# Patient Record
Sex: Female | Born: 1950 | Race: White | Hispanic: No | Smoking: Former smoker
Health system: Southern US, Community
[De-identification: ages and names within clinical notes are randomized; demographics above are authoritative.]

## PROBLEM LIST (undated history)

## (undated) DIAGNOSIS — G709 Myoneural disorder, unspecified: Secondary | ICD-10-CM

## (undated) DIAGNOSIS — I10 Essential (primary) hypertension: Secondary | ICD-10-CM

## (undated) DIAGNOSIS — C20 Malignant neoplasm of rectum: Secondary | ICD-10-CM

## (undated) DIAGNOSIS — E119 Type 2 diabetes mellitus without complications: Secondary | ICD-10-CM

## (undated) DIAGNOSIS — J449 Chronic obstructive pulmonary disease, unspecified: Secondary | ICD-10-CM

## (undated) HISTORY — DX: Malignant neoplasm of rectum: C20

## (undated) HISTORY — DX: Type 2 diabetes mellitus without complications: E11.9

---

## 2005-08-09 DIAGNOSIS — C801 Malignant (primary) neoplasm, unspecified: Secondary | ICD-10-CM

## 2005-08-09 HISTORY — DX: Malignant (primary) neoplasm, unspecified: C80.1

## 2017-08-09 HISTORY — PX: TUMOR REMOVAL: SHX12

## 2018-08-09 DIAGNOSIS — I219 Acute myocardial infarction, unspecified: Secondary | ICD-10-CM

## 2018-08-09 HISTORY — DX: Acute myocardial infarction, unspecified: I21.9

## 2020-07-23 LAB — LIPID PANEL
Cholesterol: 192 (ref 0–200)
HDL: 35 (ref 35–70)
Triglycerides: 526 — AB (ref 40–160)

## 2020-07-23 LAB — BASIC METABOLIC PANEL WITH GFR
BUN: 12 (ref 4–21)
Creatinine: 0.7 (ref 0.5–1.1)

## 2020-07-23 LAB — TSH: TSH: 0.68 (ref 0.41–5.90)

## 2020-07-23 LAB — MICROALBUMIN, URINE: Microalb, Ur: 8.3

## 2020-07-23 LAB — COMPREHENSIVE METABOLIC PANEL WITH GFR: Calcium: 10 (ref 8.7–10.7)

## 2020-07-23 LAB — HEMOGLOBIN A1C: Hemoglobin A1C: 8.2

## 2020-11-26 ENCOUNTER — Ambulatory Visit (INDEPENDENT_AMBULATORY_CARE_PROVIDER_SITE_OTHER): Payer: Medicare Other | Admitting: "Endocrinology

## 2020-11-26 ENCOUNTER — Encounter: Payer: Self-pay | Admitting: "Endocrinology

## 2020-11-26 ENCOUNTER — Other Ambulatory Visit: Payer: Self-pay

## 2020-11-26 VITALS — BP 110/78 | HR 80 | Ht 60.0 in | Wt 153.4 lb

## 2020-11-26 DIAGNOSIS — E1165 Type 2 diabetes mellitus with hyperglycemia: Secondary | ICD-10-CM

## 2020-11-26 DIAGNOSIS — E782 Mixed hyperlipidemia: Secondary | ICD-10-CM

## 2020-11-26 DIAGNOSIS — I10 Essential (primary) hypertension: Secondary | ICD-10-CM

## 2020-11-26 LAB — POCT GLYCOSYLATED HEMOGLOBIN (HGB A1C): HbA1c, POC (controlled diabetic range): 12.1 % — AB (ref 0.0–7.0)

## 2020-11-26 MED ORDER — GNP TRUE METRIX GLUCOSE STRIPS VI STRP: ORAL_STRIP | 2 refills | Status: DC

## 2020-11-26 NOTE — Patient Instructions (Signed)

## 2020-11-26 NOTE — Progress Notes (Signed)
Endocrinology Consult Note       11/26/2020, 4:53 PM   Subjective:    Patient ID: Kimberly Graves, female    DOB: [birth year].  Kimberly Graves is being seen in consultation for management of currently uncontrolled symptomatic diabetes requested by  Coolidge Breeze, FNP.   Past Medical History:  Diagnosis Date  . Diabetes mellitus, type II ([place])   . Rectal cancer [facility])     History reviewed. No pertinent surgical history.  Social History   Socioeconomic History  . Marital status: Married    Spouse name: Not on file  . Number of children: Not on file  . Years of education: Not on file  . Highest education level: Not on file  Occupational History  . Not on file  Tobacco Use  . Smoking status: Never Smoker  . Smokeless tobacco: Not on file  Vaping Use  . Vaping Use: Never used  Substance and Sexual Activity  . Alcohol use: Not Currently  . Drug use: Never  . Sexual activity: Not on file  Other Topics Concern  . Not on file  Social History Narrative  . Not on file   Social Determinants of Health   Financial Resource Strain: Not on file  Food Insecurity: Not on file  Transportation Needs: Not on file  Physical Activity: Not on file  Stress: Not on file  Social Connections: Not on file    Family History  Problem Relation Age of Onset  . Hypertension Mother   . Cancer Mother   . Hypertension Father     Outpatient Encounter Medications as of 11/26/2020  Medication Sig  . glucose blood (GNP TRUE METRIX GLUCOSE STRIPS) test strip Test 4 times a day  . acetaminophen (TYLENOL) 500 MG tablet Take 2 tablets by mouth 2 (two) times daily.  Marland Kitchen atorvastatin (LIPITOR) 80 MG tablet Take 1 tablet by mouth daily.  . busPIRone (BUSPAR) 10 MG tablet Take 10 mg by mouth 2 (two) times daily.  . carvedilol (COREG) 3.125 MG tablet Take 3.125 mg by mouth 2 (two) times daily.  . citalopram (CELEXA) 20 MG  tablet Take 20 mg by mouth daily.  . fenofibrate (TRICOR) 145 MG tablet Take 2 tablets by mouth daily.  Marland Kitchen gabapentin (NEURONTIN) 600 MG tablet Take 1 tablet by mouth 2 (two) times daily.  Marland Kitchen lisinopril (ZESTRIL) 5 MG tablet Take 5 mg by mouth daily.  . metFORMIN (GLUCOPHAGE-XR) 500 MG 24 hr tablet Take 500 mg by mouth daily.  . niacin 500 MG tablet Take 1 mg by mouth daily.  Marland Kitchen omeprazole (PRILOSEC) 20 MG capsule Take 1 capsule by mouth daily.  . pantoprazole (PROTONIX) 40 MG tablet Take by mouth 2 (two) times daily.  . TRULICITY 1.5 TH/4.3OO SOPN Inject 0.5 mLs into the skin every 7 (seven) days.   No facility-administered encounter medications on file as of 11/26/2020.    ALLERGIES: Allergies  Allergen Reactions  . Codeine   . Hydrocodone   . Lorcet [Hydrocodone-Acetaminophen]   . Percocet [Oxycodone-Acetaminophen]     VACCINATION STATUS:  There is no immunization history on file for this patient.  Diabetes  She presents for her initial diabetic visit. She has type 2 diabetes mellitus. Onset time: She was diagnosed at approximate age of 4 years. Her disease course has been worsening. There are no hypoglycemic associated symptoms. Associated symptoms include polydipsia and polyuria. There are no hypoglycemic complications. Symptoms are worsening. There are no diabetic complications. Risk factors for coronary artery disease include diabetes mellitus, dyslipidemia, sedentary lifestyle, post-menopausal and tobacco exposure. Current diabetic treatments: She is currently on metformin 500 mg p.o. twice daily, recently added Trulicity at 1.5 mg weekly. Her weight is fluctuating minimally. She is following a generally unhealthy diet. When asked about meal planning, she reported none. She has had a previous visit with a dietitian. She rarely participates in exercise. Her home blood glucose trend is increasing steadily. (Her point-of-care A1c is 12.1% consistent with uncontrolled chronic  hyperglycemia.  After initiation of Trulicity however, she is so improved glycemic profile.  Picture of her recent log on her phone shows fasting blood glucose ranging between 110-137.) An ACE inhibitor/angiotensin II receptor blocker is not being taken. She does not see a podiatrist.Eye exam is not current.  Hyperlipidemia This is a chronic problem. Exacerbating diseases include diabetes. Current antihyperlipidemic treatment includes fibric acid derivatives and statins. Risk factors for coronary artery disease include diabetes mellitus, dyslipidemia, hypertension, post-menopausal and a sedentary lifestyle.  Hypertension This is a chronic problem. The current episode started more than 1 year ago. Risk factors for coronary artery disease include diabetes mellitus and dyslipidemia. Past treatments include beta blockers.     Review of Systems  Endocrine: Positive for polydipsia and polyuria.    Objective:    Vitals with BMI 11/26/2020  Height 5\' 0"   Weight 153 lbs 6 oz  BMI 35.36  Systolic 144  Diastolic 78  Pulse 80    BP 110/78   Pulse 80   Ht 5' (1.524 m)   Wt 153 lb 6.4 oz (69.6 kg)   BMI 29.96 kg/m   Wt Readings from Last 3 Encounters:  11/26/20 153 lb 6.4 oz (69.6 kg)     Physical Exam    CMP ( most recent) CMP     Component Value Date/Time   BUN 12 07/23/2020 0000   CREATININE 0.7 07/23/2020 0000   CALCIUM 10.0 07/23/2020 0000     Diabetic Labs (most recent): Lab Results  Component Value Date   HGBA1C 12.1 (A) 11/26/2020   HGBA1C 8.2 07/23/2020    Lipid Panel     Component Value Date/Time   CHOL 192 07/23/2020 0000   TRIG 526 (A) 07/23/2020 0000   HDL 35 07/23/2020 0000      Assessment & Plan:   1. Type 2 diabetes mellitus with hyperglycemia, unspecified whether long term insulin use (HCC) - Kimberly Graves has currently uncontrolled symptomatic type 2 DM since  70 years of age,  with most recent A1c of 12.1 %. Recent labs reviewed. - I had a long  discussion with her about the progressive nature of diabetes and the pathology behind its complications. -She does not report gross complications from her diabetes, however she remains at a high risk for more acute and chronic complications which include CAD, CVA, CKD, retinopathy, and neuropathy. These are all discussed in detail with her.  - I have counseled her on diet  and weight management  by adopting a carbohydrate restricted/protein rich diet. Patient is encouraged to switch to  unprocessed or minimally processed     complex starch and increased protein intake (animal or  plant source), fruits, and vegetables. -  she is advised to stick to a routine mealtimes to eat 3 meals  a day and avoid unnecessary snacks ( to snack only to correct hypoglycemia).   - she acknowledges that there is a room for improvement in her food and drink choices. - Suggestion is made for her to avoid simple carbohydrates  from her diet including Cakes, Sweet Desserts, Ice Cream, Soda (diet and regular), Sweet Tea, Candies, Chips, Cookies, Store Bought Juices, Alcohol in Excess of  1-2 drinks a day, Artificial Sweeteners,  Coffee Creamer, and "Sugar-free" Products. This will help patient to have more stable blood glucose profile and potentially avoid unintended weight gain.  - she will be scheduled with Jearld Fenton, RDN, CDE for diabetes education.  - I have approached her with the following individualized plan to manage  her diabetes and patient agrees:   - she is supposed to initiate  strict monitoring of glucose 4 times a day-before meals and at bedtime. -Should be considered for at least basal insulin if she returns with significantly above target glycemic profile. - she is encouraged to call clinic for blood glucose levels less than 70 or above 300 mg /dl. -She seems to be benefiting from recent addition of Trulicity.  She is advised to continue Trulicity 1.5 mg subcutaneously weekly. -I discussed and increased  her metformin to 500 mg p.o. twice daily. -If she returns with only mild hyperglycemia, she will be considered for addition of low-dose glipizide in lieu of insulin. - Specific targets for  A1c;  LDL, HDL,  and Triglycerides were discussed with the patient.  2) Blood Pressure /Hypertension:  her blood pressure is  controlled to target.   she is advised to continue her current medications including carvedilol 3.25 mg p.o. twice daily . 3) Lipids/Hyperlipidemia:   Review of her recent lipid panel showed un controlled hypertriglyceridemia at 526.  She is currently on fenofibrate 145 mg p.o. daily, as well as atorvastatin 80 mg p.o. nightly.  She is advised to continue on both.   4)  Weight/Diet:  Body mass index is 29.96 kg/m.  -      she is  a candidate for some weight loss, recently experienced unintended weight loss.  I discussed with her the fact that loss of 5 - 10% of her  current body weight will have the most impact on her diabetes management.  Exercise, and detailed carbohydrates information provided  -  detailed on discharge instructions.  5) Chronic Care/Health Maintenance:  -she  is on  Statin medications and  is encouraged to initiate and continue to follow up with Ophthalmology, Dentist,  Podiatrist at least yearly or according to recommendations, and advised to   stay away from smoking. I have recommended yearly flu vaccine and pneumonia vaccine at least every 5 years; moderate intensity exercise for up to 150 minutes weekly; and  sleep for at least 7 hours a day.  - she is  advised to maintain close follow up with Coolidge Breeze, FNP for primary care needs, as well as her other providers for optimal and coordinated care.   I spent 65 minutes in the care of the patient today including review of labs from [place], Lipids, Thyroid Function, Hematology (current and previous including abstractions from other facilities); face-to-face time discussing  her blood glucose readings/logs, discussing  hypoglycemia and hyperglycemia episodes and symptoms, medications doses, her options of short and long term treatment based on the latest standards of  care / guidelines;  discussion about incorporating lifestyle medicine;  and documenting the encounter.     Please refer to Patient Instructions for Blood Glucose Monitoring and Insulin/Medications Dosing Guide"  in media tab for additional information. Please  also refer to " Patient Self Inventory" in the Media  tab for reviewed elements of pertinent patient history.  Kimberly Graves participated in the discussions, expressed understanding, and voiced agreement with the above plans.  All questions were answered to her satisfaction. she is encouraged to contact clinic should she have any questions or concerns prior to her return visit.   Follow up plan: - Return in about 10 days (around 12/06/2020) for F/U with Meter and Logs Only - no Labs.  Glade Lloyd, MD [facility] [address] [place], [place] [address] Phone: [phone]  Fax: [phone]    11/26/2020, 4:53 PM  This note was partially dictated with voice recognition software. Similar sounding words can be transcribed inadequately or may not  be corrected upon review.

## 2020-12-09 ENCOUNTER — Ambulatory Visit: Payer: Medicare Other | Admitting: "Endocrinology

## 2020-12-10 ENCOUNTER — Ambulatory Visit: Payer: Medicare Other | Admitting: "Endocrinology

## 2020-12-19 ENCOUNTER — Encounter: Payer: Self-pay | Admitting: "Endocrinology

## 2020-12-19 ENCOUNTER — Ambulatory Visit (INDEPENDENT_AMBULATORY_CARE_PROVIDER_SITE_OTHER): Payer: Medicare Other | Admitting: "Endocrinology

## 2020-12-19 ENCOUNTER — Other Ambulatory Visit: Payer: Self-pay

## 2020-12-19 VITALS — BP 124/81 | HR 70 | Ht 60.0 in | Wt 154.4 lb

## 2020-12-19 DIAGNOSIS — E782 Mixed hyperlipidemia: Secondary | ICD-10-CM | POA: Diagnosis not present

## 2020-12-19 DIAGNOSIS — I10 Essential (primary) hypertension: Secondary | ICD-10-CM

## 2020-12-19 DIAGNOSIS — E1165 Type 2 diabetes mellitus with hyperglycemia: Secondary | ICD-10-CM

## 2020-12-19 MED ORDER — ACCU-CHEK GUIDE ME W/DEVICE KIT: 1.0000 | PACK | 0 refills | Status: DC

## 2020-12-19 MED ORDER — ACCU-CHEK GUIDE VI STRP: ORAL_STRIP | 2 refills | Status: DC

## 2020-12-19 NOTE — Progress Notes (Signed)
12/19/2020, 4:54 PM  Endocrinology follow-up note   Subjective:    Patient ID: Kimberly Graves, female    DOB: 21-Nov-1950.  Kimberly Graves is being seen in follow-up after she was seen in consultation for management of currently uncontrolled symptomatic diabetes requested by  Coolidge Breeze, FNP.   Past Medical History:  Diagnosis Date  . Diabetes mellitus, type II ([place])   . Rectal cancer [facility])     History reviewed. No pertinent surgical history.  Social History   Socioeconomic History  . Marital status: Married    Spouse name: Not on file  . Number of children: Not on file  . Years of education: Not on file  . Highest education level: Not on file  Occupational History  . Not on file  Tobacco Use  . Smoking status: Never Smoker  . Smokeless tobacco: Not on file  Vaping Use  . Vaping Use: Never used  Substance and Sexual Activity  . Alcohol use: Not Currently  . Drug use: Never  . Sexual activity: Not on file  Other Topics Concern  . Not on file  Social History Narrative  . Not on file   Social Determinants of Health   Financial Resource Strain: Not on file  Food Insecurity: Not on file  Transportation Needs: Not on file  Physical Activity: Not on file  Stress: Not on file  Social Connections: Not on file    Family History  Problem Relation Age of Onset  . Hypertension Mother   . Cancer Mother   . Hypertension Father     Outpatient Encounter Medications as of 12/19/2020  Medication Sig  . Blood Glucose Monitoring Suppl (ACCU-CHEK GUIDE ME) w/Device KIT 1 Piece by Does not apply route as directed.  Marland Kitchen glucose blood (ACCU-CHEK GUIDE) test strip Use as instructed  . acetaminophen (TYLENOL) 500 MG tablet Take 2 tablets by mouth 2 (two) times daily.  Marland Kitchen atorvastatin (LIPITOR) 80 MG tablet Take 1 tablet by mouth daily.  . busPIRone (BUSPAR) 10 MG tablet Take 10 mg by mouth 2 (two)  times daily.  . carvedilol (COREG) 3.125 MG tablet Take 3.125 mg by mouth 2 (two) times daily.  . citalopram (CELEXA) 20 MG tablet Take 20 mg by mouth daily.  . fenofibrate (TRICOR) 145 MG tablet Take 2 tablets by mouth daily.  Marland Kitchen gabapentin (NEURONTIN) 600 MG tablet Take 1 tablet by mouth 2 (two) times daily.  Marland Kitchen lisinopril (ZESTRIL) 5 MG tablet Take 5 mg by mouth daily.  . metFORMIN (GLUCOPHAGE-XR) 500 MG 24 hr tablet Take 500 mg by mouth daily.  . niacin 500 MG tablet Take 1 mg by mouth daily.  Marland Kitchen omeprazole (PRILOSEC) 20 MG capsule Take 1 capsule by mouth daily.  . pantoprazole (PROTONIX) 40 MG tablet Take by mouth 2 (two) times daily.  . TRULICITY 1.5 XY/8.1VW SOPN Inject 0.5 mLs into the skin every 7 (seven) days.  . [DISCONTINUED] glucose blood (GNP TRUE METRIX GLUCOSE STRIPS) test strip Test 4 times a day   No facility-administered encounter medications on file as of 12/19/2020.    ALLERGIES: Allergies  Allergen Reactions  . Codeine   . Hydrocodone   .  Lorcet [Hydrocodone-Acetaminophen]   . Percocet [Oxycodone-Acetaminophen]     VACCINATION STATUS:  There is no immunization history on file for this patient.  Diabetes She presents for her follow-up diabetic visit. She has type 2 diabetes mellitus. Onset time: She was diagnosed at approximate age of 70 years. Her disease course has been improving. There are no hypoglycemic associated symptoms. Pertinent negatives for diabetes include no polydipsia and no polyuria. There are no hypoglycemic complications. Symptoms are worsening. There are no diabetic complications. Risk factors for coronary artery disease include diabetes mellitus, dyslipidemia, sedentary lifestyle, post-menopausal and tobacco exposure. Current diabetic treatments: She is currently on metformin 500 mg p.o. twice daily, recently added Trulicity at 1.5 mg weekly. Her weight is fluctuating minimally. She is following a generally unhealthy diet. When asked about meal  planning, she reported none. She has had a previous visit with a dietitian. She rarely participates in exercise. Her home blood glucose trend is decreasing steadily. Her breakfast blood glucose range is generally 130-140 mg/dl. Her bedtime blood glucose range is generally 130-140 mg/dl. Her overall blood glucose range is 130-140 mg/dl. (Despite her recent point-of-care A1c of 12.1%, she presents with near target glycemic profile averaging between 100 to 135 mg..  She has no hypoglycemia.  She is currently on metformin and Trulicity.   ) An ACE inhibitor/angiotensin II receptor blocker is not being taken. She does not see a podiatrist.Eye exam is not current.  Hyperlipidemia This is a chronic problem. Exacerbating diseases include diabetes. Current antihyperlipidemic treatment includes fibric acid derivatives and statins. Risk factors for coronary artery disease include diabetes mellitus, dyslipidemia, hypertension, post-menopausal and a sedentary lifestyle.  Hypertension This is a chronic problem. The current episode started more than 1 year ago. Risk factors for coronary artery disease include diabetes mellitus and dyslipidemia. Past treatments include beta blockers.     Review of Systems  Endocrine: Negative for polydipsia and polyuria.    Objective:    Vitals with BMI 12/19/2020 11/26/2020  Height _0  _1   Weight 154 lbs 6 oz 153 lbs 6 oz  BMI 22.97 98.92  Systolic 119 417  Diastolic 81 78  Pulse 70 80    BP 124/81   Pulse 70   Ht 5' (1.524 m)   Wt 154 lb 6.4 oz (70 kg)   BMI 30.15 kg/m   Wt Readings from Last 3 Encounters:  12/19/20 154 lb 6.4 oz (70 kg)  11/26/20 153 lb 6.4 oz (69.6 kg)     Physical Exam    CMP ( most recent) CMP     Component Value Date/Time   BUN 12 07/23/2020 0000   CREATININE 0.7 07/23/2020 0000   CALCIUM 10.0 07/23/2020 0000     Diabetic Labs (most recent): Lab Results  Component Value Date   HGBA1C 12.1 (A) 11/26/2020   HGBA1C 8.2  07/23/2020    Lipid Panel     Component Value Date/Time   CHOL 192 07/23/2020 0000   TRIG 526 (A) 07/23/2020 0000   HDL 35 07/23/2020 0000      Assessment & Plan:   1. Type 2 diabetes mellitus with hyperglycemia, unspecified whether long term insulin use (HCC) - Kimberly Graves has currently uncontrolled symptomatic type 2 DM since  70 years of age.  Despite her recent point-of-care A1c of 12.1%, she presents with near target glycemic profile averaging between 100 to 135 mg..  She has no hypoglycemia.  She is currently on metformin and Trulicity.    - Recent  labs reviewed. - I had a long discussion with her about the progressive nature of diabetes and the pathology behind its complications. -She does not report gross complications from her diabetes, however she remains at a high risk for more acute and chronic complications which include CAD, CVA, CKD, retinopathy, and neuropathy. These are all discussed in detail with her.  - I have counseled her on diet  and weight management  by adopting a carbohydrate restricted/protein rich diet. Patient is encouraged to switch to  unprocessed or minimally processed     complex starch and increased protein intake (animal or plant source), fruits, and vegetables. -  she is advised to stick to a routine mealtimes to eat 3 meals  a day and avoid unnecessary snacks ( to snack only to correct hypoglycemia).   - she acknowledges that there is a room for improvement in her food and drink choices. - Suggestion is made for her to avoid simple carbohydrates  from her diet including Cakes, Sweet Desserts, Ice Cream, Soda (diet and regular), Sweet Tea, Candies, Chips, Cookies, Store Bought Juices, Alcohol in Excess of  1-2 drinks a day, Artificial Sweeteners,  Coffee Creamer, and "Sugar-free" Products, Lemonade. This will help patient to have more stable blood glucose profile and potentially avoid unintended weight gain.   - she will be scheduled with Jearld Fenton, RDN, CDE for diabetes education.  - I have approached her with the following individualized plan to manage  her diabetes and patient agrees:   -In light of her presentation with near target glycemic profile, she will not need prandial insulin for now. -She is willing and advised to continue monitoring blood glucose twice a day-daily before breakfast and at bedtime. - she is encouraged to call clinic for blood glucose levels less than 70 or above 200 mg /dl. -She seems to be benefiting from recent addition of Trulicity.  She is advised to continue Trulicity 1.5 mg subcutaneously weekly, metformin 500 mg p.o. twice daily.   - Specific targets for  A1c;  LDL, HDL,  and Triglycerides were discussed with the patient.  2) Blood Pressure /Hypertension:  her blood pressure is  controlled to target.   she is advised to continue her current blood pressure medication including carvedilol 3.25 mg p.o. twice daily.     3) Lipids/Hyperlipidemia:   Review of her recent lipid panel showed un controlled hypertriglyceridemia at 526.  She is currently on fenofibrate 145 mg p.o. daily, as well as atorvastatin 80 mg p.o. nightly.   She is advised to continue on both.   4)  Weight/Diet:  Body mass index is 30.15 kg/m.  -      she is  a candidate for some weight loss, recently experienced unintended weight loss.  I discussed with her the fact that loss of 5 - 10% of her  current body weight will have the most impact on her diabetes management.  Exercise, and detailed carbohydrates information provided  -  detailed on discharge instructions.  5) Chronic Care/Health Maintenance:  -she  is on  Statin medications and  is encouraged to initiate and continue to follow up with Ophthalmology, Dentist,  Podiatrist at least yearly or according to recommendations, and advised to   stay away from smoking. I have recommended yearly flu vaccine and pneumonia vaccine at least every 5 years; moderate intensity exercise for  up to 150 minutes weekly; and  sleep for at least 7 hours a day.  - she is  advised  to maintain close follow up with Coolidge Breeze, FNP for primary care needs, as well as her other providers for optimal and coordinated care.    I spent 41 minutes in the care of the patient today including review of labs from [place], Lipids, Thyroid Function, Hematology (current and previous including abstractions from other facilities); face-to-face time discussing  her blood glucose readings/logs, discussing hypoglycemia and hyperglycemia episodes and symptoms, medications doses, her options of short and long term treatment based on the latest standards of care / guidelines;  discussion about incorporating lifestyle medicine;  and documenting the encounter.    Please refer to Patient Instructions for Blood Glucose Monitoring and Insulin/Medications Dosing Guide"  in media tab for additional information. Please  also refer to " Patient Self Inventory" in the Media  tab for reviewed elements of pertinent patient history.  Kimberly Graves participated in the discussions, expressed understanding, and voiced agreement with the above plans.  All questions were answered to her satisfaction. she is encouraged to contact clinic should she have any questions or concerns prior to her return visit.    Follow up plan: - Return in about 3 months (around 03/21/2021) for F/U with Pre-visit Labs, Meter, Logs, A1c here.Glade Lloyd, MD [facility] [address] [place], [place] [address] Phone: [phone]  Fax: [phone]    12/19/2020, 4:54 PM  This note was partially dictated with voice recognition software. Similar sounding words can be transcribed inadequately or may not  be corrected upon review.

## 2020-12-19 NOTE — Patient Instructions (Signed)

## 2021-03-23 ENCOUNTER — Encounter (INDEPENDENT_AMBULATORY_CARE_PROVIDER_SITE_OTHER): Payer: Self-pay | Admitting: *Deleted

## 2021-03-25 ENCOUNTER — Ambulatory Visit: Payer: Medicare Other | Admitting: "Endocrinology

## 2021-07-30 ENCOUNTER — Ambulatory Visit (INDEPENDENT_AMBULATORY_CARE_PROVIDER_SITE_OTHER): Payer: Medicare Other | Admitting: Gastroenterology

## 2021-08-09 DIAGNOSIS — Z85118 Personal history of other malignant neoplasm of bronchus and lung: Secondary | ICD-10-CM | POA: Insufficient documentation

## 2021-10-24 ENCOUNTER — Encounter (HOSPITAL_COMMUNITY): Payer: Self-pay

## 2021-10-24 ENCOUNTER — Other Ambulatory Visit: Payer: Self-pay

## 2021-10-24 ENCOUNTER — Emergency Department (HOSPITAL_COMMUNITY): Payer: Medicare Other

## 2021-10-24 ENCOUNTER — Observation Stay (HOSPITAL_COMMUNITY)
Admission: EM | Admit: 2021-10-24 | Discharge: 2021-10-25 | Disposition: A | Discharge status: Home / Self Care | Payer: Medicare Other | Attending: Internal Medicine | Admitting: Internal Medicine

## 2021-10-24 DIAGNOSIS — E860 Dehydration: Secondary | ICD-10-CM | POA: Diagnosis not present

## 2021-10-24 DIAGNOSIS — W0110XA Fall on same level from slipping, tripping and stumbling with subsequent striking against unspecified object, initial encounter: Secondary | ICD-10-CM | POA: Insufficient documentation

## 2021-10-24 DIAGNOSIS — R55 Syncope and collapse: Secondary | ICD-10-CM | POA: Diagnosis present

## 2021-10-24 DIAGNOSIS — Z79899 Other long term (current) drug therapy: Secondary | ICD-10-CM | POA: Insufficient documentation

## 2021-10-24 DIAGNOSIS — Z85048 Personal history of other malignant neoplasm of rectum, rectosigmoid junction, and anus: Secondary | ICD-10-CM | POA: Insufficient documentation

## 2021-10-24 DIAGNOSIS — R0789 Other chest pain: Secondary | ICD-10-CM | POA: Insufficient documentation

## 2021-10-24 DIAGNOSIS — I10 Essential (primary) hypertension: Secondary | ICD-10-CM | POA: Diagnosis present

## 2021-10-24 DIAGNOSIS — Z7901 Long term (current) use of anticoagulants: Secondary | ICD-10-CM | POA: Insufficient documentation

## 2021-10-24 DIAGNOSIS — Z20822 Contact with and (suspected) exposure to covid-19: Secondary | ICD-10-CM | POA: Diagnosis not present

## 2021-10-24 DIAGNOSIS — R079 Chest pain, unspecified: Secondary | ICD-10-CM

## 2021-10-24 DIAGNOSIS — N390 Urinary tract infection, site not specified: Secondary | ICD-10-CM | POA: Diagnosis not present

## 2021-10-24 DIAGNOSIS — F39 Unspecified mood [affective] disorder: Secondary | ICD-10-CM | POA: Diagnosis not present

## 2021-10-24 DIAGNOSIS — Z7984 Long term (current) use of oral hypoglycemic drugs: Secondary | ICD-10-CM | POA: Insufficient documentation

## 2021-10-24 DIAGNOSIS — E1165 Type 2 diabetes mellitus with hyperglycemia: Secondary | ICD-10-CM | POA: Diagnosis not present

## 2021-10-24 DIAGNOSIS — Y9389 Activity, other specified: Secondary | ICD-10-CM | POA: Insufficient documentation

## 2021-10-24 DIAGNOSIS — Z7982 Long term (current) use of aspirin: Secondary | ICD-10-CM | POA: Insufficient documentation

## 2021-10-24 DIAGNOSIS — E782 Mixed hyperlipidemia: Secondary | ICD-10-CM | POA: Diagnosis present

## 2021-10-24 LAB — COMPREHENSIVE METABOLIC PANEL
ALT: 23 U/L (ref 0–44)
AST: 59 U/L — ABNORMAL HIGH (ref 15–41)
Albumin: 3.9 g/dL (ref 3.5–5.0)
Alkaline Phosphatase: 46 U/L (ref 38–126)
Anion gap: 10 (ref 5–15)
BUN: 21 mg/dL (ref 8–23)
CO2: 22 mmol/L (ref 22–32)
Calcium: 9.5 mg/dL (ref 8.9–10.3)
Chloride: 104 mmol/L (ref 98–111)
Creatinine, Ser: 1.07 mg/dL — ABNORMAL HIGH (ref 0.44–1.00)
GFR, Estimated: 55 mL/min — ABNORMAL LOW (ref >60)
Glucose, Bld: 342 mg/dL — ABNORMAL HIGH (ref 70–99)
Potassium: 4.4 mmol/L (ref 3.5–5.1)
Sodium: 136 mmol/L (ref 135–145)
Total Bilirubin: 0.4 mg/dL (ref 0.3–1.2)
Total Protein: 6.8 g/dL (ref 6.5–8.1)

## 2021-10-24 LAB — CBC WITH DIFFERENTIAL/PLATELET
Abs Immature Granulocytes: 0.03 10*3/uL (ref 0.00–0.07)
Basophils Absolute: 0 10*3/uL (ref 0.0–0.1)
Basophils Relative: 0 %
Eosinophils Absolute: 0 10*3/uL (ref 0.0–0.5)
Eosinophils Relative: 1 %
HCT: 35 % — ABNORMAL LOW (ref 36.0–46.0)
Hemoglobin: 10.7 g/dL — ABNORMAL LOW (ref 12.0–15.0)
Immature Granulocytes: 1 %
Lymphocytes Relative: 13 %
Lymphs Abs: 0.8 10*3/uL (ref 0.7–4.0)
MCH: 27.2 pg (ref 26.0–34.0)
MCHC: 30.6 g/dL (ref 30.0–36.0)
MCV: 88.8 fL (ref 80.0–100.0)
Monocytes Absolute: 0.2 10*3/uL (ref 0.1–1.0)
Monocytes Relative: 2 %
Neutro Abs: 5.2 10*3/uL (ref 1.7–7.7)
Neutrophils Relative %: 83 %
Platelets: 271 10*3/uL (ref 150–400)
RBC: 3.94 MIL/uL (ref 3.87–5.11)
RDW: 15.2 % (ref 11.5–15.5)
WBC: 6.3 10*3/uL (ref 4.0–10.5)
nRBC: 0 % (ref 0.0–0.2)

## 2021-10-24 LAB — TROPONIN I (HIGH SENSITIVITY)
Troponin I (High Sensitivity): 2 ng/L (ref <18)
Troponin I (High Sensitivity): <2 ng/L (ref <18)

## 2021-10-24 LAB — D-DIMER, QUANTITATIVE: D-Dimer, Quant: 0.45 ug/mL-FEU (ref 0.00–0.50)

## 2021-10-24 MED ORDER — PANTOPRAZOLE SODIUM 40 MG PO TBEC
40.0000 mg | DELAYED_RELEASE_TABLET | Freq: Two times a day (BID) | ORAL | Status: DC
  Administered 2021-10-25 (×2): 40 mg via ORAL
  Filled 2021-10-24 (×2): qty 1

## 2021-10-24 MED ORDER — SODIUM CHLORIDE 0.9 % IV BOLUS
1000.0000 mL | Freq: Once | INTRAVENOUS | Status: AC
  Administered 2021-10-24: 1000 mL via INTRAVENOUS

## 2021-10-24 MED ORDER — ACETAMINOPHEN 325 MG PO TABS: 650.0000 mg | ORAL_TABLET | Freq: Four times a day (QID) | ORAL | Status: DC | PRN

## 2021-10-24 MED ORDER — FENOFIBRATE 54 MG PO TABS
54.0000 mg | ORAL_TABLET | Freq: Every day | ORAL | Status: DC
Start: 2021-10-25 — End: 2021-10-25
  Administered 2021-10-25: 54 mg via ORAL
  Filled 2021-10-24: qty 1

## 2021-10-24 MED ORDER — BUSPIRONE HCL 5 MG PO TABS
10.0000 mg | ORAL_TABLET | Freq: Two times a day (BID) | ORAL | Status: DC
  Administered 2021-10-25 (×2): 10 mg via ORAL
  Filled 2021-10-24 (×2): qty 2

## 2021-10-24 MED ORDER — POLYETHYLENE GLYCOL 3350 17 G PO PACK: 17.0000 g | PACK | Freq: Every day | ORAL | Status: DC | PRN

## 2021-10-24 MED ORDER — LISINOPRIL 5 MG PO TABS
5.0000 mg | ORAL_TABLET | Freq: Every day | ORAL | Status: DC
  Administered 2021-10-25: 5 mg via ORAL
  Filled 2021-10-24: qty 1

## 2021-10-24 MED ORDER — INSULIN ASPART 100 UNIT/ML IJ SOLN: 0.0000 [IU] | Freq: Three times a day (TID) | INTRAMUSCULAR | Status: DC

## 2021-10-24 MED ORDER — ASPIRIN 81 MG PO CHEW
81.0000 mg | CHEWABLE_TABLET | Freq: Every day | ORAL | Status: DC
  Administered 2021-10-25: 81 mg via ORAL
  Filled 2021-10-24: qty 1

## 2021-10-24 MED ORDER — CITALOPRAM HYDROBROMIDE 20 MG PO TABS
20.0000 mg | ORAL_TABLET | Freq: Every day | ORAL | Status: DC
  Administered 2021-10-25: 20 mg via ORAL
  Filled 2021-10-24: qty 1

## 2021-10-24 MED ORDER — ACETAMINOPHEN 650 MG RE SUPP: 650.0000 mg | Freq: Four times a day (QID) | RECTAL | Status: DC | PRN

## 2021-10-24 MED ORDER — GABAPENTIN 600 MG PO TABS
600.0000 mg | ORAL_TABLET | Freq: Two times a day (BID) | ORAL | Status: DC
  Filled 2021-10-24 (×4): qty 1

## 2021-10-24 MED ORDER — HEPARIN SODIUM (PORCINE) 5000 UNIT/ML IJ SOLN
5000.0000 [IU] | Freq: Three times a day (TID) | INTRAMUSCULAR | Status: DC
  Administered 2021-10-25 (×2): 5000 [IU] via SUBCUTANEOUS
  Filled 2021-10-24 (×2): qty 1

## 2021-10-24 MED ORDER — MORPHINE SULFATE (PF) 2 MG/ML IV SOLN: 2.0000 mg | INTRAVENOUS | Status: DC | PRN

## 2021-10-24 MED ORDER — METHOCARBAMOL 1000 MG/10ML IJ SOLN: 500.0000 mg | Freq: Four times a day (QID) | INTRAVENOUS | Status: DC | PRN

## 2021-10-24 MED ORDER — ATORVASTATIN CALCIUM 40 MG PO TABS
80.0000 mg | ORAL_TABLET | Freq: Every day | ORAL | Status: DC
  Administered 2021-10-25: 80 mg via ORAL
  Filled 2021-10-24: qty 2

## 2021-10-24 MED ORDER — LEVETIRACETAM 500 MG PO TABS
500.0000 mg | ORAL_TABLET | Freq: Two times a day (BID) | ORAL | Status: DC
  Administered 2021-10-25 (×2): 500 mg via ORAL
  Filled 2021-10-24 (×2): qty 1

## 2021-10-24 MED ORDER — ONDANSETRON HCL 4 MG/2ML IJ SOLN: 4.0000 mg | Freq: Four times a day (QID) | INTRAMUSCULAR | Status: DC | PRN

## 2021-10-24 MED ORDER — ONDANSETRON HCL 4 MG PO TABS: 4.0000 mg | ORAL_TABLET | Freq: Four times a day (QID) | ORAL | Status: DC | PRN

## 2021-10-24 MED ORDER — CARVEDILOL 3.125 MG PO TABS
3.1250 mg | ORAL_TABLET | Freq: Two times a day (BID) | ORAL | Status: DC
  Administered 2021-10-25 (×2): 3.125 mg via ORAL
  Filled 2021-10-24 (×2): qty 1

## 2021-10-24 NOTE — ED Triage Notes (Signed)
Pt arrives via EMS from home. Pt was bending over putting dishes away, at that time she became dizzy and fell back and hit the dishwasher. No loc, alert and x 4, no blood thinners. Pt also c/o chest pain that started when ems arrived to bring her to the hospital.  ?

## 2021-10-24 NOTE — ED Provider Notes (Signed)
?[place] ?Provider Note ? ? ?CSN: [id number] ?Arrival date & time: 10/24/21  1722 ? ?  ? ?History ? ?Chief Complaint  ?Patient presents with  ? Fall  ? ? ?Kimberly Graves is a 71 y.o. female. ? ?Patient had an episode of dizziness and fell back hit her head and was confused for 20 minutes she also has some chest discomfort and was diaphoretic with some blueness to her lips according to the family.  Patient has past medical history of diabetes ? ?The history is provided by the patient and medical records. No language interpreter was used.  ?Fall ?This is a new problem. The current episode started 6 to 12 hours ago. The problem occurs rarely. The problem has been resolved. Pertinent negatives include no chest pain, no abdominal pain and no headaches. Nothing aggravates the symptoms. She has tried nothing for the symptoms.  ? ?  ? ?Home Medications ?Prior to Admission medications   ?Medication Sig Start Date End Date Taking? Authorizing Provider  ?acetaminophen (TYLENOL) 500 MG tablet Take 2 tablets by mouth 2 (two) times daily.    [provider]  ?atorvastatin (LIPITOR) 80 MG tablet Take 1 tablet by mouth daily. 11/22/20   [provider]  ?Blood Glucose Monitoring Suppl (ACCU-CHEK GUIDE ME) w/Device KIT 1 Piece by Does not apply route as directed. 12/19/20   Cassandria Anger, MD  ?busPIRone (BUSPAR) 10 MG tablet Take 10 mg by mouth 2 (two) times daily. 11/01/20   [provider]  ?carvedilol (COREG) 3.125 MG tablet Take 3.125 mg by mouth 2 (two) times daily. 11/01/20   [provider]  ?citalopram (CELEXA) 20 MG tablet Take 20 mg by mouth daily. 11/01/20   [provider]  ?fenofibrate (TRICOR) 145 MG tablet Take 2 tablets by mouth daily.    [provider]  ?gabapentin (NEURONTIN) 600 MG tablet Take 1 tablet by mouth 2 (two) times daily.    [provider]  ?glucose blood (ACCU-CHEK GUIDE) test strip Use as instructed 12/19/20    Cassandria Anger, MD  ?lisinopril (ZESTRIL) 5 MG tablet Take 5 mg by mouth daily. 11/08/20   [provider]  ?metFORMIN (GLUCOPHAGE-XR) 500 MG 24 hr tablet Take 500 mg by mouth daily. 10/09/20   [provider]  ?niacin 500 MG tablet Take 1 mg by mouth daily.    [provider]  ?omeprazole (PRILOSEC) 20 MG capsule Take 1 capsule by mouth daily.    [provider]  ?pantoprazole (PROTONIX) 40 MG tablet Take by mouth 2 (two) times daily.    [provider]  ?TRULICITY 1.5 WK/0.8UP SOPN Inject 0.5 mLs into the skin every 7 (seven) days. 11/05/20   [provider]  ?   ? ?Allergies    ?Codeine, Hydrocodone, Lorcet [hydrocodone-acetaminophen], and Percocet [oxycodone-acetaminophen]   ? ?Review of Systems   ?Review of Systems  ?Constitutional:  Negative for appetite change and fatigue.  ?HENT:  Negative for congestion, ear discharge and sinus pressure.   ?Eyes:  Negative for discharge.  ?Respiratory:  Negative for cough.   ?Cardiovascular:  Negative for chest pain.  ?Gastrointestinal:  Negative for abdominal pain and diarrhea.  ?Genitourinary:  Negative for frequency and hematuria.  ?Musculoskeletal:  Negative for back pain.  ?Skin:  Negative for rash.  ?Neurological:  Positive for syncope. Negative for seizures and headaches.  ?Psychiatric/Behavioral:  Negative for hallucinations.   ? ?Physical Exam ?Updated Vital Signs ?BP 102/63   Pulse 84  Temp 98.2 ?F (36.8 ?C)   Resp (!) 24   Ht 5' (1.524 m)   Wt 75.3 kg   SpO2 97%   BMI 32.42 kg/m?  ?Physical Exam ?Vitals and nursing note reviewed.  ?Constitutional:   ?   Appearance: She is well-developed.  ?HENT:  ?   Head: Normocephalic.  ?   Nose: Nose normal.  ?Eyes:  ?   General: No scleral icterus. ?   Conjunctiva/sclera: Conjunctivae normal.  ?Neck:  ?   Thyroid: No thyromegaly.  ?Cardiovascular:  ?   Rate and Rhythm: Normal rate and regular rhythm.  ?   Heart sounds: No murmur heard. ?  No friction rub. No  gallop.  ?Pulmonary:  ?   Breath sounds: No stridor. No wheezing or rales.  ?Chest:  ?   Chest wall: No tenderness.  ?Abdominal:  ?   General: There is no distension.  ?   Tenderness: There is no abdominal tenderness. There is no rebound.  ?Musculoskeletal:     ?   General: Normal range of motion.  ?   Cervical back: Neck supple.  ?Lymphadenopathy:  ?   Cervical: No cervical adenopathy.  ?Skin: ?   Findings: No erythema or rash.  ?Neurological:  ?   Mental Status: She is alert and oriented to person, place, and time.  ?   Motor: No abnormal muscle tone.  ?   Coordination: Coordination normal.  ?Psychiatric:     ?   Behavior: Behavior normal.  ? ? ?ED Results / Procedures / Treatments   ?Labs ?(all labs ordered are listed, but only abnormal results are displayed) ?Labs Reviewed  ?CBC WITH DIFFERENTIAL/PLATELET - Abnormal; Notable for the following components:  ?    Result Value  ? Hemoglobin 10.7 (*)   ? HCT 35.0 (*)   ? All other components within normal limits  ?COMPREHENSIVE METABOLIC PANEL - Abnormal; Notable for the following components:  ? Glucose, Bld 342 (*)   ? Creatinine, Ser 1.07 (*)   ? AST 59 (*)   ? GFR, Estimated 55 (*)   ? All other components within normal limits  ?D-DIMER, QUANTITATIVE  ?URINALYSIS, ROUTINE W REFLEX MICROSCOPIC  ?TROPONIN I (HIGH SENSITIVITY)  ?TROPONIN I (HIGH SENSITIVITY)  ? ? ?EKG ?EKG Interpretation ? ?Date/Time:  Saturday October 24 2021 17:38:02 EDT ?Ventricular Rate:  87 ?PR Interval:  204 ?QRS Duration: 97 ?QT Interval:  377 ?QTC Calculation: 096 ?R Axis:   46 ?Text Interpretation: Sinus rhythm Confirmed by Milton Ferguson [phone] on 10/24/2021 6:50:35 PM ? ?Radiology ?CT Head Wo Contrast ? ?Result Date: 10/24/2021 ?CLINICAL DATA:  Head trauma, intracranial arterial injury suspected EXAM: CT HEAD WITHOUT CONTRAST TECHNIQUE: Contiguous axial images were obtained from the base of the skull through the vertex without intravenous contrast. RADIATION DOSE REDUCTION: This exam was  performed according to the departmental dose-optimization program which includes automated exposure control, adjustment of the mA and/or kV according to patient size and/or use of iterative reconstruction technique. COMPARISON:  None. FINDINGS: Brain: No evidence of acute infarction, hemorrhage, hydrocephalus, extra-axial collection or mass lesion/mass effect. Patchy white matter hypoattenuation, nonspecific but compatible with chronic microvascular ischemic disease. Cerebral atrophy. Vascular: No evidence of a hyperdense vessel. Calcific intracranial atherosclerosis. Skull: No acute fracture. Sinuses/Orbits: Visualized sinuses are clear. No acute orbital findings. Other: No mastoid effusions. IMPRESSION: 1. No evidence of acute intracranial abnormality. 2. Chronic microvascular disease and cerebral atrophy. Electronically Signed   By: Margaretha Sheffield M.D.   On: 10/24/2021  19:51  ? ?CT Cervical Spine Wo Contrast ? ?Result Date: 10/24/2021 ?CLINICAL DATA:  Ataxia, cervical trauma. EXAM: CT CERVICAL SPINE WITHOUT CONTRAST TECHNIQUE: Multidetector CT imaging of the cervical spine was performed without intravenous contrast. Multiplanar CT image reconstructions were also generated. RADIATION DOSE REDUCTION: This exam was performed according to the departmental dose-optimization program which includes automated exposure control, adjustment of the mA and/or kV according to patient size and/or use of iterative reconstruction technique. COMPARISON:  None. FINDINGS: Alignment: Normal. Skull base and vertebrae: No acute fracture. No primary bone lesion or focal pathologic process. Soft tissues and spinal canal: No prevertebral fluid or swelling. No visible canal hematoma. Disc levels: There is disc space narrowing at C5-C6 with endplate osteophyte formation compatible with degenerative change. There is mild left-sided neural foraminal stenosis at this level secondary to uncovertebral spurring. There is also disc bulge at this  level causing mild central canal stenosis. Upper chest: There is scarring in the lung apices. Other: There are atherosclerotic calcifications of the bilateral carotid artery bifurcations. IMPRESSION: No acute fracture

## 2021-10-25 ENCOUNTER — Observation Stay (HOSPITAL_COMMUNITY): Payer: Medicare Other

## 2021-10-25 ENCOUNTER — Observation Stay (HOSPITAL_BASED_OUTPATIENT_CLINIC_OR_DEPARTMENT_OTHER): Payer: Medicare Other

## 2021-10-25 ENCOUNTER — Other Ambulatory Visit (HOSPITAL_COMMUNITY): Payer: Self-pay | Admitting: *Deleted

## 2021-10-25 DIAGNOSIS — E860 Dehydration: Secondary | ICD-10-CM | POA: Diagnosis present

## 2021-10-25 DIAGNOSIS — R55 Syncope and collapse: Secondary | ICD-10-CM

## 2021-10-25 DIAGNOSIS — I10 Essential (primary) hypertension: Secondary | ICD-10-CM

## 2021-10-25 DIAGNOSIS — E782 Mixed hyperlipidemia: Secondary | ICD-10-CM | POA: Diagnosis not present

## 2021-10-25 DIAGNOSIS — R079 Chest pain, unspecified: Secondary | ICD-10-CM | POA: Diagnosis not present

## 2021-10-25 DIAGNOSIS — E1165 Type 2 diabetes mellitus with hyperglycemia: Secondary | ICD-10-CM

## 2021-10-25 DIAGNOSIS — N390 Urinary tract infection, site not specified: Secondary | ICD-10-CM

## 2021-10-25 DIAGNOSIS — F39 Unspecified mood [affective] disorder: Secondary | ICD-10-CM

## 2021-10-25 LAB — ECHOCARDIOGRAM COMPLETE
AR max vel: 1.66 cm2
AV Area VTI: 1.71 cm2
AV Area mean vel: 1.64 cm2
AV Mean grad: 5 mmHg
AV Peak grad: 8.8 mmHg
Ao pk vel: 1.48 m/s
Area-P 1/2: 3.31 cm2
Height: 60 in
S' Lateral: 2.3 cm
Weight: 2656 oz

## 2021-10-25 LAB — COMPREHENSIVE METABOLIC PANEL
ALT: 20 U/L (ref 0–44)
AST: 39 U/L (ref 15–41)
Albumin: 3.6 g/dL (ref 3.5–5.0)
Alkaline Phosphatase: 43 U/L (ref 38–126)
Anion gap: 10 (ref 5–15)
BUN: 17 mg/dL (ref 8–23)
CO2: 25 mmol/L (ref 22–32)
Calcium: 9.6 mg/dL (ref 8.9–10.3)
Chloride: 108 mmol/L (ref 98–111)
Creatinine, Ser: 0.71 mg/dL (ref 0.44–1.00)
GFR, Estimated: >60 mL/min (ref >60)
Glucose, Bld: 99 mg/dL (ref 70–99)
Potassium: 3.9 mmol/L (ref 3.5–5.1)
Sodium: 143 mmol/L (ref 135–145)
Total Bilirubin: 0.4 mg/dL (ref 0.3–1.2)
Total Protein: 6.5 g/dL (ref 6.5–8.1)

## 2021-10-25 LAB — CBC WITH DIFFERENTIAL/PLATELET
Abs Immature Granulocytes: 0.05 10*3/uL (ref 0.00–0.07)
Basophils Absolute: 0 10*3/uL (ref 0.0–0.1)
Basophils Relative: 0 %
Eosinophils Absolute: 0.1 10*3/uL (ref 0.0–0.5)
Eosinophils Relative: 1 %
HCT: 34.9 % — ABNORMAL LOW (ref 36.0–46.0)
Hemoglobin: 10.5 g/dL — ABNORMAL LOW (ref 12.0–15.0)
Immature Granulocytes: 1 %
Lymphocytes Relative: 27 %
Lymphs Abs: 1.8 10*3/uL (ref 0.7–4.0)
MCH: 26.3 pg (ref 26.0–34.0)
MCHC: 30.1 g/dL (ref 30.0–36.0)
MCV: 87.3 fL (ref 80.0–100.0)
Monocytes Absolute: 0.5 10*3/uL (ref 0.1–1.0)
Monocytes Relative: 8 %
Neutro Abs: 4.2 10*3/uL (ref 1.7–7.7)
Neutrophils Relative %: 63 %
Platelets: 262 10*3/uL (ref 150–400)
RBC: 4 MIL/uL (ref 3.87–5.11)
RDW: 15.1 % (ref 11.5–15.5)
WBC: 6.6 10*3/uL (ref 4.0–10.5)
nRBC: 0 % (ref 0.0–0.2)

## 2021-10-25 LAB — URINALYSIS, ROUTINE W REFLEX MICROSCOPIC
Bacteria, UA: NONE SEEN
Bilirubin Urine: NEGATIVE
Glucose, UA: >=500 mg/dL — AB
Hgb urine dipstick: NEGATIVE
Ketones, ur: NEGATIVE mg/dL
Nitrite: NEGATIVE
Protein, ur: NEGATIVE mg/dL
Specific Gravity, Urine: 1.016 (ref 1.005–1.030)
pH: 5 (ref 5.0–8.0)

## 2021-10-25 LAB — PROCALCITONIN: Procalcitonin: <0.1 ng/mL

## 2021-10-25 LAB — HEMOGLOBIN A1C
Hgb A1c MFr Bld: 6.7 % — ABNORMAL HIGH (ref 4.8–5.6)
Mean Plasma Glucose: 145.59 mg/dL

## 2021-10-25 LAB — CBG MONITORING, ED
Glucose-Capillary: 98 mg/dL (ref 70–99)
Glucose-Capillary: 168 mg/dL — ABNORMAL HIGH (ref 70–99)

## 2021-10-25 LAB — RESP PANEL BY RT-PCR (FLU A&B, COVID) ARPGX2
Influenza A by PCR: NEGATIVE
Influenza B by PCR: NEGATIVE
SARS Coronavirus 2 by RT PCR: NEGATIVE

## 2021-10-25 LAB — MAGNESIUM: Magnesium: 1.6 mg/dL — ABNORMAL LOW (ref 1.7–2.4)

## 2021-10-25 MED ORDER — GABAPENTIN 300 MG PO CAPS
600.0000 mg | ORAL_CAPSULE | Freq: Two times a day (BID) | ORAL | Status: DC
Start: 2021-10-25 — End: 2021-10-25
  Administered 2021-10-25 (×2): 600 mg via ORAL
  Filled 2021-10-25 (×2): qty 2

## 2021-10-25 MED ORDER — MAGNESIUM SULFATE 4 GM/100ML IV SOLN
4.0000 g | Freq: Once | INTRAVENOUS | Status: AC
  Administered 2021-10-25: 4 g via INTRAVENOUS
  Filled 2021-10-25: qty 100

## 2021-10-25 MED ORDER — SODIUM CHLORIDE 0.9 % IV SOLN
2.0000 g | INTRAVENOUS | Status: DC
  Administered 2021-10-25: 2 g via INTRAVENOUS
  Filled 2021-10-25: qty 20

## 2021-10-25 MED ORDER — CEFPODOXIME PROXETIL 100 MG PO TABS: 100.0000 mg | ORAL_TABLET | Freq: Two times a day (BID) | ORAL | 0 refills | Status: DC

## 2021-10-25 MED ORDER — LOPERAMIDE HCL 2 MG PO CAPS
2.0000 mg | ORAL_CAPSULE | ORAL | Status: DC | PRN
  Administered 2021-10-25: 2 mg via ORAL
  Filled 2021-10-25: qty 1

## 2021-10-25 MED ORDER — CEFDINIR 300 MG PO CAPS: 300.0000 mg | ORAL_CAPSULE | Freq: Two times a day (BID) | ORAL | 0 refills | Status: AC

## 2021-10-25 MED ORDER — LISINOPRIL 5 MG PO TABS: 5.0000 mg | ORAL_TABLET | Freq: Every day | ORAL | Status: DC

## 2021-10-25 MED ORDER — SODIUM CHLORIDE 0.9 % IV SOLN: INTRAVENOUS | Status: DC

## 2021-10-25 NOTE — Assessment & Plan Note (Addendum)
-  Patient maintained on home regimen Coreg.   ?-Lisinopril held and will be resumed 3 days post discharge.   ?

## 2021-10-25 NOTE — Discharge Summary (Addendum)
Physician Discharge Summary  ?Kimberly Graves JAS:[id number] DOB: 06-18-1951 DOA: 10/24/2021 ? ?PCP: Kimberly Breeze, FNP ? ?Admit date: 10/24/2021 ?Discharge date: 10/25/2021 ? ?Time spent: 55 minutes ? ?Recommendations for Outpatient Follow-up:  ?Follow-up with Kimberly Breeze, FNP in 3 days.  On follow-up patient will need a basic metabolic profile, magnesium level done to follow-up on electrolytes and renal function.  Urine cultures done during this admission will need to be followed up upon.  Patient may benefit from outpatient referral to cardiology to be set up for event monitor. ? ? ?Discharge Diagnoses:  ?Principal Problem: ?  Syncope ?Active Problems: ?  Type 2 diabetes mellitus with hyperglycemia ([place]) ?  Mixed hyperlipidemia ?  Essential hypertension, benign ?  Chest pain ?  Mood disorder ([place]) ?  UTI (urinary tract infection) ?  Hypomagnesemia ?  Dehydration ? ? ?Discharge Condition: Stable and improved ? ?Diet recommendation: Carb modified diet ? ?Filed Weights  ? 10/24/21 1731  ?Weight: 75.3 kg  ? ? ?History of present illness:  ?HPI per Kimberly Graves ? Kimberly Graves is a 71 y.o. female with medical history significant of history of diabetes mellitus type 2, GERD, hyperlipidemia, history of seizure,, and more presents to the ED with a chief complaint of fall.  Patient reports that she had just gotten doing dishes, putting pots and pans away.  She was kneeling on the floor when she said realize she was falling backwards.  She tried to catch herself on the cabinet door and gently laid herself down.  She thought she had already hit the floor but had not, so her head hit the dishwasher rack pretty hard on the way down.  Patient reports that she does not think she had loss of consciousness, but her daughter does think she had it.  Patient says when she has had syncope in the past she has thrown up, so since she did not throw up this time does not think she had an episode of syncope.  Patient does report that  she was dizzy prior to falling.  She has vertigo, and this felt like her normal dizziness.  She little bit of a headache before she fell, but no changes in vision, no changes in hearing.  No chest pain.  Reports she feels little short of breath during the episode as well.  At home she is supposed to wear 3 L nasal cannula at night.  Patient reports no history of heart problems that she knows of.  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  She reports several tests were run and she was diagnosed as having broken heart syndrome.  Patient has no other complaints at this time.   ?  ?Patient does not smoke, does not drink, is vaccinated for COVID.  Patient is full code. ? ?Hospital Course:  ? ?Assessment and Plan: ?* Syncope ? ?Patient reports that she did not have loss of consciousness, but that her daughter said she did ?Patient admits to having significant anxiety around the event. ?-Patient noted on mission to have borderline blood pressure of 102/63. ?-Orthostatics were checked which were negative. ?-CT head CT C-spine done was unremarkable for any acute abnormalities. ?-Carotid Dopplers done with no significant ICA stenosis. ?-2D echo done with EF of 60 to 65%,NWMA, mitral valve grossly normal, right ventricular systolic function was normal, no evidence of aortic stenosis noted. ?-D-dimer was negative. ?-Patient noted to be clinically dehydrated and placed on IV fluids. ?-Patient improved clinically  did not have any further episodes. ?-Patient and family advised might be prudent to hydrate for another 24 hours however patient and family insistent on being discharged stating they will keep patient well-hydrated at home if that was the main thing needed. ?-No arrhythmias noted on telemetry. ?-Patient was discharged home in stable and improved condition. ?-We will need close outpatient follow-up with PCP in the next 3 days for follow-up. ?-Patient may benefit from event monitor in  the outpatient setting however will defer to PCP. ? ?Dehydration ?- Patient placed on IV fluids. ? ?Hypomagnesemia ?- Magnesium noted at 1.6. ?-Patient received magnesium sulfate 4 g IV x1. ?-Outpatient follow-up with PCP. ? ?UTI (urinary tract infection) ?- Urinalysis done on admission with moderate leukocytes, nitrite negative, 11-20 WBCs. ?-Patient denies any significant urinary symptoms. ?-Urine cultures pending at the time of discharge. ?-Patient received a dose of IV Rocephin during the hospitalization with discharge home on a 2-day course of oral Vantin. ?-Outpatient follow-up with PCP. ? ?Mood disorder ([place]) ?Patient maintained on home regimen Celexa, BuSpar ? ?Chest pain ?Troponin negative x2 ?EKG does not show ischemic changes. ?-2D echo done with normal EF, NWMA.  ?-Patient remained chest pain-free during the hospitalization. ?-Patient maintained on home regimen aspirin, atorvastatin, Coreg. ?-Lisinopril held and will be resumed 3 days post discharge. ?-Patient's chest pain may have been anxiety related however improved. ?-Outpatient follow-up with PCP. ? ?Essential hypertension, benign ?-Patient maintained on home regimen Coreg.   ?-Lisinopril held and will be resumed 3 days post discharge.   ? ?Mixed hyperlipidemia ?Patient maintained on home regimen statin ? ?Type 2 diabetes mellitus with hyperglycemia ([facility]) ?- Remained stable. ?-Patient's oral hypoglycemic agents were held on admission and will be resumed on discharge. ? ? ? ? ?  ? ?Procedures: ?CT head CT C-spine 10/24/2021 ?Chest x-ray 10/24/2021 ?Carotid Dopplers 10/25/2021 ?2D echo 10/25/2021 ? ?Consultations: ?None ? ?Discharge Exam: ?Vitals:  ? 10/25/21 1200 10/25/21 1300  ?BP: 110/66 (!) 145/77  ?Pulse: 67 68  ?Resp: 20 19  ?Temp:    ?SpO2: 93% 94%  ? ? ?General: NAD.  Dry mucous membranes with ?Cardiovascular: Regular rate rhythm no murmurs rubs or gallops.  No JVD.  No lower extremity edema ?Respiratory: None ? ?Discharge  Instructions ? ? ?Discharge Instructions   ? ? Diet Carb Modified   Complete by: As directed ?  ? Increase activity slowly   Complete by: As directed ?  ? ?  ? ?Allergies as of 10/25/2021   ? ?   Reactions  ? Codeine   ? Hydrocodone   ? Lorcet [hydrocodone-acetaminophen]   ? Percocet [oxycodone-acetaminophen]   ? ?  ? ?  ?Medication List  ?  ? ?TAKE these medications   ? ?Accu-Chek Guide Me w/Device Kit ?1 Piece by Does not apply route as directed. ?  ?Accu-Chek Guide test strip ?Generic drug: glucose blood ?Use as instructed ?  ?acetaminophen 500 MG tablet ?Commonly known as: TYLENOL ?Take 2 tablets by mouth 2 (two) times daily. ?  ?albuterol 108 (90 Base) MCG/ACT inhaler ?Commonly known as: VENTOLIN HFA ?Inhale 1 puff into the lungs every 6 (six) hours as needed for shortness of breath. ?  ?aspirin 81 MG chewable tablet ?Chew 81 mg by mouth daily. ?  ?atorvastatin 80 MG tablet ?Commonly known as: LIPITOR ?Take 1 tablet by mouth daily. ?  ?busPIRone 10 MG tablet ?Commonly known as: BUSPAR ?Take 10 mg by mouth 2 (two) times daily. ?  ?carvedilol 3.125 MG tablet ?Commonly known  as: COREG ?Take 3.125 mg by mouth 2 (two) times daily. ?  ?cefdinir 300 MG capsule ?Commonly known as: OMNICEF ?Take 1 capsule (300 mg total) by mouth 2 (two) times daily for 2 days. ?  ?citalopram 20 MG tablet ?Commonly known as: CELEXA ?Take 20 mg by mouth daily. ?  ?doxycycline 50 MG capsule ?Commonly known as: VIBRAMYCIN ?Take by mouth 2 (two) times daily. Unknown strength; started on 10/22/2021 ?  ?fenofibrate 145 MG tablet ?Commonly known as: TRICOR ?Take 2 tablets by mouth daily. ?  ?gabapentin 600 MG tablet ?Commonly known as: NEURONTIN ?Take 1 tablet by mouth 2 (two) times daily. ?  ?levETIRAcetam 250 MG tablet ?Commonly known as: KEPPRA ?Take 500 mg by mouth 2 (two) times daily. ?  ?lisinopril 5 MG tablet ?Commonly known as: ZESTRIL ?Take 1 tablet (5 mg total) by mouth daily. ?Start taking on: October 29, 2021 ?What changed: These  instructions start on October 29, 2021. If you are unsure what to do until then, ask your doctor or other care provider. ?  ?metFORMIN 500 MG 24 hr tablet ?Commonly known as: GLUCOPHAGE-XR ?Take 500 mg by mouth daily. ?  ?omeprazole

## 2021-10-25 NOTE — Assessment & Plan Note (Signed)
-   Remained stable. ?-Patient's oral hypoglycemic agents were held on admission and will be resumed on discharge. ?

## 2021-10-25 NOTE — Assessment & Plan Note (Signed)
-   Patient placed on IV fluids. ?

## 2021-10-25 NOTE — Progress Notes (Signed)
*  PRELIMINARY RESULTS* ?Echocardiogram ?2D Echocardiogram has been performed. ? ?Samuel Germany ?10/25/2021, 11:11 AM ?

## 2021-10-25 NOTE — Assessment & Plan Note (Addendum)
?  Patient reports that she did not have loss of consciousness, but that her daughter said she did ?Patient admits to having significant anxiety around the event. ?-Patient noted on mission to have borderline blood pressure of 102/63. ?-Orthostatics were checked which were negative. ?-CT head CT C-spine done was unremarkable for any acute abnormalities. ?-Carotid Dopplers done with no significant ICA stenosis. ?-2D echo done with EF of 60 to 65%,NWMA, mitral valve grossly normal, right ventricular systolic function was normal, no evidence of aortic stenosis noted. ?-D-dimer was negative. ?-Patient noted to be clinically dehydrated and placed on IV fluids. ?-Patient improved clinically did not have any further episodes. ?-Patient and family advised might be prudent to hydrate for another 24 hours however patient and family insistent on being discharged stating they will keep patient well-hydrated at home if that was the main thing needed. ?-No arrhythmias noted on telemetry. ?-Patient was discharged home in stable and improved condition. ?-We will need close outpatient follow-up with PCP in the next 3 days for follow-up. ?-Patient may benefit from event monitor in the outpatient setting however will defer to PCP. ?

## 2021-10-25 NOTE — Progress Notes (Signed)
?  Transition of Care (TOC) Screening Note ? ? ?Patient Details  ?Name: Kimberly Graves ?Date of Birth: April 14, 1951 ? ? ?Transition of Care [facility]) CM/SW Contact:    ?Joaquin Courts, RN ?Phone Number: ?10/25/2021, 1:03 PM ? ? ? ?Transition of Care Department [facility]) has reviewed patient and no TOC needs have been identified at this time. We will continue to monitor patient advancement through interdisciplinary progression rounds. If new patient transition needs arise, please place a TOC consult. ?  ?

## 2021-10-25 NOTE — ED Notes (Signed)
Echo at bedside

## 2021-10-25 NOTE — Assessment & Plan Note (Addendum)
-   Urinalysis done on admission with moderate leukocytes, nitrite negative, 11-20 WBCs. ?-Patient denies any significant urinary symptoms. ?-Urine cultures pending at the time of discharge. ?-Patient received a dose of IV Rocephin during the hospitalization with discharge home on a 2-day course of oral Vantin. ?-Outpatient follow-up with PCP. ?

## 2021-10-25 NOTE — Assessment & Plan Note (Addendum)
Patient maintained on home regimen Celexa, BuSpar ?

## 2021-10-25 NOTE — Assessment & Plan Note (Addendum)
Troponin negative x2 ?EKG does not show ischemic changes. ?-2D echo done with normal EF, NWMA.  ?-Patient remained chest pain-free during the hospitalization. ?-Patient maintained on home regimen aspirin, atorvastatin, Coreg. ?-Lisinopril held and will be resumed 3 days post discharge. ?-Patient's chest pain may have been anxiety related however improved. ?-Outpatient follow-up with PCP. ?

## 2021-10-25 NOTE — H&P (Signed)
?History and Physical  ? ? ?Patient: Kimberly Graves QPY:[id number] DOB: Aug 01, 1951 ?DOA: 10/24/2021 ?DOS: the patient was seen and examined on 10/25/2021 ?PCP: Coolidge Breeze, FNP  ?Patient coming from: Home ? ?Chief Complaint:  ?Chief Complaint  ?Patient presents with  ? Fall  ? ?HPI: Kimberly Graves is a 71 y.o. female with medical history significant of history of diabetes mellitus type 2, GERD, hyperlipidemia, history of seizure,, and more presents to the ED with a chief complaint of fall.  Patient reports that she had just gotten doing dishes, putting pots and pans away.  She was kneeling on the floor when she said realize she was falling backwards.  She tried to catch herself on the cabinet door and gently laid herself down.  She thought she had already hit the floor but had not, so her head hit the dishwasher rack pretty hard on the way down.  Patient reports that she does not think she had loss of consciousness, but her daughter does think she had it.  Patient says when she has had syncope in the past she has thrown up, so since she did not throw up this time does not think she had an episode of syncope.  Patient does report that she was dizzy prior to falling.  She has vertigo, and this felt like her normal dizziness.  She little bit of a headache before she fell, but no changes in vision, no changes in hearing.  No chest pain.  Reports she feels little short of breath during the episode as well.  At home she is supposed to wear 3 L nasal cannula at night.  Patient reports no history of heart problems that she knows of.  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  She reports several tests were run and she was diagnosed as having broken heart syndrome.  Patient has no other complaints at this time.   ? ?Patient does not smoke, does not drink, is vaccinated for COVID.  Patient is full code. ? ?Review of Systems:  ?Past Medical History:  ?Diagnosis Date  ? Diabetes mellitus,  type II ([place])   ? Rectal cancer ([place])   ? ?History reviewed. No pertinent surgical history. ?Social History:  reports that she has never smoked. She does not have any smokeless tobacco history on file. She reports that she does not currently use alcohol. She reports that she does not use drugs. ? ?Allergies  ?Allergen Reactions  ? Codeine   ? Hydrocodone   ? Lorcet [Hydrocodone-Acetaminophen]   ? Percocet [Oxycodone-Acetaminophen]   ? ? ?Family History  ?Problem Relation Age of Onset  ? Hypertension Mother   ? Cancer Mother   ? Hypertension Father   ? ? ?Prior to Admission medications   ?Medication Sig Start Date End Date Taking? Authorizing Provider  ?acetaminophen (TYLENOL) 500 MG tablet Take 2 tablets by mouth 2 (two) times daily.   Yes [provider]  ?aspirin 81 MG chewable tablet Chew 81 mg by mouth daily.   Yes [provider]  ?atorvastatin (LIPITOR) 80 MG tablet Take 1 tablet by mouth daily. 11/22/20  Yes [provider]  ?busPIRone (BUSPAR) 10 MG tablet Take 10 mg by mouth 2 (two) times daily. 11/01/20  Yes [provider]  ?carvedilol (COREG) 3.125 MG tablet Take 3.125 mg by mouth 2 (two) times daily. 11/01/20  Yes [provider]  ?citalopram (CELEXA) 20 MG tablet Take 20 mg by mouth daily. 11/01/20  Yes [provider]  ?doxycycline (VIBRAMYCIN) 50 MG capsule Take by mouth 2 (two) times daily. Unknown strength; started on 10/22/2021   Yes [provider]  ?fenofibrate (TRICOR) 145 MG tablet Take 2 tablets by mouth daily.   Yes [provider]  ?gabapentin (NEURONTIN) 600 MG tablet Take 1 tablet by mouth 2 (two) times daily.   Yes [provider]  ?levETIRAcetam (KEPPRA) 250 MG tablet Take 500 mg by mouth 2 (two) times daily.   Yes [provider]  ?lisinopril (ZESTRIL) 5 MG tablet Take 5 mg by mouth daily. 11/08/20  Yes [provider]  ?metFORMIN (GLUCOPHAGE-XR) 500 MG 24 hr tablet Take 500 mg by mouth daily.  10/09/20  Yes [provider]  ?omeprazole (PRILOSEC) 20 MG capsule Take 1 capsule by mouth daily.   Yes [provider]  ?predniSONE (DELTASONE) 10 MG tablet Take by mouth daily with breakfast. Unknown strength; was Rx'ed for COPD flare-up (1 tab x 5 days) began 10/23/2021   Yes [provider]  ?TRULICITY 1.5 MO/2.9UT SOPN Inject 0.5 mLs into the skin every 7 (seven) days. 11/05/20  Yes [provider]  ?valACYclovir (VALTREX) 500 MG tablet Take 500 mg by mouth daily.   Yes [provider]  ?Blood Glucose Monitoring Suppl (ACCU-CHEK GUIDE ME) w/Device KIT 1 Piece by Does not apply route as directed. 12/19/20   Cassandria Anger, MD  ?glucose blood (ACCU-CHEK GUIDE) test strip Use as instructed 12/19/20   Cassandria Anger, MD  ?niacin 500 MG tablet Take 1 mg by mouth daily.    [provider]  ?pantoprazole (PROTONIX) 40 MG tablet Take by mouth 2 (two) times daily.    [provider]  ? ? ?Physical Exam: ?Vitals:  ? 10/25/21 0130 10/25/21 0200 10/25/21 0230 10/25/21 0330  ?BP: 140/89 (!) 149/70 (!) 141/69 128/65  ?Pulse: 63 (!) 59 64 66  ?Resp: _0 ?Temp:      ?SpO2: 98% 99% 98% 97%  ?Weight:      ?Height:      ? ?1.  General: ?Patient lying supine in bed,  no acute distress ?  ?2. Psychiatric: ?Alert, and oriented x 3, mood and behavior normal for situation, pleasant and cooperative with exam ?  ?3. Neurologic: ?Speech and language are normal, face is symmetric, moves all 4 extremities voluntarily, at baseline without acute deficits on limited exam ?  ?4. HEENMT:  ?Head is atraumatic, normocephalic, pupils reactive to light, neck is supple, trachea is midline, mucous membranes are moist ?  ?5. Respiratory : ?Lungs are clear to auscultation bilaterally without wheezing, rhonchi, rales, no cyanosis, no increase in work of breathing or accessory muscle use ?  ?6. Cardiovascular : ?Heart rate normal, rhythm is regular, no murmurs, rubs or  gallops, peripheral edema present, peripheral pulses palpated ?  ?7. Gastrointestinal:  ?Abdomen is soft, nondistended, nontender to palpation bowel sounds active, no masses or organomegaly palpated ?  ?8. Skin:  ?Skin is warm, dry and intact without rashes, acute lesions, or ulcers on limited exam ?  ?9.Musculoskeletal:  ?No acute deformities or trauma, no asymmetry in tone, peripheral edema present, peripheral pulses palpated, no tenderness to palpation in the extremities ? ?Data Reviewed: ?In the ED ?Temp 98.2, heart rate 84-89, respiratory rate 24-25, blood pressure 102/63 satting 97% ?No leukocytosis with a white blood cell count of 6.3, hemoglobin 10.7 ?Chemistry is unremarkable aside from a hyperglycemia 352, AST 59, ALT 23 ?Troponin undetectable x2 ?  CT cervical spine shows no acute fracture or traumatic subluxation ?CT head shows no evidence of acute intracranial abnormality ?Chest x-ray shows subtle heterogenous opacity of the left lung base concerning for infection or aspiration. -Checking procalcitonin ?EKG shows a heart rate of 87, sinus rhythm, QTc 454 ?1 L normal saline bolus given in the ED ? ?Assessment and Plan: ?* Syncope ? ?Patient reports that she did not have loss of consciousness, but that her daughter said she did ?Patient admits to having significant anxiety around the event ?Echo in the a.m. ?Ultrasound carotid arteries in the a.m. ?Monitor on telemetry ? ?Mood disorder ([place]) ?Celexa, BuSpar ? ?Chest pain ?Opponent negative x2 ?EKG does not show ischemic changes ?Continue aspirin, atorvastatin, Coreg, lisinopril ?Continue to monitor ? ?Essential hypertension, benign ?Continue Coreg and lisinopril ? ?Mixed hyperlipidemia ?Continue statin ? ? ? ? ? Advance Care Planning:   Code Status: Full Code  ? ?Consults:  ? ?Family Communication: No family at bedside ? ?Severity of Illness: ?The appropriate patient status for this patient is OBSERVATION. Observation status is judged to be reasonable and  necessary in order to provide the required intensity of service to ensure the patient's safety. The patient's presenting symptoms, physical exam findings, and initial radiographic and laboratory data in the

## 2021-10-25 NOTE — ED Notes (Signed)
Pt and daughter verbalized understanding of discharge paperwork and follow-up care. ?

## 2021-10-25 NOTE — Assessment & Plan Note (Signed)
-   Magnesium noted at 1.6. ?-Patient received magnesium sulfate 4 g IV x1. ?-Outpatient follow-up with PCP. ?

## 2021-10-25 NOTE — Assessment & Plan Note (Addendum)
Patient maintained on home regimen statin. ?

## 2021-10-28 LAB — URINE CULTURE: Culture: 20000 — AB

## 2021-10-29 ENCOUNTER — Telehealth: Payer: Self-pay

## 2021-10-29 NOTE — Telephone Encounter (Signed)
Post ED Visit - Positive Culture Follow-up ? ?Culture report reviewed by antimicrobial stewardship pharmacist: ?[facility] Team ?'[]'$  Elenor Quinones, Pharm.D. ?'[]'$  Heide Guile, Pharm.D., BCPS AQ-ID ?'[]'$  Parks Neptune, Pharm.D., BCPS ?'[]'$  Alycia Rossetti, Pharm.D., BCPS ?'[]'$  [place], Pharm.D., BCPS, AAHIVP ?'[]'$  Legrand Como, Pharm.D., BCPS, AAHIVP ?'[]'$  Salome Arnt, PharmD, BCPS ?'[]'$  Johnnette Gourd, PharmD, BCPS ?'[]'$  Hughes Better, PharmD, BCPS ?'[x]'$  Laurey Arrow, PharmD ?'[]'$  Laqueta Linden, PharmD, BCPS ?'[]'$  Albertina Parr, PharmD ? ?[facility] Team ?'[]'$  Leodis Sias, PharmD ?'[]'$  Lindell Spar, PharmD ?'[]'$  Royetta Asal, PharmD ?'[]'$  Graylin Shiver, Rph ?'[]'$  Rema Fendt) Kimberly Mac, PharmD ?'[]'$  Arlyn Dunning, PharmD ?'[]'$  Netta Cedars, PharmD ?'[]'$  Dia Sitter, PharmD ?'[]'$  Leone Haven, PharmD ?'[]'$  Gretta Arab, PharmD ?'[]'$  Theodis Shove, PharmD ?'[]'$  Peggyann Juba, PharmD ?'[]'$  [name], PharmD ? ? ?Positive urine culture ?Treated with Cefdinir. Cefpodoxime Proxetil, Doxycycline Hyclate, organism sensitive to the same and no further patient follow-up is required at this time. ? ?Kimberly Graves ?10/29/2021, 10:56 AM ?  ?

## 2022-01-14 ENCOUNTER — Telehealth: Payer: Self-pay | Admitting: "Endocrinology

## 2022-01-14 NOTE — Telephone Encounter (Signed)
Left message on [place] phone to call back and sch f/u

## 2022-01-14 NOTE — Telephone Encounter (Signed)
Patient daughter Kimberly Graves called and is requesting to set up an appt, she was last seen 12/19/20. Does she need to complete anything before appt or what needs to be brought with her. Thank you   [phone]

## 2022-01-28 DIAGNOSIS — E1122 Type 2 diabetes mellitus with diabetic chronic kidney disease: Secondary | ICD-10-CM | POA: Insufficient documentation

## 2022-02-16 ENCOUNTER — Ambulatory Visit (INDEPENDENT_AMBULATORY_CARE_PROVIDER_SITE_OTHER): Payer: Medicare Other | Admitting: "Endocrinology

## 2022-02-16 ENCOUNTER — Encounter: Payer: Self-pay | Admitting: "Endocrinology

## 2022-02-16 VITALS — BP 110/72 | HR 76 | Wt 168.8 lb

## 2022-02-16 DIAGNOSIS — E1165 Type 2 diabetes mellitus with hyperglycemia: Secondary | ICD-10-CM

## 2022-02-16 DIAGNOSIS — E782 Mixed hyperlipidemia: Secondary | ICD-10-CM | POA: Diagnosis not present

## 2022-02-16 DIAGNOSIS — Z6832 Body mass index (BMI) 32.0-32.9, adult: Secondary | ICD-10-CM

## 2022-02-16 DIAGNOSIS — E6609 Other obesity due to excess calories: Secondary | ICD-10-CM | POA: Diagnosis not present

## 2022-02-16 DIAGNOSIS — I10 Essential (primary) hypertension: Secondary | ICD-10-CM | POA: Diagnosis not present

## 2022-02-16 MED ORDER — TRULICITY 3 MG/0.5ML ~~LOC~~ SOAJ: 3.0000 mg | SUBCUTANEOUS | 1 refills | Status: DC

## 2022-02-16 NOTE — Patient Instructions (Signed)
                                     Advice for Weight Management  -For most of us the best way to lose weight is by diet management. Generally speaking, diet management means consuming less calories intentionally which over time brings about progressive weight loss.  This can be achieved more effectively by avoiding ultra processed carbohydrates, processed meats, unhealthy fats.    It is critically important to know your numbers: how much calorie you are consuming and how much calorie you need. More importantly, our carbohydrates sources should be unprocessed naturally occurring  complex starch food items.  It is always important to balance nutrition also by  appropriate intake of proteins (mainly plant-based), healthy fats/oils, plenty of fruits and vegetables.   -The American College of Lifestyle Medicine (ACL M) recommends nutrition derived mostly from Whole Food, Plant Predominant Sources example an apple instead of applesauce or apple pie. Eat Plenty of vegetables, Mushrooms, fruits, Legumes, Whole Grains, Nuts, seeds in lieu of processed meats, processed snacks/pastries red meat, poultry, eggs.  Use only water or unsweetened tea for hydration.  The College also recommends the need to stay away from risky substances including alcohol, smoking; obtaining 7-9 hours of restorative sleep, at least 150 minutes of moderate intensity exercise weekly, importance of healthy social connections, and being mindful of stress and seek help when it is overwhelming.    -Sticking to a routine mealtime to eat 3 meals a day and avoiding unnecessary snacks is shown to have a big role in weight control. Under normal circumstances, the only time we burn stored energy is when we are hungry, so allow  some hunger to take place- hunger means no food between appropriate meal times, only water.  It is not advisable to starve.   -It is better to avoid simple carbohydrates including:  Cakes, Sweet Desserts, Ice Cream, Soda (diet and regular), Sweet Tea, Candies, Chips, Cookies, Store Bought Juices, Alcohol in Excess of  1-2 drinks a day, Lemonade,  Artificial Sweeteners, Doughnuts, Coffee Creamers, "Sugar-free" Products, etc, etc.  This is not a complete list.....    -Consulting with certified diabetes educators is proven to provide you with the most accurate and current information on diet.  Also, you may be  interested in discussing diet options/exchanges , we can schedule a visit with Kimberly Graves, RDN, CDE for individualized nutrition education.  -Exercise: If you are able: 30 -60 minutes a day ,4 days a week, or 150 minutes of moderate intensity exercise weekly.    The longer the better if tolerated.  Combine stretch, strength, and aerobic activities.  If you were told in the past that you have high risk for cardiovascular diseases, or if you are currently symptomatic, you may seek evaluation by your heart doctor prior to initiating moderate to intense exercise programs.                                  Additional Care Considerations for Diabetes/Prediabetes   -Diabetes  is a chronic disease.  The most important care consideration is regular follow-up with your diabetes care provider with the goal being avoiding or delaying its complications and to take advantage of advances in medications and technology.  If appropriate actions are taken early enough, type 2 diabetes can even be   reversed.  Seek information from the right source.  - Whole Food, Plant Predominant Nutrition is highly recommended: Eat Plenty of vegetables, Mushrooms, fruits, Legumes, Whole Grains, Nuts, seeds in lieu of processed meats, processed snacks/pastries red meat, poultry, eggs as recommended by American College of  Lifestyle Medicine (ACLM).  -Type 2 diabetes is known to coexist with other important comorbidities such as high blood pressure and high cholesterol.  It is critical to control not only the  diabetes but also the high blood pressure and high cholesterol to minimize and delay the risk of complications including coronary artery disease, stroke, amputations, blindness, etc.  The good news is that this diet recommendation for type 2 diabetes is also very helpful for managing high cholesterol and high blood blood pressure.  - Studies showed that people with diabetes will benefit from a class of medications known as ACE inhibitors and statins.  Unless there are specific reasons not to be on these medications, the standard of care is to consider getting one from these groups of medications at an optimal doses.  These medications are generally considered safe and proven to help protect the heart and the kidneys.    - People with diabetes are encouraged to initiate and maintain regular follow-up with eye doctors, foot doctors, dentists , and if necessary heart and kidney doctors.     - It is highly recommended that people with diabetes quit smoking or stay away from smoking, and get yearly  flu vaccine and pneumonia vaccine at least every 5 years.  See above for additional recommendations on exercise, sleep, stress management , and healthy social connections.      

## 2022-02-16 NOTE — Progress Notes (Signed)
02/16/2022, 3:56 PM  Endocrinology follow-up note   Subjective:    Patient ID: Kimberly Graves, female    DOB: 04-09-1951.  Kimberly Graves is being seen in follow-up after she was seen in consultation for management of currently uncontrolled symptomatic diabetes requested by  Coolidge Breeze, FNP.   Past Medical History:  Diagnosis Date   Diabetes mellitus, type II ([place])    Rectal cancer ([place])     History reviewed. No pertinent surgical history.  Social History   Socioeconomic History   Marital status: Married    Spouse name: Not on file   Number of children: Not on file   Years of education: Not on file   Highest education level: Not on file  Occupational History   Not on file  Tobacco Use   Smoking status: Never   Smokeless tobacco: Not on file  Vaping Use   Vaping Use: Never used  Substance and Sexual Activity   Alcohol use: Not Currently   Drug use: Never   Sexual activity: Not on file  Other Topics Concern   Not on file  Social History Narrative   Not on file   Social Determinants of Health   Financial Resource Strain: Not on file  Food Insecurity: Not on file  Transportation Needs: Not on file  Physical Activity: Not on file  Stress: Not on file  Social Connections: Not on file    Family History  Problem Relation Age of Onset   Hypertension Mother    Cancer Mother    Hypertension Father     Outpatient Encounter Medications as of 02/16/2022  Medication Sig   Dulaglutide (TRULICITY) 3 TK/1.6WF SOPN Inject 3 mg as directed once a week.   acetaminophen (TYLENOL) 500 MG tablet Take 2 tablets by mouth 2 (two) times daily.   albuterol (VENTOLIN HFA) 108 (90 Base) MCG/ACT inhaler Inhale 1 puff into the lungs every 6 (six) hours as needed for shortness of breath.   aspirin 81 MG chewable tablet Chew 81 mg by mouth daily.   atorvastatin (LIPITOR) 80 MG tablet Take 1 tablet by  mouth daily.   Blood Glucose Monitoring Suppl (ACCU-CHEK GUIDE ME) w/Device KIT 1 Piece by Does not apply route as directed.   busPIRone (BUSPAR) 10 MG tablet Take 10 mg by mouth 2 (two) times daily.   carvedilol (COREG) 3.125 MG tablet Take 3.125 mg by mouth 2 (two) times daily.   citalopram (CELEXA) 20 MG tablet Take 20 mg by mouth daily.   doxycycline (VIBRAMYCIN) 50 MG capsule Take by mouth 2 (two) times daily. Unknown strength; started on 10/22/2021   fenofibrate (TRICOR) 145 MG tablet Take 2 tablets by mouth daily.   gabapentin (NEURONTIN) 600 MG tablet Take 1 tablet by mouth 2 (two) times daily. (Patient not taking: Reported on 02/16/2022)   glucose blood (ACCU-CHEK GUIDE) test strip Use as instructed   levETIRAcetam (KEPPRA) 250 MG tablet Take 500 mg by mouth 2 (two) times daily.   lisinopril (ZESTRIL) 5 MG tablet Take 1 tablet (5 mg total) by mouth daily.   metFORMIN (GLUCOPHAGE-XR) 500 MG 24 hr tablet Take 500 mg by mouth daily. (Patient  not taking: Reported on 02/16/2022)   omeprazole (PRILOSEC) 20 MG capsule Take 1 capsule by mouth daily.   predniSONE (DELTASONE) 10 MG tablet Take by mouth daily with breakfast. Unknown strength; was Rx'ed for COPD flare-up (1 tab x 5 days) began 10/23/2021   TRELEGY ELLIPTA 200-62.5-25 MCG/ACT AEPB Inhale 1 puff into the lungs daily.   valACYclovir (VALTREX) 500 MG tablet Take 500 mg by mouth daily.   [DISCONTINUED] TRULICITY 1.5 IO/0.3TD SOPN Inject 0.5 mLs into the skin every 72 (seven) days.   No facility-administered encounter medications on file as of 02/16/2022.    ALLERGIES: Allergies  Allergen Reactions   Codeine    Hydrocodone    Lorcet [Hydrocodone-Acetaminophen]    Percocet [Oxycodone-Acetaminophen]     VACCINATION STATUS:  There is no immunization history on file for this patient.  Diabetes She presents for her follow-up diabetic visit. She has type 2 diabetes mellitus. Onset time: She was diagnosed at approximate age of 71  years. Her disease course has been fluctuating. There are no hypoglycemic associated symptoms. Pertinent negatives for diabetes include no polydipsia and no polyuria. There are no hypoglycemic complications. Symptoms are worsening. There are no diabetic complications. Risk factors for coronary artery disease include diabetes mellitus, dyslipidemia, sedentary lifestyle, post-menopausal and tobacco exposure. Current diabetic treatments: She is currently on metformin 500 mg p.o. twice daily, recently added Trulicity at 1.5 mg weekly. Her weight is fluctuating minimally. She is following a generally unhealthy diet. When asked about meal planning, she reported none. She has had a previous visit with a dietitian. She rarely participates in exercise. Her home blood glucose trend is fluctuating minimally. Her breakfast blood glucose range is generally 130-140 mg/dl. Her bedtime blood glucose range is generally 130-140 mg/dl. Her overall blood glucose range is 130-140 mg/dl. (Patient has not returned for follow-up since May 2022.  Her recent A1c was reported to be 8.4%.  She presents with a piece of paper showing her most recent logs, near target.  She did not document any hypoglycemia.  She is currently only on Trulicity 1.5 mg p.o. daily.  She has stopped metformin due to GI intolerance.    ) An ACE inhibitor/angiotensin II receptor blocker is not being taken. She does not see a podiatrist.Eye exam is not current.  Hyperlipidemia This is a chronic problem. Exacerbating diseases include diabetes. Current antihyperlipidemic treatment includes fibric acid derivatives and statins. Risk factors for coronary artery disease include diabetes mellitus, dyslipidemia, hypertension, post-menopausal and a sedentary lifestyle.  Hypertension This is a chronic problem. The current episode started more than 1 year ago. Risk factors for coronary artery disease include diabetes mellitus and dyslipidemia. Past treatments include beta  blockers.     Review of Systems  Endocrine: Negative for polydipsia and polyuria.    Objective:       02/16/2022    2:45 PM 10/25/2021    1:00 PM 10/25/2021   12:00 PM  Vitals with BMI  Weight 168 lbs 13 oz    Systolic 974 163 845  Diastolic 72 77 66  Pulse 76 68 67    BP 110/72   Pulse 76   Wt 168 lb 12.8 oz (76.6 kg)   BMI 32.97 kg/m   Wt Readings from Last 3 Encounters:  02/16/22 168 lb 12.8 oz (76.6 kg)  10/24/21 166 lb (75.3 kg)  12/19/20 154 lb 6.4 oz (70 kg)     Physical Exam    CMP ( most recent) CMP  Component Value Date/Time   NA 143 10/25/2021 0635   K 3.9 10/25/2021 0635   CL 108 10/25/2021 0635   CO2 25 10/25/2021 0635   GLUCOSE 99 10/25/2021 0635   BUN 17 10/25/2021 0635   BUN 12 07/23/2020 0000   CREATININE 0.71 10/25/2021 0635   CALCIUM 9.6 10/25/2021 0635   PROT 6.5 10/25/2021 0635   ALBUMIN 3.6 10/25/2021 0635   AST 39 10/25/2021 0635   ALT 20 10/25/2021 0635   ALKPHOS 43 10/25/2021 0635   BILITOT 0.4 10/25/2021 0635   GFRNONAA >60 10/25/2021 0635     Diabetic Labs (most recent): Lab Results  Component Value Date   HGBA1C 6.7 (H) 10/24/2021   HGBA1C 12.1 (A) 11/26/2020   HGBA1C 8.2 07/23/2020   MICROALBUR 8.3 07/23/2020    Lipid Panel     Component Value Date/Time   CHOL 192 07/23/2020 0000   TRIG 526 (A) 07/23/2020 0000   HDL 35 07/23/2020 0000      Assessment & Plan:   1. Type 2 diabetes mellitus with hyperglycemia, unspecified whether long term insulin use ([facility]) - Kimberly Graves has currently uncontrolled symptomatic type 2 DM since  71 years of age.  Patient has not returned for follow-up since May 2022.  Her recent A1c was reported to be 8.4%.  She presents with a piece of paper showing her most recent logs, near target.  She did not document any hypoglycemia.  She is currently only on Trulicity 1.5 mg p.o. daily.  She has stopped metformin due to GI intolerance.    - Recent labs reviewed. - I had a long  discussion with her about the progressive nature of diabetes and the pathology behind its complications. -She does not report gross complications from her diabetes, however she remains at a high risk for more acute and chronic complications which include CAD, CVA, CKD, retinopathy, and neuropathy. These are all discussed in detail with her.  - I have counseled her on diet  and weight management  by adopting a carbohydrate restricted/protein rich diet. Patient is encouraged to switch to  unprocessed or minimally processed     complex starch and increased protein intake (animal or plant source), fruits, and vegetables. -  she is advised to stick to a routine mealtimes to eat 3 meals  a day and avoid unnecessary snacks ( to snack only to correct hypoglycemia).   - she acknowledges that there is a room for improvement in her food and drink choices. - Suggestion is made for her to avoid simple carbohydrates  from her diet including Cakes, Sweet Desserts, Ice Cream, Soda (diet and regular), Sweet Tea, Candies, Chips, Cookies, Store Bought Juices, Alcohol , Artificial Sweeteners,  Coffee Creamer, and "Sugar-free" Products, Lemonade. This will help patient to have more stable blood glucose profile and potentially avoid unintended weight gain.  The following Lifestyle Medicine recommendations according to [place]  [facility]) were discussed and and offered to patient and she  agrees to start the journey:  A. Whole Foods, Plant-Based Nutrition comprising of fruits and vegetables, plant-based proteins, whole-grain carbohydrates was discussed in detail with the patient.   A list for source of those nutrients were also provided to the patient.  Patient will use only water or unsweetened tea for hydration. B.  The need to stay away from risky substances including alcohol, smoking; obtaining 7 to 9 hours of restorative sleep, at least 150 minutes of moderate intensity exercise weekly, the  importance  of healthy social connections,  and stress management techniques were discussed. C.  A full color page of  Calorie density of various food groups per pound showing examples of each food groups was provided to the patient.    - she will be scheduled with Jearld Fenton, RDN, CDE for diabetes education.  - I have approached her with the following individualized plan to manage  her diabetes and patient agrees:   -In light of her presentation with above target glycemic profile, she will need a higher dose of Trulicity.  I discussed and increase Trulicity to 3 mg subcutaneously daily.  She has not tolerated metformin, advised to stay off of it.   -She is willing and advised to continue monitoring blood glucose twice a day-daily before breakfast and at bedtime. - she is encouraged to call clinic for blood glucose levels less than 70 or above 200 mg /dl. - Specific targets for  A1c;  LDL, HDL,  and Triglycerides were discussed with the patient.  2) Blood Pressure /Hypertension:    Her blood pressure is controlled to target.  she is advised to continue her current blood pressure medication including carvedilol 3.25 mg p.o. twice daily.     3) Lipids/Hyperlipidemia:   Review of her recent lipid panel showed un controlled hypertriglyceridemia at 526.  She is currently on fenofibrate 145 mg p.o. daily, as well as atorvastatin 80 mg p.o. nightly.   She is advised to continue on both.  Whole food plant-based diet will help with dyslipidemia as well as hypertension.   4)  Weight/Diet:  Body mass index is 32.97 kg/m.  -      she is  a candidate for some weight loss, recently experienced unintended weight loss.  I discussed with her the fact that loss of 5 - 10% of her  current body weight will have the most impact on her diabetes management.  Exercise, and detailed carbohydrates information provided  -  detailed on discharge instructions.  5) Chronic Care/Health Maintenance:  -she  is on  Statin  medications and  is encouraged to initiate and continue to follow up with Ophthalmology, Dentist,  Podiatrist at least yearly or according to recommendations, and advised to   stay away from smoking. I have recommended yearly flu vaccine and pneumonia vaccine at least every 5 years; moderate intensity exercise for up to 150 minutes weekly; and  sleep for at least 7 hours a day.  - she is  advised to maintain close follow up with Coolidge Breeze, FNP for primary care needs, as well as her other providers for optimal and coordinated care.   I spent 42 minutes in the care of the patient today including review of labs from [place], Lipids, Thyroid Function, Hematology (current and previous including abstractions from other facilities); face-to-face time discussing  her blood glucose readings/logs, discussing hypoglycemia and hyperglycemia episodes and symptoms, medications doses, her options of short and long term treatment based on the latest standards of care / guidelines;  discussion about incorporating lifestyle medicine;  and documenting the encounter. Risk reduction counseling performed per USPSTF guidelines to reduce obesity and cardiovascular risk factors.     Please refer to Patient Instructions for Blood Glucose Monitoring and Insulin/Medications Dosing Guide"  in media tab for additional information. Please  also refer to " Patient Self Inventory" in the Media  tab for reviewed elements of pertinent patient history.  Kimberly Graves participated in the discussions, expressed understanding, and voiced agreement with the above  plans.  All questions were answered to her satisfaction. she is encouraged to contact clinic should she have any questions or concerns prior to her return visit.   Follow up plan: - Return in about 5 weeks (around 03/23/2022) for Bring Meter/CGM Device/Logs- A1c in Office.  Glade Lloyd, MD [facility] [address] [place], [place] [address] Phone: [phone]  Fax: [phone]    02/16/2022, 3:56 PM  This note was partially dictated with voice recognition software. Similar sounding words can be transcribed inadequately or may not  be corrected upon review.

## 2022-02-22 ENCOUNTER — Other Ambulatory Visit: Payer: Self-pay | Admitting: "Endocrinology

## 2022-02-22 ENCOUNTER — Telehealth: Payer: Self-pay | Admitting: "Endocrinology

## 2022-02-22 MED ORDER — BD PEN NEEDLE SHORT U/F 31G X 8 MM MISC: 1.0000 | 1 refills | Status: DC

## 2022-02-22 MED ORDER — SEMAGLUTIDE (1 MG/DOSE) 4 MG/3ML ~~LOC~~ SOPN
1.0000 mg | PEN_INJECTOR | SUBCUTANEOUS | 2 refills | Status: DC
Start: 2022-02-22 — End: 2022-02-24

## 2022-02-22 NOTE — Telephone Encounter (Signed)
Patient's daughter called and stated that the Trulicity is over $546.50. She would like to know what are some other options

## 2022-02-22 NOTE — Telephone Encounter (Signed)
Spoke with pt's daughter, she is going to check on pt assistance for trulicity. She did not want to change her medication unless the pt assistance does not work out.

## 2022-02-22 NOTE — Telephone Encounter (Signed)
Joy will you call her daughter for me, please

## 2022-02-24 ENCOUNTER — Other Ambulatory Visit: Payer: Self-pay | Admitting: "Endocrinology

## 2022-02-24 MED ORDER — TRULICITY 3 MG/0.5ML ~~LOC~~ SOAJ: 3.0000 mg | SUBCUTANEOUS | 2 refills | Status: DC

## 2022-02-24 NOTE — Telephone Encounter (Signed)
Pt's daughter said she went to the pharmacy and they said they we sent in ozempic. She said that she asked to not send that in because she wanted her to stay on Trulicity. She said to please send a rx for Trulicity. Thank you

## 2022-03-23 ENCOUNTER — Ambulatory Visit: Payer: Medicare Other | Admitting: "Endocrinology

## 2022-03-31 ENCOUNTER — Ambulatory Visit (INDEPENDENT_AMBULATORY_CARE_PROVIDER_SITE_OTHER): Payer: Medicare Other | Admitting: "Endocrinology

## 2022-03-31 ENCOUNTER — Encounter: Payer: Self-pay | Admitting: "Endocrinology

## 2022-03-31 VITALS — BP 120/64 | HR 88 | Wt 164.4 lb

## 2022-03-31 DIAGNOSIS — E782 Mixed hyperlipidemia: Secondary | ICD-10-CM | POA: Diagnosis not present

## 2022-03-31 DIAGNOSIS — I10 Essential (primary) hypertension: Secondary | ICD-10-CM

## 2022-03-31 DIAGNOSIS — E1165 Type 2 diabetes mellitus with hyperglycemia: Secondary | ICD-10-CM

## 2022-03-31 LAB — POCT GLYCOSYLATED HEMOGLOBIN (HGB A1C): HbA1c, POC (controlled diabetic range): 8.1 % — AB (ref 0.0–7.0)

## 2022-03-31 MED ORDER — GLIPIZIDE ER 2.5 MG PO TB24: 2.5000 mg | ORAL_TABLET | Freq: Every day | ORAL | 1 refills | Status: DC

## 2022-03-31 NOTE — Patient Instructions (Signed)

## 2022-03-31 NOTE — Progress Notes (Signed)
03/31/2022, 5:18 PM  Endocrinology follow-up note   Subjective:    Patient ID: Kimberly Graves, female    DOB: [birth year].  Kimberly Graves is being seen in follow-up after she was seen in consultation for management of currently uncontrolled symptomatic diabetes requested by  Coolidge Breeze, FNP.   Past Medical History:  Diagnosis Date   Diabetes mellitus, type II ([place])    Rectal cancer ([place])     History reviewed. No pertinent surgical history.  Social History   Socioeconomic History   Marital status: Married    Spouse name: Not on file   Number of children: Not on file   Years of education: Not on file   Highest education level: Not on file  Occupational History   Not on file  Tobacco Use   Smoking status: Never   Smokeless tobacco: Not on file  Vaping Use   Vaping Use: Never used  Substance and Sexual Activity   Alcohol use: Not Currently   Drug use: Never   Sexual activity: Not on file  Other Topics Concern   Not on file  Social History Narrative   Not on file   Social Determinants of Health   Financial Resource Strain: Not on file  Food Insecurity: Not on file  Transportation Needs: Not on file  Physical Activity: Not on file  Stress: Not on file  Social Connections: Not on file    Family History  Problem Relation Age of Onset   Hypertension Mother    Cancer Mother    Hypertension Father     Outpatient Encounter Medications as of 03/31/2022  Medication Sig   glipiZIDE (GLUCOTROL XL) 2.5 MG 24 hr tablet Take 1 tablet (2.5 mg total) by mouth daily with breakfast.   acetaminophen (TYLENOL) 500 MG tablet Take 2 tablets by mouth 2 (two) times daily.   albuterol (VENTOLIN HFA) 108 (90 Base) MCG/ACT inhaler Inhale 1 puff into the lungs every 6 (six) hours as needed for shortness of breath.   aspirin 81 MG chewable tablet Chew 81 mg by mouth daily.   atorvastatin (LIPITOR) 80 MG  tablet Take 1 tablet by mouth daily.   Blood Glucose Monitoring Suppl (ACCU-CHEK GUIDE ME) w/Device KIT 1 Piece by Does not apply route as directed.   busPIRone (BUSPAR) 10 MG tablet Take 20 mg by mouth 2 (two) times daily.   carvedilol (COREG) 3.125 MG tablet Take 3.125 mg by mouth 2 (two) times daily.   citalopram (CELEXA) 20 MG tablet Take 20 mg by mouth daily.   doxycycline (VIBRAMYCIN) 50 MG capsule Take by mouth 2 (two) times daily. Unknown strength; started on 10/22/2021   Dulaglutide (TRULICITY) 3 OP/1.6FO SOPN Inject 3 mg as directed once a week.   fenofibrate (TRICOR) 145 MG tablet Take 2 tablets by mouth daily.   glucose blood (ACCU-CHEK GUIDE) test strip Use as instructed   Insulin Pen Needle (B-D ULTRAFINE III SHORT PEN) 31G X 8 MM MISC 1 each by Does not apply route as directed.   levETIRAcetam (KEPPRA) 250 MG tablet Take 500 mg by mouth 2 (two) times daily.   lisinopril (ZESTRIL) 5 MG tablet Take 1  tablet (5 mg total) by mouth daily.   omeprazole (PRILOSEC) 20 MG capsule Take 1 capsule by mouth daily.   predniSONE (DELTASONE) 10 MG tablet Take by mouth daily with breakfast. Unknown strength; was Rx'ed for COPD flare-up (1 tab x 5 days) began 10/23/2021   TRELEGY ELLIPTA 200-62.5-25 MCG/ACT AEPB Inhale 1 puff into the lungs daily.   valACYclovir (VALTREX) 500 MG tablet Take 500 mg by mouth daily.   [DISCONTINUED] gabapentin (NEURONTIN) 600 MG tablet Take 1 tablet by mouth 2 (two) times daily. (Patient not taking: Reported on 02/16/2022)   [DISCONTINUED] metFORMIN (GLUCOPHAGE-XR) 500 MG 24 hr tablet Take 500 mg by mouth daily. (Patient not taking: Reported on 02/16/2022)   No facility-administered encounter medications on file as of 03/31/2022.    ALLERGIES: Allergies  Allergen Reactions   Codeine    Hydrocodone    Lorcet [Hydrocodone-Acetaminophen]    Percocet [Oxycodone-Acetaminophen]     VACCINATION STATUS:  There is no immunization history on file for this  patient.  Diabetes She presents for her follow-up diabetic visit. She has type 2 diabetes mellitus. Onset time: She was diagnosed at approximate age of 74 years. Her disease course has been improving. There are no hypoglycemic associated symptoms. Pertinent negatives for diabetes include no polydipsia and no polyuria. There are no hypoglycemic complications. Symptoms are improving. There are no diabetic complications. Risk factors for coronary artery disease include diabetes mellitus, dyslipidemia, sedentary lifestyle, post-menopausal and tobacco exposure. Current diabetic treatments: She is currently on metformin 500 mg p.o. twice daily, recently added Trulicity at 1.5 mg weekly. Her weight is fluctuating minimally. She is following a generally unhealthy diet. When asked about meal planning, she reported none. She has had a previous visit with a dietitian. She rarely participates in exercise. Her home blood glucose trend is decreasing steadily. Her breakfast blood glucose range is generally 130-140 mg/dl. Her bedtime blood glucose range is generally 130-140 mg/dl. Her overall blood glucose range is 130-140 mg/dl. (Patient has returns with her daughter to clinic with improved glycemic profile and point-of-care A1c is 8.1%, improving from 8.7%.     She did not document any hypoglycemia.  She is currently only on Trulicity 3 mg p.o. daily.   ) An ACE inhibitor/angiotensin II receptor blocker is not being taken. She does not see a podiatrist.Eye exam is not current.  Hyperlipidemia This is a chronic problem. Exacerbating diseases include diabetes. Current antihyperlipidemic treatment includes fibric acid derivatives and statins. Risk factors for coronary artery disease include diabetes mellitus, dyslipidemia, hypertension, post-menopausal and a sedentary lifestyle.  Hypertension This is a chronic problem. The current episode started more than 1 year ago. Risk factors for coronary artery disease include  diabetes mellitus and dyslipidemia. Past treatments include beta blockers.     Review of Systems  Endocrine: Negative for polydipsia and polyuria.    Objective:       03/31/2022    1:10 PM 02/16/2022    2:45 PM 10/25/2021    1:00 PM  Vitals with BMI  Weight 164 lbs 6 oz 168 lbs 13 oz   Systolic 494 496 759  Diastolic 64 72 77  Pulse 88 76 68    BP 120/64   Pulse 88   Wt 164 lb 6.4 oz (74.6 kg)   BMI 32.11 kg/m   Wt Readings from Last 3 Encounters:  03/31/22 164 lb 6.4 oz (74.6 kg)  02/16/22 168 lb 12.8 oz (76.6 kg)  10/24/21 166 lb (75.3 kg)  Physical Exam    CMP ( most recent) CMP     Component Value Date/Time   NA 143 10/25/2021 0635   K 3.9 10/25/2021 0635   CL 108 10/25/2021 0635   CO2 25 10/25/2021 0635   GLUCOSE 99 10/25/2021 0635   BUN 17 10/25/2021 0635   BUN 12 07/23/2020 0000   CREATININE 0.71 10/25/2021 0635   CALCIUM 9.6 10/25/2021 0635   PROT 6.5 10/25/2021 0635   ALBUMIN 3.6 10/25/2021 0635   AST 39 10/25/2021 0635   ALT 20 10/25/2021 0635   ALKPHOS 43 10/25/2021 0635   BILITOT 0.4 10/25/2021 0635   GFRNONAA >60 10/25/2021 0635     Diabetic Labs (most recent): Lab Results  Component Value Date   HGBA1C 8.1 (A) 03/31/2022   HGBA1C 6.7 (H) 10/24/2021   HGBA1C 12.1 (A) 11/26/2020   MICROALBUR 8.3 07/23/2020    Lipid Panel     Component Value Date/Time   CHOL 192 07/23/2020 0000   TRIG 526 (A) 07/23/2020 0000   HDL 35 07/23/2020 0000      Assessment & Plan:   1. Type 2 diabetes mellitus with hyperglycemia, unspecified whether long term insulin use (HCC) - Kimberly Graves has currently uncontrolled symptomatic type 2 DM since  71 years of age.  Patient has returns with her daughter to clinic with improved glycemic profile and point-of-care A1c is 8.1%, improving from 8.7%.     She did not document any hypoglycemia.  She is currently only on Trulicity 3 mg p.o. daily.   - Recent labs reviewed. - I had a long discussion  with her about the progressive nature of diabetes and the pathology behind its complications. -She does not report gross complications from her diabetes, however she remains at a high risk for more acute and chronic complications which include CAD, CVA, CKD, retinopathy, and neuropathy. These are all discussed in detail with her.  - I have counseled her on diet  and weight management  by adopting a carbohydrate restricted/protein rich diet. Patient is encouraged to switch to  unprocessed or minimally processed     complex starch and increased protein intake (animal or plant source), fruits, and vegetables. -  she is advised to stick to a routine mealtimes to eat 3 meals  a day and avoid unnecessary snacks ( to snack only to correct hypoglycemia).   - she acknowledges that there is a room for improvement in her food and drink choices. - Suggestion is made for her to avoid simple carbohydrates  from her diet including Cakes, Sweet Desserts, Ice Cream, Soda (diet and regular), Sweet Tea, Candies, Chips, Cookies, Store Bought Juices, Alcohol , Artificial Sweeteners,  Coffee Creamer, and "Sugar-free" Products, Lemonade. This will help patient to have more stable blood glucose profile and potentially avoid unintended weight gain.  The following Lifestyle Medicine recommendations according to [place]  [facility]) were discussed and and offered to patient and she  agrees to start the journey:  A. Whole Foods, Plant-Based Nutrition comprising of fruits and vegetables, plant-based proteins, whole-grain carbohydrates was discussed in detail with the patient.   A list for source of those nutrients were also provided to the patient.  Patient will use only water or unsweetened tea for hydration. B.  The need to stay away from risky substances including alcohol, smoking; obtaining 7 to 9 hours of restorative sleep, at least 150 minutes of moderate intensity exercise weekly, the importance of  healthy social connections,  and stress management techniques were discussed. C.  A full color page of  Calorie density of various food groups per pound showing examples of each food groups was provided to the patient.    - she will be scheduled with Jearld Fenton, RDN, CDE for diabetes education.  - I have approached her with the following individualized plan to manage  her diabetes and patient agrees:   -In light of her presentation with above target glycemic profile, she will need additional intervention besides Trulicity.   I advised her to continue Trulicity 3 mg subcutaneously   -She is willing and advised to continue monitoring blood glucose twice a day-daily before breakfast and at bedtime. - she is encouraged to call clinic for blood glucose levels less than 70 or above 200 mg /dl.  -I discussed and added glipizide 2.5 mg XL p.o. daily at breakfast. - Specific targets for  A1c;  LDL, HDL,  and Triglycerides were discussed with the patient.  2) Blood Pressure /Hypertension:    Her blood pressure is controlled to target.  she is advised to continue her current blood pressure medication including carvedilol 3.25 mg p.o. twice daily.     3) Lipids/Hyperlipidemia:   Review of her recent lipid panel showed un controlled hypertriglyceridemia at 526.  She is currently on fenofibrate 145 mg p.o. daily, as well as atorvastatin 80 mg p.o. nightly.  She is advised to continue on both.    Whole food plant-based diet will help with dyslipidemia as well as hypertension.   4)  Weight/Diet:  Body mass index is 32.11 kg/m.  -      she is  a candidate for some weight loss, recently experienced unintended weight loss.  I discussed with her the fact that loss of 5 - 10% of her  current body weight will have the most impact on her diabetes management.  Exercise, and detailed carbohydrates information provided  -  detailed on discharge instructions.  5) Chronic Care/Health Maintenance:  -she  is on   Statin medications and  is encouraged to initiate and continue to follow up with Ophthalmology, Dentist,  Podiatrist at least yearly or according to recommendations, and advised to   stay away from smoking. I have recommended yearly flu vaccine and pneumonia vaccine at least every 5 years; moderate intensity exercise for up to 150 minutes weekly; and  sleep for at least 7 hours a day.  - she is  advised to maintain close follow up with Coolidge Breeze, FNP for primary care needs, as well as her other providers for optimal and coordinated care.  I spent 35 minutes in the care of the patient today including review of labs from [place], Lipids, Thyroid Function, Hematology (current and previous including abstractions from other facilities); face-to-face time discussing  her blood glucose readings/logs, discussing hypoglycemia and hyperglycemia episodes and symptoms, medications doses, her options of short and long term treatment based on the latest standards of care / guidelines;  discussion about incorporating lifestyle medicine;  and documenting the encounter. Risk reduction counseling performed per USPSTF guidelines to reduce obesity and cardiovascular risk factors.     Please refer to Patient Instructions for Blood Glucose Monitoring and Insulin/Medications Dosing Guide"  in media tab for additional information. Please  also refer to " Patient Self Inventory" in the Media  tab for reviewed elements of pertinent patient history.  Kimberly Graves participated in the discussions, expressed understanding, and voiced agreement with the above plans.  All questions were  answered to her satisfaction. she is encouraged to contact clinic should she have any questions or concerns prior to her return visit.   Follow up plan: - Return in about 4 months (around 07/31/2022) for Bring Meter/CGM Device/Logs- A1c in Office.  Glade Lloyd, MD [facility] [address] [place], [place] [address] Phone: [phone]  Fax: [phone]    03/31/2022, 5:18 PM  This note was partially dictated with voice recognition software. Similar sounding words can be transcribed inadequately or may not  be corrected upon review.

## 2022-04-20 ENCOUNTER — Ambulatory Visit: Payer: Medicare Other | Admitting: Orthopaedic Surgery

## 2022-04-21 ENCOUNTER — Other Ambulatory Visit (HOSPITAL_COMMUNITY): Payer: Self-pay | Admitting: Family Medicine

## 2022-04-21 DIAGNOSIS — Z1231 Encounter for screening mammogram for malignant neoplasm of breast: Secondary | ICD-10-CM

## 2022-04-27 ENCOUNTER — Ambulatory Visit (INDEPENDENT_AMBULATORY_CARE_PROVIDER_SITE_OTHER): Payer: Medicare Other | Admitting: Orthopaedic Surgery

## 2022-04-27 ENCOUNTER — Encounter: Payer: Self-pay | Admitting: Orthopaedic Surgery

## 2022-04-27 VITALS — Ht 60.0 in | Wt 167.0 lb

## 2022-04-27 DIAGNOSIS — M25561 Pain in right knee: Secondary | ICD-10-CM

## 2022-04-27 DIAGNOSIS — G8929 Other chronic pain: Secondary | ICD-10-CM

## 2022-04-27 LAB — LIPID PANEL
Cholesterol: 185 (ref 0–200)
HDL: 31 — AB (ref 35–70)
Triglycerides: 627 — AB (ref 40–160)

## 2022-04-27 MED ORDER — METHYLPREDNISOLONE ACETATE 40 MG/ML IJ SUSP
40.0000 mg | Freq: Once | INTRAMUSCULAR | Status: AC
  Administered 2022-04-27: 40 mg via INTRA_ARTICULAR

## 2022-04-27 NOTE — Progress Notes (Signed)
Subjective:    Patient ID: Kimberly Graves, female    DOB: 1951/05/08, 71 y.o.   MRN: [id number]  HPI She fell while walking her dog on December 06, 2021 and hurt her right knee.  It has been hurting since then in the right knee.  It swells, it pops and it gives way.  It is not getting better.  She has seen her local doctor at [facility]. She has been on ibuprofen, Tylenol, Aspercreme with little help. She has had X-rays.    I have reviewed the notes from [place].  I have independently reviewed and interpreted x-rays of this patient done at another site by another physician or qualified health professional.  She is tired of hurting.  She uses a cane now.   Review of Systems  Constitutional:  Positive for activity change.  Musculoskeletal:  Positive for arthralgias, gait problem and joint swelling.  All other systems reviewed and are negative. For Review of Systems, all other systems reviewed and are negative.  The following is a summary of the past history medically, past history surgically, known current medicines, social history and family history.  This information is gathered electronically by the computer from prior information and documentation.  I review this each visit and have found including this information at this point in the chart is beneficial and informative.   Past Medical History:  Diagnosis Date   Diabetes mellitus, type II ([employer])    Rectal cancer ([place])     History reviewed. No pertinent surgical history.  Current Outpatient Medications on File Prior to Visit  Medication Sig Dispense Refill   acetaminophen (TYLENOL) 500 MG tablet Take 2 tablets by mouth 2 (two) times daily.     albuterol (VENTOLIN HFA) 108 (90 Base) MCG/ACT inhaler Inhale 1 puff into the lungs every 6 (six) hours as needed for shortness of breath.     aspirin 81 MG chewable tablet Chew 81 mg by mouth daily.     atorvastatin (LIPITOR) 80 MG tablet Take 1 tablet by mouth daily.      Blood Glucose Monitoring Suppl (ACCU-CHEK GUIDE ME) w/Device KIT 1 Piece by Does not apply route as directed. 1 kit 0   busPIRone (BUSPAR) 10 MG tablet Take 20 mg by mouth 2 (two) times daily.     carvedilol (COREG) 3.125 MG tablet Take 3.125 mg by mouth 2 (two) times daily.     citalopram (CELEXA) 20 MG tablet Take 20 mg by mouth daily.     Dulaglutide (TRULICITY) 3 VQ/9.4HW SOPN Inject 3 mg as directed once a week. 2 mL 2   fenofibrate (TRICOR) 145 MG tablet Take 2 tablets by mouth daily.     glipiZIDE (GLUCOTROL XL) 2.5 MG 24 hr tablet Take 1 tablet (2.5 mg total) by mouth daily with breakfast. 90 tablet 1   glucose blood (ACCU-CHEK GUIDE) test strip Use as instructed 100 each 2   Insulin Pen Needle (B-D ULTRAFINE III SHORT PEN) 31G X 8 MM MISC 1 each by Does not apply route as directed. 50 each 1   levETIRAcetam (KEPPRA) 250 MG tablet Take 500 mg by mouth 2 (two) times daily.     lisinopril (ZESTRIL) 5 MG tablet Take 1 tablet (5 mg total) by mouth daily.     omeprazole (PRILOSEC) 20 MG capsule Take 1 capsule by mouth daily.     TRELEGY ELLIPTA 200-62.5-25 MCG/ACT AEPB Inhale 1 puff into the lungs daily.     valACYclovir (VALTREX) 500  MG tablet Take 500 mg by mouth daily.     No current facility-administered medications on file prior to visit.    Social History   Socioeconomic History   Marital status: Married    Spouse name: Not on file   Number of children: Not on file   Years of education: Not on file   Highest education level: Not on file  Occupational History   Not on file  Tobacco Use   Smoking status: Never   Smokeless tobacco: Not on file  Vaping Use   Vaping Use: Never used  Substance and Sexual Activity   Alcohol use: Not Currently   Drug use: Never   Sexual activity: Not on file  Other Topics Concern   Not on file  Social History Narrative   Not on file   Social Determinants of Health   Financial Resource Strain: Not on file  Food Insecurity: Not on file   Transportation Needs: Not on file  Physical Activity: Not on file  Stress: Not on file  Social Connections: Not on file  Intimate Partner Violence: Not on file    Family History  Problem Relation Age of Onset   Hypertension Mother    Cancer Mother    Hypertension Father     Ht 5' (1.524 m)   Wt 167 lb (75.8 kg)   BMI 32.61 kg/m   Body mass index is 32.61 kg/m.      Objective:   Physical Exam Vitals and nursing note reviewed. Exam conducted with a chaperone present.  Constitutional:      Appearance: She is well-developed.  HENT:     Head: Normocephalic and atraumatic.  Eyes:     Conjunctiva/sclera: Conjunctivae normal.     Pupils: Pupils are equal, round, and reactive to light.  Cardiovascular:     Rate and Rhythm: Normal rate and regular rhythm.  Pulmonary:     Effort: Pulmonary effort is normal.  Abdominal:     Palpations: Abdomen is soft.  Musculoskeletal:     Cervical back: Normal range of motion and neck supple.       Legs:  Skin:    General: Skin is warm and dry.  Neurological:     Mental Status: She is alert and oriented to person, place, and time.     Cranial Nerves: No cranial nerve deficit.     Motor: No abnormal muscle tone.     Coordination: Coordination normal.     Deep Tendon Reflexes: Reflexes are normal and symmetric. Reflexes normal.  Psychiatric:        Behavior: Behavior normal.        Thought Content: Thought content normal.        Judgment: Judgment normal.   X-rays showed medial narrowing of the right knee and mild degenerative changes.        Assessment & Plan:   Encounter Diagnosis  Name Primary?   Chronic pain of right knee Yes   PROCEDURE NOTE:  The patient requests injections of the right knee , verbal consent was obtained.  The right knee was prepped appropriately after time out was performed.   Sterile technique was observed and injection of 1 cc of DepoMedrol $RemoveBefor'40mg'[id number]$  with several cc's of plain xylocaine. Anesthesia  was provided by ethyl chloride and a 20-gauge needle was used to inject the knee area. The injection was tolerated well.  A band aid dressing was applied.  The patient was advised to apply ice later today and tomorrow to  the injection sight as needed.  Increase the ibuprofen to two three times a day.  She may need MRI.  Return in two weeks.  Call if any problem.  Precautions discussed.  Electronically Signed Sanjuana Kava, MD 9/19/20231:47 PM

## 2022-05-03 ENCOUNTER — Ambulatory Visit (HOSPITAL_COMMUNITY): Payer: Medicare Other

## 2022-05-05 ENCOUNTER — Other Ambulatory Visit: Payer: Self-pay | Admitting: "Endocrinology

## 2022-05-05 ENCOUNTER — Telehealth: Payer: Self-pay | Admitting: "Endocrinology

## 2022-05-05 MED ORDER — ICOSAPENT ETHYL 1 G PO CAPS: 1.0000 g | ORAL_CAPSULE | Freq: Two times a day (BID) | ORAL | 1 refills | Status: DC

## 2022-05-05 NOTE — Telephone Encounter (Signed)
Discussed with pt's daughter, understanding voiced. °

## 2022-05-05 NOTE — Telephone Encounter (Signed)
Patient's daughter Kimberly Graves left a VM stating that her labs were sent over (they are abstracted) she wants to know what else can she do about her Triglycerides? She said that she is trying to help her with exercise and diet. Please advise [phone]

## 2022-05-11 ENCOUNTER — Ambulatory Visit (INDEPENDENT_AMBULATORY_CARE_PROVIDER_SITE_OTHER): Payer: Medicare Other | Admitting: Orthopaedic Surgery

## 2022-05-11 ENCOUNTER — Encounter: Payer: Self-pay | Admitting: Orthopaedic Surgery

## 2022-05-11 VITALS — BP 133/74 | HR 90 | Ht 60.0 in | Wt 167.0 lb

## 2022-05-11 DIAGNOSIS — G8929 Other chronic pain: Secondary | ICD-10-CM

## 2022-05-11 DIAGNOSIS — M25561 Pain in right knee: Secondary | ICD-10-CM

## 2022-05-11 NOTE — Progress Notes (Signed)
I have good and bad days.  Her right knee is still hurting.  She has good and bad days, about equal.  She needs a cane.  She has no new trauma, no giving way.    NV Intact right leg, ROM right knee 0 -105, limp right, pain medially, crepitus and effusion present, positive medial McMurray.  No distal edema.  Uses a cane.  Encounter Diagnosis  Name Primary?   Chronic pain of right knee Yes   I will get MRI of the right knee.  She is going to [place] this weekend and returning on October 21.  I will have her get MRI after return and see [place] here in one month.  Call if any problem.  Precautions discussed.  Electronically Signed Sanjuana Kava, MD 10/3/20232:47 PM

## 2022-05-11 NOTE — Patient Instructions (Signed)
While we are working on your approval please go ahead and call to schedule your appointment with [facility] in at least 2 weeks.    Central Scheduling [phone]  AFTER you have made your imaging appointment, please call our office back at [phone] to schedule an appointment to review your results. Your follow up appointment will need to be 3-4 days after your imaging is performed to allow [place] time to get the report. If you are having your imaging performed in a facility not associated with [facility], you will need to ask for a CD with your images on them.

## 2022-06-03 ENCOUNTER — Ambulatory Visit (HOSPITAL_COMMUNITY)
Admission: RE | Admit: 2022-06-03 | Discharge: 2022-06-03 | Disposition: A | Discharge status: Home / Self Care | Payer: Medicare Other | Source: Ambulatory Visit | Attending: Orthopaedic Surgery | Admitting: Orthopaedic Surgery

## 2022-06-03 DIAGNOSIS — G8929 Other chronic pain: Secondary | ICD-10-CM | POA: Diagnosis present

## 2022-06-03 DIAGNOSIS — M25561 Pain in right knee: Secondary | ICD-10-CM | POA: Insufficient documentation

## 2022-06-08 ENCOUNTER — Other Ambulatory Visit: Payer: Self-pay | Admitting: "Endocrinology

## 2022-06-08 ENCOUNTER — Telehealth: Payer: Self-pay | Admitting: "Endocrinology

## 2022-06-08 ENCOUNTER — Ambulatory Visit: Payer: Medicare Other | Admitting: Orthopedic Surgery

## 2022-06-08 MED ORDER — GLIPIZIDE ER 2.5 MG PO TB24: 2.5000 mg | ORAL_TABLET | Freq: Every day | ORAL | 0 refills | Status: DC

## 2022-06-08 NOTE — Telephone Encounter (Signed)
Rx sent 

## 2022-06-08 NOTE — Telephone Encounter (Signed)
Can you re send her Glipizide. Patient's daughter said the pharmacy never got it in august

## 2022-06-10 ENCOUNTER — Ambulatory Visit (INDEPENDENT_AMBULATORY_CARE_PROVIDER_SITE_OTHER): Payer: Medicare Other | Admitting: Orthopaedic Surgery

## 2022-06-10 ENCOUNTER — Encounter: Payer: Self-pay | Admitting: Orthopaedic Surgery

## 2022-06-10 DIAGNOSIS — S83241A Other tear of medial meniscus, current injury, right knee, initial encounter: Secondary | ICD-10-CM

## 2022-06-10 DIAGNOSIS — G8929 Other chronic pain: Secondary | ICD-10-CM

## 2022-06-10 NOTE — Progress Notes (Signed)
My knee still hurts.  She is accompanied by her son today.  She had the MRI of the right knee and it showed; IMPRESSION: 1. Complex tear of the posterior horn of the medial meniscus extending into the body. Peripheral meniscal extrusion. 2. Partial-thickness cartilage loss of the medial patellar facet. 3. Partial-thickness cartilage loss with areas of high-grade partial-thickness cartilage loss of the medial femorotibial compartment.  I have explained the findings to them.  I have shown them a model of the knee.  I have recommended she consider arthroscopy of the knee.    She had a prior bad anesthesia event and is reluctant to have any surgery.  I will have her see Dr. Aline Brochure for further evaluation.    She is agreeable to this.  Right knee has effusion, crepitus, ROM 0 to 105, positive medial McMurray, limp right, uses a cane, NV intact, no distal edema.  Encounter Diagnoses  Name Primary?   Tear of medial meniscus of right knee, current, initial encounter Yes   Chronic pain of right knee    To see Dr. Aline Brochure.  Be careful going down the stairs.  Call if any problem.  Precautions discussed.  Electronically Signed Sanjuana Kava, MD 11/2/202311:03 AM

## 2022-06-16 ENCOUNTER — Ambulatory Visit: Payer: Medicare Other | Admitting: Orthopedic Surgery

## 2022-06-23 ENCOUNTER — Ambulatory Visit (INDEPENDENT_AMBULATORY_CARE_PROVIDER_SITE_OTHER): Payer: Medicare Other | Admitting: Orthopedic Surgery

## 2022-06-23 VITALS — BP 105/63 | HR 81 | Ht 60.0 in | Wt 167.0 lb

## 2022-06-23 DIAGNOSIS — M1711 Unilateral primary osteoarthritis, right knee: Secondary | ICD-10-CM | POA: Diagnosis not present

## 2022-06-23 DIAGNOSIS — J449 Chronic obstructive pulmonary disease, unspecified: Secondary | ICD-10-CM | POA: Diagnosis not present

## 2022-06-23 DIAGNOSIS — M23321 Other meniscus derangements, posterior horn of medial meniscus, right knee: Secondary | ICD-10-CM

## 2022-06-23 DIAGNOSIS — R29898 Other symptoms and signs involving the musculoskeletal system: Secondary | ICD-10-CM

## 2022-06-23 NOTE — Progress Notes (Signed)
Chief Complaint  Patient presents with   Knee Pain    Right- used some aspercreme last night helped with pain but it wakes her at night has trouble going down stairs feels like it is going to give away   Kimberly Graves presents 71 year old female evaluation of right knee for possible surgery.  Her daughter is present and her son is on the phone via speaker phone  The patient is 71 years old she was in her normal state of health she was walking the dogs fell onto her right knee and since that time is a progressively worsening right knee pain which did not respond to cortisone injection  The family also notes that the patient had global weakness and has had physical therapy in the home trying to work on her balance and walking  She had multiple surgeries due to rectal cancer  She has a significant smoking history and had lung cancer and now has COPD she has some oxygen at home usually run sats between 93 and 75  She lives with her daughter and son-in-law.  Her activity level is very low she does not do any shopping or cleaning does not go shopping with her daughter-in-law.  She seems to be very sedentary  Review of systems global weakness, no chest pain may have some mild exertional shortness of breath  Frequent falling and giving way of the legs  Past Medical History:  Diagnosis Date   Diabetes mellitus, type II ([place])    Rectal cancer ([place])    No past surgical history on file. Family History  Problem Relation Age of Onset   Hypertension Mother    Cancer Mother    Hypertension Father     Social History   Tobacco Use   Smoking status: Never  Vaping Use   Vaping Use: Never used  Substance Use Topics   Alcohol use: Not Currently   Drug use: Never    Current Outpatient Medications  Medication Instructions   acetaminophen (TYLENOL) 500 MG tablet 2 tablets, Oral, 2 times daily   albuterol (VENTOLIN HFA) 108 (90 Base) MCG/ACT inhaler 1 puff, Inhalation, Every 6 hours PRN    aspirin 81 mg, Oral, Daily   atorvastatin (LIPITOR) 80 MG tablet 1 tablet, Oral, Daily   Blood Glucose Monitoring Suppl (ACCU-CHEK GUIDE ME) w/Device KIT 1 Piece, Does not apply, As directed   busPIRone (BUSPAR) 20 mg, Oral, 2 times daily   carvedilol (COREG) 3.125 mg, Oral, 2 times daily   citalopram (CELEXA) 20 mg, Oral, Daily   fenofibrate (TRICOR) 145 MG tablet 2 tablets, Oral, Daily   glipiZIDE (GLUCOTROL XL) 2.5 mg, Oral, Daily with breakfast   glucose blood (ACCU-CHEK GUIDE) test strip Use as instructed   icosapent Ethyl (VASCEPA) 1 g, Oral, 2 times daily with meals   Insulin Pen Needle (B-D ULTRAFINE III SHORT PEN) 31G X 8 MM MISC 1 each, Does not apply, As directed   levETIRAcetam (KEPPRA) 500 mg, Oral, 2 times daily   lisinopril (ZESTRIL) 5 mg, Oral, Daily   omeprazole (PRILOSEC) 20 MG capsule 1 capsule, Oral, Daily   TRELEGY ELLIPTA 200-62.5-25 MCG/ACT AEPB 1 puff, Inhalation, Daily   Trulicity 3 mg, Injection, Weekly   valACYclovir (VALTREX) 500 mg, Oral, Daily    BP 105/63   Pulse 81   Ht 5' (1.524 m)   Wt 167 lb (75.8 kg)   BMI 32.61 kg/m   General appearance overall the patient has a small frame weight right slight disproportionate  She is oriented x3  Her mood is pleasant her affect is normal  She ambulates with a cane  Her right knee has no effusion she is tender over the medial joint line she has excellent range of motion without deficit her knees are stable muscle strength and tone feel normal skin is intact McMurray's sign was positive for medial meniscal tear.  She had no peripheral edema.  Sensation in the right leg was normal she had no pathologic reflexes   Right knee plain images are available for review she does have an MRI dated June 06, 2022 showed complex tear of the posterior horn the medial meniscus with peripheral meniscal extrusion Chane partial-thickness cartilage loss medial patella facet and partial-thickness cartilage loss with areas of  high-grade partial-thickness cartilage loss in the medial femoral-tibial compartment  I spent extensive time talking to the daughter the patient and the mother who was on speaker  The patient has several issues here   She is sedentary  2 she has COPD  3 she has proximal weakness with frequent falls and loss of balance  4 she has a torn meniscus  5 she has osteoarthritis of the knee  I do not think she is a candidate for total knee and she is a candidate for knee arthroscopy with good support at home and an understanding that her meniscal signs will be alleviated by her arthritic signs and symptoms will persist in the weakness must be addressed with physical therapy  The family seems to understand this but the patient said she did not want to have surgery so they are going to talk about it  Because the knee is giving way and she has instability episodes we are going to put her in a hinged knee brace.  Once we get further correspondence with the family and we will make further decisions regarding treatment  Encounter Diagnoses  Name Primary?   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

## 2022-07-05 ENCOUNTER — Ambulatory Visit (HOSPITAL_COMMUNITY)
Admission: RE | Admit: 2022-07-05 | Discharge: 2022-07-05 | Disposition: A | Discharge status: Home / Self Care | Payer: Medicare Other | Source: Ambulatory Visit | Attending: Family Medicine | Admitting: Family Medicine

## 2022-07-05 ENCOUNTER — Encounter (HOSPITAL_COMMUNITY): Payer: Self-pay | Admitting: Radiology

## 2022-07-05 DIAGNOSIS — Z1231 Encounter for screening mammogram for malignant neoplasm of breast: Secondary | ICD-10-CM | POA: Diagnosis present

## 2022-07-08 ENCOUNTER — Other Ambulatory Visit (HOSPITAL_COMMUNITY): Payer: Self-pay | Admitting: Family Medicine

## 2022-07-08 DIAGNOSIS — R928 Other abnormal and inconclusive findings on diagnostic imaging of breast: Secondary | ICD-10-CM

## 2022-07-12 ENCOUNTER — Telehealth: Payer: Self-pay | Admitting: Orthopedic Surgery

## 2022-07-12 DIAGNOSIS — R29898 Other symptoms and signs involving the musculoskeletal system: Secondary | ICD-10-CM

## 2022-07-12 NOTE — Telephone Encounter (Signed)
Kimberly Graves [phone]), pt's daughter called, stated that she has decided to not move forward w/surgery.  Stated that Dr. Aline Brochure was going to order some PT for her for strengthening muscles and help w/balancing.   She would like to speak to someone.

## 2022-07-13 NOTE — Telephone Encounter (Signed)
Noted, thanks. I have called Elmyra Ricks to advise can send physical therapy orders to [facility] and they will call with appointments   Left message to advise.

## 2022-07-22 ENCOUNTER — Ambulatory Visit (HOSPITAL_COMMUNITY)
Admission: RE | Admit: 2022-07-22 | Discharge: 2022-07-22 | Disposition: A | Discharge status: Home / Self Care | Payer: Medicare Other | Source: Ambulatory Visit | Attending: Family Medicine | Admitting: Family Medicine

## 2022-07-22 DIAGNOSIS — R928 Other abnormal and inconclusive findings on diagnostic imaging of breast: Secondary | ICD-10-CM | POA: Insufficient documentation

## 2022-07-23 ENCOUNTER — Encounter (HOSPITAL_COMMUNITY): Payer: Self-pay | Admitting: Family Medicine

## 2022-07-23 ENCOUNTER — Other Ambulatory Visit: Payer: Self-pay | Admitting: Family Medicine

## 2022-07-23 DIAGNOSIS — R921 Mammographic calcification found on diagnostic imaging of breast: Secondary | ICD-10-CM

## 2022-07-23 DIAGNOSIS — R928 Other abnormal and inconclusive findings on diagnostic imaging of breast: Secondary | ICD-10-CM

## 2022-07-28 ENCOUNTER — Other Ambulatory Visit: Payer: Self-pay | Admitting: "Endocrinology

## 2022-08-04 ENCOUNTER — Ambulatory Visit
Admission: RE | Admit: 2022-08-04 | Discharge: 2022-08-04 | Disposition: A | Discharge status: Home / Self Care | Payer: Medicare Other | Source: Ambulatory Visit | Attending: Family Medicine | Admitting: Family Medicine

## 2022-08-04 DIAGNOSIS — R921 Mammographic calcification found on diagnostic imaging of breast: Secondary | ICD-10-CM

## 2022-08-04 DIAGNOSIS — R928 Other abnormal and inconclusive findings on diagnostic imaging of breast: Secondary | ICD-10-CM

## 2022-08-04 HISTORY — PX: BREAST BIOPSY: SHX20

## 2022-08-12 ENCOUNTER — Ambulatory Visit (INDEPENDENT_AMBULATORY_CARE_PROVIDER_SITE_OTHER): Payer: Medicare Other | Admitting: "Endocrinology

## 2022-08-12 ENCOUNTER — Encounter: Payer: Self-pay | Admitting: "Endocrinology

## 2022-08-12 VITALS — BP 124/68 | HR 64 | Ht 60.0 in | Wt 172.0 lb

## 2022-08-12 DIAGNOSIS — E1165 Type 2 diabetes mellitus with hyperglycemia: Secondary | ICD-10-CM | POA: Diagnosis not present

## 2022-08-12 DIAGNOSIS — I10 Essential (primary) hypertension: Secondary | ICD-10-CM

## 2022-08-12 DIAGNOSIS — E782 Mixed hyperlipidemia: Secondary | ICD-10-CM | POA: Diagnosis not present

## 2022-08-12 LAB — POCT GLYCOSYLATED HEMOGLOBIN (HGB A1C): HbA1c, POC (controlled diabetic range): 7.7 % — AB (ref 0.0–7.0)

## 2022-08-12 MED ORDER — GLIPIZIDE ER 5 MG PO TB24: 5.0000 mg | ORAL_TABLET | Freq: Every day | ORAL | 1 refills | Status: DC

## 2022-08-12 NOTE — Progress Notes (Signed)
08/12/2022, 5:37 PM  Endocrinology follow-up note   Subjective:    Patient ID: Kimberly Graves, female    DOB: 19-May-1951.  Kimberly Graves is being seen in follow-up after she was seen in consultation for management of currently uncontrolled symptomatic diabetes requested by  Coolidge Breeze, FNP.   Past Medical History:  Diagnosis Date   Diabetes mellitus, type II ([place])    Rectal cancer [facility])     Past Surgical History:  Procedure Laterality Date   BREAST BIOPSY Right 08/04/2022   MM RT BREAST BX W LOC DEV 1ST LESION IMAGE BX SPEC STEREO GUIDE 08/04/2022 [facility] MAMMOGR[facility]    Social History   Socioeconomic History   Marital status: Widowed    Spouse name: Not on file   Number of children: Not on file   Years of education: Not on file   Highest education level: Not on file  Occupational History   Not on file  Tobacco Use   Smoking status: Never   Smokeless tobacco: Not on file  Vaping Use   Vaping Use: Never used  Substance and Sexual Activity   Alcohol use: Not Currently   Drug use: Never   Sexual activity: Not on file  Other Topics Concern   Not on file  Social History Narrative   Not on file   Social Determinants of Health   Financial Resource Strain: Not on file  Food Insecurity: Not on file  Transportation Needs: Not on file  Physical Activity: Not on file  Stress: Not on file  Social Connections: Not on file    Family History  Problem Relation Age of Onset   Hypertension Mother    Cancer Mother    Hypertension Father     Outpatient Encounter Medications as of 08/12/2022  Medication Sig   acetaminophen (TYLENOL) 500 MG tablet Take 2 tablets by mouth 2 (two) times daily.   albuterol (VENTOLIN HFA) 108 (90 Base) MCG/ACT inhaler Inhale 1 puff into the lungs every 6 (six) hours as needed for shortness of breath.   aspirin 81 MG chewable tablet Chew 81 mg by mouth daily.    atorvastatin (LIPITOR) 80 MG tablet Take 1 tablet by mouth daily.   Blood Glucose Monitoring Suppl (ACCU-CHEK GUIDE ME) w/Device KIT 1 Piece by Does not apply route as directed.   busPIRone (BUSPAR) 10 MG tablet Take 20 mg by mouth 2 (two) times daily.   carvedilol (COREG) 3.125 MG tablet Take 3.125 mg by mouth 2 (two) times daily.   citalopram (CELEXA) 20 MG tablet Take 20 mg by mouth daily.   fenofibrate (TRICOR) 145 MG tablet Take 2 tablets by mouth daily.   glipiZIDE (GLUCOTROL XL) 5 MG 24 hr tablet Take 1 tablet (5 mg total) by mouth daily with breakfast.   glucose blood (ACCU-CHEK GUIDE) test strip Use as instructed   icosapent Ethyl (VASCEPA) 1 g capsule Take 1 capsule (1 g total) by mouth 2 (two) times daily with a meal.   Insulin Pen Needle (B-D ULTRAFINE III SHORT PEN) 31G X 8 MM MISC 1 each by Does not apply route as directed.   levETIRAcetam (KEPPRA) 250 MG tablet Take  500 mg by mouth 2 (two) times daily.   lisinopril (ZESTRIL) 5 MG tablet Take 1 tablet (5 mg total) by mouth daily.   omeprazole (PRILOSEC) 20 MG capsule Take 1 capsule by mouth daily.   TRELEGY ELLIPTA 200-62.5-25 MCG/ACT AEPB Inhale 1 puff into the lungs daily.   TRULICITY 3 ZT/2.4PY SOPN Inject 3 mg as directed once a week.   valACYclovir (VALTREX) 500 MG tablet Take 500 mg by mouth daily.   [DISCONTINUED] glipiZIDE (GLUCOTROL XL) 2.5 MG 24 hr tablet TAKE ONE TABLET BY MOUTH ONCE DAILY   No facility-administered encounter medications on file as of 08/12/2022.    ALLERGIES: Allergies  Allergen Reactions   Codeine    Hydrocodone    Lorcet [Hydrocodone-Acetaminophen]    Percocet [Oxycodone-Acetaminophen]     VACCINATION STATUS:  There is no immunization history on file for this patient.  Diabetes She presents for her follow-up diabetic visit. She has type 2 diabetes mellitus. Onset time: She was diagnosed at approximate age of 5 years. Her disease course has been stable. There are no hypoglycemic associated  symptoms. Pertinent negatives for diabetes include no polydipsia and no polyuria. There are no hypoglycemic complications. Symptoms are improving. There are no diabetic complications. Risk factors for coronary artery disease include diabetes mellitus, dyslipidemia, sedentary lifestyle, post-menopausal and tobacco exposure. Current diabetic treatments: She is currently on metformin 500 mg p.o. twice daily, recently added Trulicity at 1.5 mg weekly. Her weight is fluctuating minimally. She is following a generally unhealthy diet. When asked about meal planning, she reported none. She has had a previous visit with a dietitian. She rarely participates in exercise. Her home blood glucose trend is decreasing steadily. Her breakfast blood glucose range is generally 130-140 mg/dl. Her bedtime blood glucose range is generally 130-140 mg/dl. Her overall blood glucose range is 130-140 mg/dl. (Patient has returns with her daughter to clinic with improved glycemic profile and point-of-care A1c is 7.7%, improving from 8.7%.  She is currently on Trulicity 3 mg p.o. weekly and glipizide 2.5 mg p.o. daily.   ) An ACE inhibitor/angiotensin II receptor blocker is not being taken. She does not see a podiatrist.Eye exam is not current.  Hyperlipidemia This is a chronic problem. Exacerbating diseases include diabetes. Current antihyperlipidemic treatment includes fibric acid derivatives and statins. Risk factors for coronary artery disease include diabetes mellitus, dyslipidemia, hypertension, post-menopausal and a sedentary lifestyle.  Hypertension This is a chronic problem. The current episode started more than 1 year ago. Risk factors for coronary artery disease include diabetes mellitus and dyslipidemia. Past treatments include beta blockers.     Review of Systems  Endocrine: Negative for polydipsia and polyuria.    Objective:       08/12/2022    3:27 PM 06/23/2022   11:20 AM 05/11/2022    2:30 PM  Vitals with BMI   Height _0  _1  _2   Weight 172 lbs 167 lbs 167 lbs  BMI 33.59 09.98 33.82  Systolic 505 397 673  Diastolic 68 63 74  Pulse 64 81 90    BP 124/68   Pulse 64   Ht 5' (1.524 m)   Wt 172 lb (78 kg)   BMI 33.59 kg/m   Wt Readings from Last 3 Encounters:  08/12/22 172 lb (78 kg)  06/23/22 167 lb (75.8 kg)  05/11/22 167 lb (75.8 kg)     Physical Exam    CMP ( most recent) CMP     Component Value Date/Time  NA 143 10/25/2021 0635   K 3.9 10/25/2021 0635   CL 108 10/25/2021 0635   CO2 25 10/25/2021 0635   GLUCOSE 99 10/25/2021 0635   BUN 17 10/25/2021 0635   BUN 12 07/23/2020 0000   CREATININE 0.71 10/25/2021 0635   CALCIUM 9.6 10/25/2021 0635   PROT 6.5 10/25/2021 0635   ALBUMIN 3.6 10/25/2021 0635   AST 39 10/25/2021 0635   ALT 20 10/25/2021 0635   ALKPHOS 43 10/25/2021 0635   BILITOT 0.4 10/25/2021 0635   GFRNONAA >60 10/25/2021 0635     Diabetic Labs (most recent): Lab Results  Component Value Date   HGBA1C 7.7 (A) 08/12/2022   HGBA1C 8.1 (A) 03/31/2022   HGBA1C 6.7 (H) 10/24/2021   MICROALBUR 8.3 07/23/2020    Lipid Panel     Component Value Date/Time   CHOL 185 04/27/2022 0000   TRIG 627 (A) 04/27/2022 0000   HDL 31 (A) 04/27/2022 0000      Assessment & Plan:   1. Type 2 diabetes mellitus with hyperglycemia, unspecified whether long term insulin use (HCC) - Kimberly Graves has currently uncontrolled symptomatic type 2 DM since  72 years of age.  Patient has returns with her daughter to clinic with improved glycemic profile and point-of-care A1c is 7.7%, improving from 8.7%.  She is currently on Trulicity 3 mg p.o. weekly and glipizide 2.5 mg p.o. daily.    - Recent labs reviewed. - I had a long discussion with her about the progressive nature of diabetes and the pathology behind its complications. -She does not report gross complications from her diabetes, however she remains at a high risk for more acute and chronic complications which  include CAD, CVA, CKD, retinopathy, and neuropathy. These are all discussed in detail with her.  - I have counseled her on diet  and weight management  by adopting a carbohydrate restricted/protein rich diet. Patient is encouraged to switch to  unprocessed or minimally processed     complex starch and increased protein intake (animal or plant source), fruits, and vegetables. -  she is advised to stick to a routine mealtimes to eat 3 meals  a day and avoid unnecessary snacks ( to snack only to correct hypoglycemia).   - she acknowledges that there is a room for improvement in her food and drink choices. - Suggestion is made for her to avoid simple carbohydrates  from her diet including Cakes, Sweet Desserts, Ice Cream, Soda (diet and regular), Sweet Tea, Candies, Chips, Cookies, Store Bought Juices, Alcohol , Artificial Sweeteners,  Coffee Creamer, and "Sugar-free" Products, Lemonade. This will help patient to have more stable blood glucose profile and potentially avoid unintended weight gain.  The following Lifestyle Medicine recommendations according to [facility]) were discussed and and offered to patient and she  agrees to start the journey:  A. Whole Foods, Plant-Based Nutrition comprising of fruits and vegetables, plant-based proteins, whole-grain carbohydrates was discussed in detail with the patient.   A list for source of those nutrients were also provided to the patient.  Patient will use only water or unsweetened tea for hydration. B.  The need to stay away from risky substances including alcohol, smoking; obtaining 7 to 9 hours of restorative sleep, at least 150 minutes of moderate intensity exercise weekly, the importance of healthy social connections,  and stress management techniques were discussed. C.  A full color page of  Calorie density of various food groups per pound showing  examples of each food groups was provided to the patient.    - she will  be scheduled with Jearld Fenton, RDN, CDE for diabetes education.  - I have approached her with the following individualized plan to manage  her diabetes and patient agrees:    -Based on her presentation with A1c of 7.7%, she will not need insulin treatment for now.  She is advised to continue Trulicity 3 milligrams subcutaneously weekly.  -She is willing and advised to continue monitoring blood glucose twice a day-daily before breakfast and at bedtime. - she is encouraged to call clinic for blood glucose levels less than 70 or above 200 mg /dl.  -She will be considered for higher dose of glipizide, 5 mg XL p.o. daily at breakfast.    - Specific targets for  A1c;  LDL, HDL,  and Triglycerides were discussed with the patient.  2) Blood Pressure /Hypertension:   Her blood pressure is controlled to target.  she is advised to continue her current blood pressure medication including carvedilol 3.25 mg p.o. twice daily.     3) Lipids/Hyperlipidemia:   Review of her recent lipid panel showed un controlled hypertriglyceridemia at 600+.    She is currently on fenofibrate 145 mg p.o. daily, as well as atorvastatin 80 mg p.o. nightly.  She is advised to continue on both.    Whole food plant-based diet will help with dyslipidemia as well as hypertension.   4)  Weight/Diet:  Body mass index is 33.59 kg/m.  -      she is  a candidate for some weight loss, recently experienced unintended weight loss.  I discussed with her the fact that loss of 5 - 10% of her  current body weight will have the most impact on her diabetes management.  Exercise, and detailed carbohydrates information provided  -  detailed on discharge instructions.  5) Chronic Care/Health Maintenance:  -she  is on  Statin medications and  is encouraged to initiate and continue to follow up with Ophthalmology, Dentist,  Podiatrist at least yearly or according to recommendations, and advised to   stay away from smoking. I have recommended  yearly flu vaccine and pneumonia vaccine at least every 5 years; moderate intensity exercise for up to 150 minutes weekly; and  sleep for at least 7 hours a day.  - she is  advised to maintain close follow up with Coolidge Breeze, FNP for primary care needs, as well as her other providers for optimal and coordinated care.   I spent 28 minutes in the care of the patient today including review of labs from [place], Lipids, Thyroid Function, Hematology (current and previous including abstractions from other facilities); face-to-face time discussing  her blood glucose readings/logs, discussing hypoglycemia and hyperglycemia episodes and symptoms, medications doses, her options of short and long term treatment based on the latest standards of care / guidelines;  discussion about incorporating lifestyle medicine;  and documenting the encounter. Risk reduction counseling performed per USPSTF guidelines to reduce  obesity and cardiovascular risk factors.     Please refer to Patient Instructions for Blood Glucose Monitoring and Insulin/Medications Dosing Guide"  in media tab for additional information. Please  also refer to " Patient Self Inventory" in the Media  tab for reviewed elements of pertinent patient history.  Kimberly Graves participated in the discussions, expressed understanding, and voiced agreement with the above plans.  All questions were answered to her satisfaction. she is encouraged to contact clinic should she have  any questions or concerns prior to her return visit.    Follow up plan: - Return in about 4 months (around 12/11/2022) for F/U with Pre-visit Labs, Meter/CGM/Logs, A1c here.  Glade Lloyd, MD [facility] [address] [place], [place] [address] Phone: [phone]  Fax: [phone]    08/12/2022, 5:37 PM  This note was partially dictated with voice recognition software. Similar sounding words can be transcribed inadequately or  may not  be corrected upon review.

## 2022-08-12 NOTE — Patient Instructions (Signed)

## 2022-08-16 ENCOUNTER — Ambulatory Visit (HOSPITAL_COMMUNITY): Payer: Medicare Other | Admitting: Physical Therapy

## 2022-08-24 ENCOUNTER — Encounter (HOSPITAL_COMMUNITY): Admission: RE | Admit: 2022-08-24 | Payer: Medicare Other | Source: Ambulatory Visit

## 2022-08-25 ENCOUNTER — Other Ambulatory Visit: Payer: Self-pay

## 2022-08-25 MED ORDER — TRULICITY 3 MG/0.5ML ~~LOC~~ SOAJ: 3.0000 mg | SUBCUTANEOUS | 2 refills | Status: DC

## 2022-08-26 ENCOUNTER — Ambulatory Visit (INDEPENDENT_AMBULATORY_CARE_PROVIDER_SITE_OTHER): Payer: Medicare Other | Admitting: Gastroenterology

## 2022-08-26 ENCOUNTER — Encounter (INDEPENDENT_AMBULATORY_CARE_PROVIDER_SITE_OTHER): Payer: Self-pay | Admitting: *Deleted

## 2022-08-26 ENCOUNTER — Encounter (INDEPENDENT_AMBULATORY_CARE_PROVIDER_SITE_OTHER): Payer: Self-pay | Admitting: Gastroenterology

## 2022-08-26 VITALS — BP 145/78 | HR 87 | Temp 98.6°F | Ht 60.0 in | Wt 172.7 lb

## 2022-08-26 DIAGNOSIS — R7989 Other specified abnormal findings of blood chemistry: Secondary | ICD-10-CM

## 2022-08-26 DIAGNOSIS — Z85048 Personal history of other malignant neoplasm of rectum, rectosigmoid junction, and anus: Secondary | ICD-10-CM

## 2022-08-26 NOTE — Patient Instructions (Addendum)
Schedule colonoscopy in 3-4 weeks If feeling too congested/wheezing in 1 week, please call our office to reschedule your colonoscopy Perform blood workup The patient was found to have elevated blood pressure when vital signs were checked in the office. The blood pressure was rechecked by the nursing staff and it was found be persistently elevated >140/90 mmHg. I personally advised to the patient to follow up closely with PCP for hypertension control.

## 2022-08-26 NOTE — Progress Notes (Signed)
Maylon Peppers, M.D. Gastroenterology & Hepatology [facility] [address] [place], [place] [address] Primary Care Physician: [place], [address] [place] [address]  Referring MD: PCP  Chief Complaint:  History of rectal cancer  History of Present Illness: Kimberly Graves is a 72 y.o. female with Pmh rectal cancer s/p resection, neoadjuvant CRT, Takotsubo cardiomyopathy, diabetes c/b neuropathy, COPD on oxygen at home, small cell lung cancer s/p chemoradiation in 2007, cervical cancer s/p hysterectomy, who presents for evaluation for evaluation after treatment of rectal cancer.  Patient denies any complaints. States she is due for repeat colonoscopy. Can take 1-2 pills of Imodium as needed for loose Bms as this may be followed by incontinence but overall feels her stools are regular. The patient denies having any nausea, vomiting, fever, chills, hematochezia, melena, hematemesis, abdominal distention, abdominal pain, diarrhea, jaundice, pruritus or weight loss.  Notably, daughter reports that she may have had a mild flare of her COPD recently and has had some congestion in her lungs for the last couple days.  Denies any shortness of breath or wheezing.  Most recent labs from 04/15/2022 showed a CMP with AST 65, ALT 38, alkaline phosphatase 58, total bilirubin 0.3, normal renal function and electrolytes, CBC with WBC 6.0, hemoglobin 13.7, platelets 273.  CMP in March 2023 showed an AST of 39 and ALT of 20.  Most recent [place] on 04/27/2022 were 627.  Regarding her rectal cancer history:  Per medical chart, done by Dr. Helene Kelp in 2019 showed a mass in the lower rectum rectum. Biopsy showing high-grade dysplasia with suspicion for invasion.  2. CT CAP April 2019 IMPRESSION:  1. No definite evidence of metastatic disease. 2. Indeterminant 4 mm left adrenal nodule, recommend attention on follow-up imaging. 3. Indeterminant left adnexal  hypodensity measuring 2.8 x 3.9 x 3.5 cm, possibly representing an ovarian cyst. Recommend further catheterization with ultrasound. 4. Hepatic steatosis. 5. Diverticulosis.  3. MRI pelvis May 2019 Within the low rectum, just above the anorectal angle, there is a polyploid enhancing soft  tissue mass involving the posterior lateral rectum, slightly eccentric to the left and measuring approximately 2.7 x 2.3 x 3.4 cm. At the superior margin of this mass, at 6:00, there is a triangular-shaped focus of soft tissue signal extending into the mesial rectal fat which measures approximate 6 mm in length from the rectal wall, suspicious for neoplastic invasion of the mesorectal fat (axial image 88 series 9). This soft tissue is greater than 1 cm from the posterior mesorectal fascial margin. The left lateral wall of the lower rectum at the level of the neoplasm contacts the left levator ani muscle, however there is no evidence for invasion into or beyond the muscle (see axial images 107 through 109 on series 9). There are at least 9 lymph nodes in the mesial rectal fat at and just above the level of the rectal mass, with the 2 largest lymph nodes measuring 0.7 cm and 0.5 cm. These 2 dominant lymph nodes are suspicious for neoplastic involvement while the other lymph nodes measure only 2-3 mm and are indeterminate. Well above the rectal mass, at the level of the mid sacrum, there are 4 additional lymph nodes which are indeterminate, with the largest measuring 0.9 cm (image 23 series 9). No common iliac adenopathy. There is extensive sigmoid diverticulosis. In the left adnexa there is a 3.5 x 2.6 cm mildly complex cyst which contains a 6 mm daughter cyst  internally. No enhancing internal nodularity. The right ovary is not visualized. Status post hysterectomy. There are degenerative changes of the spine. IMPRESSION: 1. Approximately 2.7 x 2.3 x 3.4 cm enhancing solid neoplasm involving the low rectum,  posterior -lateral wall, which is slightly eccentric to the left. At 6:00 there is a approximately 6 mm of ingrowth into the mesorectal fat. The inferolateral margin of the rectum at the site of the neoplasm contacts the left levator ani muscle without evidence of invasion into or beyond the muscle. There are 2 suspicious perirectal lymph nodes and multiple additional perirectal and presacral lymph nodes which are indeterminate. These findings are consistent with a T3c rectal neoplasm with at least N1 and possible N2 nodal disease. 2. 3.5 x 2.6 cm mildly complex left adnexal cystic lesion, probably benign. Follow-up pelvic sonography is recommended in 4-6 months. 3. Extensive sigmoid diverticulosis.  4. Repeat biopsy 01/13/18- colonic tissue with fragments of adenocarcinoma, moderately differentiated.   5. MRI Pelvis 01/16/18 - T3 N+ low rectal tumor. There is a focal extraluminal invasion within the left posterolateral rectum with tumor signal abutting but not frank invading the left puborectalis muscle. No involvement of the anal sphincter complex.  6. CT CAP 05/22/18- 1. No evidence of metastatic disease within the abdomen or pelvis. The known rectal tumor is difficult to visualize on the current CT and likely related to post treatment change. 2. Increased bibasilar groundglass opacities which may be infectious or inflammatory. 3. Sequela of prior radiation therapy in the right upper lobe  7. S/p LAR 06/12/2018- Tumor Site: Rectum  Tumor Location: Entirely below the anterior peritoneal reflection  Histologic Type: Adenocarcinoma  Histologic Grade: G2: Moderately differentiated  Tumor Size: Greatest dimension in Centimeters (cm): 1.7 Centimeters (cm) Tumor Deposits: Not identified  Tumor Extent:  Tumor Extension: Tumor invades muscularis propria  Macroscopic Tumor Perforation: Not identified  Accessory Findings:  Lymphovascular Invasion: Not identified  Perineural Invasion: Present  Treatment  Effect: Present  : Residual cancer with evident tumor regression, but more than single cells or rare small groups of cancer cells (partial response, score 2)  MARGINS Margins:  Proximal Margin: Uninvolved by invasive carcinoma  Distance of Tumor from Margin: 20 Centimeters (cm) Distal Margin: Uninvolved by invasive carcinoma  Distance of Tumor from Margin: 1.0 Centimeters (cm) Radial or Mesenteric Margin: Uninvolved by invasive carcinoma  Distance of Tumor from Margin: 0.8 Centimeters (cm) LYMPH NODES Number of Lymph Nodes Involved: 0  Number of Lymph Nodes Examined: 22  PATHOLOGIC STAGE CLASSIFICATION (pTNM, AJCC 8th Edition) TNM Descriptors: y (post-treatment)  Primary Tumor (pT): pT2  Regional Lymph Nodes (pN): pN0  Several of the lymph node candidates contain calcification and necrosis which may represent treatment effect.   8. MRi pelivis- Impression: Status post low anterior resection with patent anastomosis and no definite residual tumor. Thickening and enhancement in the rectum is favored to represent post-surgical changes. Attention on follow-up.   9. Also underwent neoadjuvant on 01/24/2018 [place] '1500mg'$  BID - decreased to '1000mg'$  BID week 2 (Dr Meda Coffee). She also had radiotherapy prior to her surgery - 30 sessions.  Currently followed by Dr. Elder Cyphers at [facility].  FHx: neg for any gastrointestinal/liver disease, mother colon cancer with multiple mets  diagnosed at age 25,brother lung cancer Social: smoked for 40 years but quit 2007, neg alcohol or illicit drug use Surgical: rectal cancer surgery, hysterectomy  Past Medical History: Past Medical History:  Diagnosis Date   Diabetes mellitus, type II ([place])    Rectal  cancer [facility])     Past Surgical History: Past Surgical History:  Procedure Laterality Date   BREAST BIOPSY Right 08/04/2022   MM RT BREAST BX W LOC DEV 1ST LESION IMAGE BX SPEC STEREO GUIDE 08/04/2022 [facility]    Family History: Family History   Problem Relation Age of Onset   Hypertension Mother    Cancer Mother    Hypertension Father     Social History: Social History   Tobacco Use  Smoking Status Former   Types: Cigarettes   Quit date: 2007   Years since quitting: 17.0   Passive exposure: Past  Smokeless Tobacco Not on file   Social History   Substance and Sexual Activity  Alcohol Use Not Currently   Social History   Substance and Sexual Activity  Drug Use Never    Allergies: Allergies  Allergen Reactions   Codeine    Hydrocodone    Lorcet [Hydrocodone-Acetaminophen]    Percocet [Oxycodone-Acetaminophen]     Medications: Current Outpatient Medications  Medication Sig Dispense Refill   acetaminophen (TYLENOL) 500 MG tablet Take 2 tablets by mouth 2 (two) times daily.     albuterol (VENTOLIN HFA) 108 (90 Base) MCG/ACT inhaler Inhale 1 puff into the lungs every 6 (six) hours as needed for shortness of breath.     aspirin 81 MG chewable tablet Chew 81 mg by mouth daily.     atorvastatin (LIPITOR) 80 MG tablet Take 1 tablet by mouth daily.     Blood Glucose Monitoring Suppl (ACCU-CHEK GUIDE ME) w/Device KIT 1 Piece by Does not apply route as directed. 1 kit 0   busPIRone (BUSPAR) 10 MG tablet Take 20 mg by mouth 2 (two) times daily.     carvedilol (COREG) 3.125 MG tablet Take 3.125 mg by mouth 2 (two) times daily.     citalopram (CELEXA) 20 MG tablet Take 20 mg by mouth daily.     Dulaglutide (TRULICITY) 3 PX/1.0GY SOPN Inject 3 mg as directed once a week. 2 mL 2   fenofibrate (TRICOR) 145 MG tablet Take 2 tablets by mouth daily.     glipiZIDE (GLUCOTROL XL) 5 MG 24 hr tablet Take 1 tablet (5 mg total) by mouth daily with breakfast. 90 tablet 1   glucose blood (ACCU-CHEK GUIDE) test strip Use as instructed 100 each 2   icosapent Ethyl (VASCEPA) 1 g capsule Take 1 capsule (1 g total) by mouth 2 (two) times daily with a meal. 180 capsule 1   Insulin Pen Needle (B-D ULTRAFINE III SHORT PEN) 31G X 8 MM MISC 1  each by Does not apply route as directed. 50 each 1   levETIRAcetam (KEPPRA) 250 MG tablet Take 500 mg by mouth 2 (two) times daily.     lisinopril (ZESTRIL) 5 MG tablet Take 1 tablet (5 mg total) by mouth daily.     omeprazole (PRILOSEC) 20 MG capsule Take 1 capsule by mouth daily.     TRELEGY ELLIPTA 200-62.5-25 MCG/ACT AEPB Inhale 1 puff into the lungs daily.     valACYclovir (VALTREX) 500 MG tablet Take 500 mg by mouth daily.     No current facility-administered medications for this visit.    Review of Systems: GENERAL: negative for malaise, night sweats HEENT: No changes in hearing or vision, no nose bleeds or other nasal problems. NECK: Negative for lumps, goiter, pain and significant neck swelling RESPIRATORY: Negative for cough, wheezing CARDIOVASCULAR: Negative for chest pain, leg swelling, palpitations, orthopnea GI: SEE HPI MUSCULOSKELETAL: Negative for  joint pain or swelling, back pain, and muscle pain. SKIN: Negative for lesions, rash PSYCH: Negative for sleep disturbance, mood disorder and recent psychosocial stressors. HEMATOLOGY Negative for prolonged bleeding, bruising easily, and swollen nodes. ENDOCRINE: Negative for cold or heat intolerance, polyuria, polydipsia and goiter. NEURO: negative for tremor, gait imbalance, syncope and seizures. The remainder of the review of systems is noncontributory.   Physical Exam: BP (!) 145/78 (BP Location: Left Arm, Patient Position: Sitting, Cuff Size: Large)   Pulse 87   Temp 98.6 F (37 C) (Oral)   Ht 5' (1.524 m)   Wt 172 lb 11.2 oz (78.3 kg)   BMI 33.73 kg/m  GENERAL: The patient is AO x3, in no acute distress.  Sitting in wheelchair. HEENT: Head is normocephalic and atraumatic. EOMI are intact. Mouth is well hydrated and without lesions. NECK: Supple. No masses LUNGS: Has some rales in both lung fields. HEART: RRR, normal s1 and s2. ABDOMEN: Soft, nontender, no guarding, no peritoneal signs, and nondistended. BS +.  No masses. EXTREMITIES: Without any cyanosis, clubbing, rash, lesions or edema. NEUROLOGIC: AOx3, no focal motor deficit. SKIN: no jaundice, no rashes   Imaging/Labs: as above  I personally reviewed and interpreted the available labs, imaging and endoscopic files.  Impression and Plan: Kimberly Graves is a 72 y.o. female with Pmh rectal cancer s/p resection, neoadjuvant CRT, Takotsubo cardiomyopathy, diabetes c/b neuropathy, COPD on oxygen at home, small cell lung cancer s/p chemoradiation in 2007, cervical cancer s/p hysterectomy, who presents for evaluation for evaluation after treatment of rectal cancer.  Patient underwent successful treatment of her rectal cancer and based on most recent colonoscopy she was in remission.  She is due for colorectal cancer surveillance with a repeat colonoscopy which will be scheduled today.  As he has presented some respiratory complaints for the last couple of days, I advised the patient and the family member to keep an eye on this.  If she were to have worsening symptoms, she will need to reach her pulmonologist/PCP or be evaluated in the ER emergently.  I advised him that if her respiratory condition deteriorates, the procedure will need to be rescheduled.  Finally, she had some mild elevation in her aminotransferases, which may be a transient episode.  I will recheck a CMP today.  The patient was found to have elevated blood pressure when vital signs were checked in the office. The blood pressure was rechecked by the nursing staff and it was found be persistently elevated >140/90 mmHg. I personally advised to the patient to follow up closely with PCP for hypertension control.  -Schedule colonoscopy in 3-4 weeks -Check CMP  All questions were answered.      Maylon Peppers, MD Gastroenterology and Hepatology [facility]

## 2022-08-27 LAB — COMPREHENSIVE METABOLIC PANEL
AG Ratio: 1.6 (calc) (ref 1.0–2.5)
ALT: 33 U/L — ABNORMAL HIGH (ref 6–29)
AST: 42 U/L — ABNORMAL HIGH (ref 10–35)
Albumin: 4.1 g/dL (ref 3.6–5.1)
Alkaline phosphatase (APISO): 46 U/L (ref 37–153)
BUN: 9 mg/dL (ref 7–25)
CO2: 25 mmol/L (ref 20–32)
Calcium: 10.4 mg/dL (ref 8.6–10.4)
Chloride: 106 mmol/L (ref 98–110)
Creat: 0.78 mg/dL (ref 0.60–1.00)
Globulin: 2.6 g/dL (calc) (ref 1.9–3.7)
Glucose, Bld: 145 mg/dL — ABNORMAL HIGH (ref 65–139)
Potassium: 4.5 mmol/L (ref 3.5–5.3)
Sodium: 141 mmol/L (ref 135–146)
Total Bilirubin: 0.3 mg/dL (ref 0.2–1.2)
Total Protein: 6.7 g/dL (ref 6.1–8.1)

## 2022-09-07 ENCOUNTER — Encounter (INDEPENDENT_AMBULATORY_CARE_PROVIDER_SITE_OTHER): Payer: Self-pay

## 2022-09-28 ENCOUNTER — Telehealth (INDEPENDENT_AMBULATORY_CARE_PROVIDER_SITE_OTHER): Payer: Self-pay | Admitting: *Deleted

## 2022-09-28 NOTE — Telephone Encounter (Signed)
-----   Message from Josue Hector sent at 09/28/2022  8:54 AM EST ----- This patient's daughter LM that she is going to reschedule.  I have canceled the PAT and pulled into the depot.  Thanks,

## 2022-09-28 NOTE — Telephone Encounter (Signed)
LMOVM to call back for pt daughter Elmyra Ricks

## 2022-09-30 ENCOUNTER — Encounter (HOSPITAL_COMMUNITY): Payer: Medicare Other

## 2022-09-30 ENCOUNTER — Encounter (INDEPENDENT_AMBULATORY_CARE_PROVIDER_SITE_OTHER): Payer: Self-pay | Admitting: *Deleted

## 2022-09-30 NOTE — Telephone Encounter (Signed)
Spoke with daughter. Rescheduled to 3/26 at [facility] will send new instructions/pre-op appt,

## 2022-10-19 ENCOUNTER — Encounter (HOSPITAL_COMMUNITY)
Admission: RE | Admit: 2022-10-19 | Discharge: 2022-10-19 | Disposition: A | Discharge status: Home / Self Care | Payer: Medicare Other | Source: Ambulatory Visit | Attending: Critical Care Medicine | Admitting: Critical Care Medicine

## 2022-10-19 VITALS — BP 116/72 | HR 81 | Ht 60.0 in | Wt 172.0 lb

## 2022-10-19 DIAGNOSIS — J449 Chronic obstructive pulmonary disease, unspecified: Secondary | ICD-10-CM | POA: Insufficient documentation

## 2022-10-19 NOTE — Progress Notes (Signed)
Pulmonary Individual Treatment Plan  Patient Details  Name: Kimberly Graves MRN: [id number] Date of Birth: Feb 12, 1951 Referring Provider:   Flowsheet Row PULMONARY REHAB COPD ORIENTATION from 10/19/2022 in [place]  Referring Provider Dr. Merlene Laughter       Initial Encounter Date:  [place] from 10/19/2022 in [place]  Date 10/19/22       Visit Diagnosis: Chronic obstructive pulmonary disease, unspecified COPD type ([place])  Patient's Home Medications on Admission:   Current Outpatient Medications:    acetaminophen (TYLENOL) 500 MG tablet, Take 2 tablets by mouth 2 (two) times daily., Disp: , Rfl:    albuterol (VENTOLIN HFA) 108 (90 Base) MCG/ACT inhaler, Inhale 1 puff into the lungs every 6 (six) hours as needed for shortness of breath., Disp: , Rfl:    aspirin 81 MG chewable tablet, Chew 81 mg by mouth daily., Disp: , Rfl:    atorvastatin (LIPITOR) 80 MG tablet, Take 1 tablet by mouth daily., Disp: , Rfl:    busPIRone (BUSPAR) 10 MG tablet, Take 20 mg by mouth 2 (two) times daily., Disp: , Rfl:    carvedilol (COREG) 3.125 MG tablet, Take 3.125 mg by mouth 2 (two) times daily., Disp: , Rfl:    citalopram (CELEXA) 20 MG tablet, Take 20 mg by mouth daily., Disp: , Rfl:    Dulaglutide (TRULICITY) 3 0000000 SOPN, Inject 3 mg as directed once a week., Disp: 2 mL, Rfl: 2   fenofibrate (TRICOR) 145 MG tablet, Take 2 tablets by mouth daily., Disp: , Rfl:    ibuprofen (ADVIL) 200 MG tablet, Take 400 mg by mouth at bedtime., Disp: , Rfl:    icosapent Ethyl (VASCEPA) 1 g capsule, Take 1 capsule (1 g total) by mouth 2 (two) times daily with a meal., Disp: 180 capsule, Rfl: 1   levETIRAcetam (KEPPRA) 250 MG tablet, Take 500 mg by mouth 2 (two) times daily., Disp: , Rfl:    Multiple Vitamin (MULTIVITAMIN) capsule, Take 1 capsule by mouth daily., Disp: , Rfl:    omeprazole (PRILOSEC) 20 MG capsule, Take 1  capsule by mouth daily., Disp: , Rfl:    pregabalin (LYRICA) 75 MG capsule, Take 75 mg by mouth 2 (two) times daily., Disp: , Rfl:    TRELEGY ELLIPTA 200-62.5-25 MCG/ACT AEPB, Inhale 1 puff into the lungs daily., Disp: , Rfl:    Blood Glucose Monitoring Suppl (ACCU-CHEK GUIDE ME) w/Device KIT, 1 Piece by Does not apply route as directed., Disp: 1 kit, Rfl: 0   glipiZIDE (GLUCOTROL XL) 5 MG 24 hr tablet, Take 1 tablet (5 mg total) by mouth daily with breakfast. (Patient not taking: Reported on 10/19/2022), Disp: 90 tablet, Rfl: 1   glucose blood (ACCU-CHEK GUIDE) test strip, Use as instructed, Disp: 100 each, Rfl: 2   Insulin Pen Needle (B-D ULTRAFINE III SHORT PEN) 31G X 8 MM MISC, 1 each by Does not apply route as directed., Disp: 50 each, Rfl: 1   lisinopril (ZESTRIL) 5 MG tablet, Take 1 tablet (5 mg total) by mouth daily., Disp: , Rfl:   Past Medical History: Past Medical History:  Diagnosis Date   Diabetes mellitus, type II ([place])    Rectal cancer ([place])     Tobacco Use: Social History   Tobacco Use  Smoking Status Former   Types: Cigarettes   Quit date: 2007   Years since quitting: 17.2   Passive exposure: Past  Smokeless Tobacco Not on file  Labs: Review Flowsheet  More data exists      Latest Ref Rng & Units 11/26/2020 10/24/2021 03/31/2022 04/27/2022 08/12/2022  Labs for ITP Cardiac and Pulmonary Rehab  Cholestrol 0 - 200 - - - 185     -  HDL-C 35 - 70 - - - 31     -  Trlycerides 40 - 160 - - - 627     -  Hemoglobin A1c 0.0 - 7.0 % 12.1  6.7  8.1  - 7.7     Details       This result is from an external source.         Capillary Blood Glucose: Lab Results  Component Value Date   GLUCAP 168 (H) 10/25/2021   GLUCAP 98 10/25/2021    POCT Glucose     Row Name 10/19/22 1340             POCT Blood Glucose   Pre-Exercise 149 mg/dL                Pulmonary Assessment Scores:  Pulmonary Assessment Scores     Row Name 10/19/22 1255         ADL UCSD    ADL Phase Entry     SOB Score total 88     Rest 2     Walk 4     Stairs 5     Bath 4     Dress 3     Shop 3       CAT Score   CAT Score 33       mMRC Score   mMRC Score 4             UCSD: Self-administered rating of dyspnea associated with activities of daily living (ADLs) 6-point scale (0 = "not at all" to 5 = "maximal or unable to do because of breathlessness")  Scoring Scores range from 0 to 120.  Minimally important difference is 5 units  CAT: CAT can identify the health impairment of COPD patients and is better correlated with disease progression.  CAT has a scoring range of zero to 40. The CAT score is classified into four groups of low (less than 10), medium (10 - 20), high (21-30) and very high (31-40) based on the impact level of disease on health status. A CAT score over 10 suggests significant symptoms.  A worsening CAT score could be explained by an exacerbation, poor medication adherence, poor inhaler technique, or progression of COPD or comorbid conditions.  CAT MCID is 2 points  mMRC: mMRC (Modified Medical Research Council) Dyspnea Scale is used to assess the degree of baseline functional disability in patients of respiratory disease due to dyspnea. No minimal important difference is established. A decrease in score of 1 point or greater is considered a positive change.   Pulmonary Function Assessment:   Exercise Target Goals: Exercise Program Goal: Individual exercise prescription set using results from initial 6 min walk test and THRR while considering  patient's activity barriers and safety.   Exercise Prescription Goal: Initial exercise prescription builds to 30-45 minutes a day of aerobic activity, 2-3 days per week.  Home exercise guidelines will be given to patient during program as part of exercise prescription that the participant will acknowledge.  Activity Barriers & Risk Stratification:  Activity Barriers & Cardiac Risk Stratification -  10/19/22 1257       Activity Barriers & Cardiac Risk Stratification   Activity Barriers Arthritis;Back Problems;Joint Problems;Deconditioning;Shortness of Breath;Balance  Concerns;History of Falls;Assistive Device    Cardiac Risk Stratification High             6 Minute Walk:  6 Minute Walk     Row Name 10/19/22 1433         6 Minute Walk   Phase Initial     Distance 450 feet     Walk Time 6 minutes     # of Rest Breaks 1     MPH 0.85     RPE 11     Perceived Dyspnea  11     Symptoms Yes (comment)     Comments 1 seated rest break for 1 min 20 sec due to generalized fatigue. Reported right knee pain 4/10     Resting HR 81 bpm     Resting BP 116/72     Resting Oxygen Saturation  94 %     Exercise Oxygen Saturation  during 6 min walk 92 %     Max Ex. HR 94 bpm     Max Ex. BP 144/74     2 Minute Post BP 116/70       Interval HR   1 Minute HR 85     2 Minute HR 83     3 Minute HR 90     4 Minute HR 85     5 Minute HR 90     6 Minute HR 94     2 Minute Post HR 79     Interval Heart Rate? Yes       Interval Oxygen   Interval Oxygen? Yes     Baseline Oxygen Saturation % 94 %     1 Minute Oxygen Saturation % 95 %     1 Minute Liters of Oxygen 0 L     2 Minute Oxygen Saturation % 92 %     2 Minute Liters of Oxygen 0 L     3 Minute Oxygen Saturation % 92 %     3 Minute Liters of Oxygen 0 L     4 Minute Oxygen Saturation % 93 %     4 Minute Liters of Oxygen 0 L     5 Minute Oxygen Saturation % 93 %     5 Minute Liters of Oxygen 0 L     6 Minute Oxygen Saturation % 93 %     6 Minute Liters of Oxygen 0 L     2 Minute Post Oxygen Saturation % 95 %     2 Minute Post Liters of Oxygen 0 L              Oxygen Initial Assessment:  Oxygen Initial Assessment - 10/19/22 1432       Home Oxygen   Home Oxygen Device Home Concentrator    Sleep Oxygen Prescription Continuous    Liters per minute 3    Home Exercise Oxygen Prescription None    Home Resting Oxygen  Prescription None    Compliance with Home Oxygen Use No      Initial 6 min Walk   Oxygen Used None      Program Oxygen Prescription   Program Oxygen Prescription None      Intervention   Short Term Goals To learn and understand importance of monitoring SPO2 with pulse oximeter and demonstrate accurate use of the pulse oximeter.;To learn and understand importance of maintaining oxygen saturations>88%;To learn and demonstrate proper pursed lip breathing techniques or other breathing techniques.  Long  Term Goals Verbalizes importance of monitoring SPO2 with pulse oximeter and return demonstration;Maintenance of O2 saturations>88%;Exhibits proper breathing techniques, such as pursed lip breathing or other method taught during program session;Compliance with respiratory medication             Oxygen Re-Evaluation:   Oxygen Discharge (Final Oxygen Re-Evaluation):   Initial Exercise Prescription:  Initial Exercise Prescription - 10/19/22 1400       Date of Initial Exercise RX and Referring Provider   Date 10/19/22    Referring Provider Dr. Merlene Laughter    Expected Discharge Date 02/24/23      NuStep   Level 1    SPM 80    Minutes 39      Prescription Details   Frequency (times per week) 2    Duration Progress to 30 minutes of continuous aerobic without signs/symptoms of physical distress      Intensity   THRR 40-80% of Max Heartrate 59-118    Ratings of Perceived Exertion 11-13    Perceived Dyspnea 0-4      Resistance Training   Training Prescription Yes    Weight 2    Reps 10-15             Perform Capillary Blood Glucose checks as needed.  Exercise Prescription Changes:   Exercise Comments:   Exercise Goals and Review:   Exercise Goals     Row Name 10/19/22 1443             Exercise Goals   Increase Physical Activity Yes       Intervention Provide advice, education, support and counseling about physical activity/exercise needs.;Develop  an individualized exercise prescription for aerobic and resistive training based on initial evaluation findings, risk stratification, comorbidities and participant's personal goals.       Expected Outcomes Short Term: Attend rehab on a regular basis to increase amount of physical activity.;Long Term: Add in home exercise to make exercise part of routine and to increase amount of physical activity.;Long Term: Exercising regularly at least 3-5 days a week.       Increase Strength and Stamina Yes       Intervention Provide advice, education, support and counseling about physical activity/exercise needs.;Develop an individualized exercise prescription for aerobic and resistive training based on initial evaluation findings, risk stratification, comorbidities and participant's personal goals.       Expected Outcomes Short Term: Increase workloads from initial exercise prescription for resistance, speed, and METs.;Short Term: Perform resistance training exercises routinely during rehab and add in resistance training at home;Long Term: Improve cardiorespiratory fitness, muscular endurance and strength as measured by increased METs and functional capacity (6MWT)       Able to understand and use rate of perceived exertion (RPE) scale Yes       Intervention Provide education and explanation on how to use RPE scale       Expected Outcomes Short Term: Able to use RPE daily in rehab to express subjective intensity level;Long Term:  Able to use RPE to guide intensity level when exercising independently       Able to understand and use Dyspnea scale Yes       Intervention Provide education and explanation on how to use Dyspnea scale       Expected Outcomes Short Term: Able to use Dyspnea scale daily in rehab to express subjective sense of shortness of breath during exertion;Long Term: Able to use Dyspnea scale to guide intensity level when exercising independently  Knowledge and understanding of Target Heart Rate  Range (THRR) Yes       Intervention Provide education and explanation of THRR including how the numbers were predicted and where they are located for reference       Expected Outcomes Short Term: Able to state/look up THRR;Short Term: Able to use daily as guideline for intensity in rehab;Long Term: Able to use THRR to govern intensity when exercising independently       Understanding of Exercise Prescription Yes       Intervention Provide education, explanation, and written materials on patient's individual exercise prescription       Expected Outcomes Long Term: Able to explain home exercise prescription to exercise independently;Short Term: Able to explain program exercise prescription                Exercise Goals Re-Evaluation :   Discharge Exercise Prescription (Final Exercise Prescription Changes):   Nutrition:  Target Goals: Understanding of nutrition guidelines, daily intake of sodium '1500mg'$ , cholesterol '200mg'$ , calories 30% from fat and 7% or less from saturated fats, daily to have 5 or more servings of fruits and vegetables.  Biometrics:  Pre Biometrics - 10/19/22 1450       Pre Biometrics   Height 5' (1.524 m)    Weight 171 lb 15.3 oz (78 kg)    Waist Circumference 44.5 inches    Hip Circumference 45.5 inches    Waist to Hip Ratio 0.98 %    BMI (Calculated) 33.58    Triceps Skinfold 43 mm    % Body Fat 49.1 %    Grip Strength 43 kg    Flexibility 0 in    Single Leg Stand 0 seconds              Nutrition Therapy Plan and Nutrition Goals:  Nutrition Therapy & Goals - 10/19/22 1306       Personal Nutrition Goals   Comments She does not follow a specific diet. She does not follow a diabetic diet. She scored a 45 on her MEDFICTs.             Nutrition Assessments:  Nutrition Assessments - 10/19/22 1315       MEDFICTS Scores   Pre Score 45            MEDIFICTS Score Key: ?70 Need to make dietary changes  40-70 Heart Healthy Diet ? 40  Therapeutic Level Cholesterol Diet   Picture Your Plate Scores: [id number] Unhealthy dietary pattern with much room for improvement. 41-50 Dietary pattern unlikely to meet recommendations for good health and room for improvement. 51-60 More healthful dietary pattern, with some room for improvement.  >60 Healthy dietary pattern, although there may be some specific behaviors that could be improved.    Nutrition Goals Re-Evaluation:   Nutrition Goals Discharge (Final Nutrition Goals Re-Evaluation):   Psychosocial: Target Goals: Acknowledge presence or absence of significant depression and/or stress, maximize coping skills, provide positive support system. Participant is able to verbalize types and ability to use techniques and skills needed for reducing stress and depression.  Initial Review & Psychosocial Screening:  Initial Psych Review & Screening - 10/19/22 1301       Initial Review   Current issues with Current Depression;Current Sleep Concerns      Family Dynamics   Good Support System? Yes    Comments Her son, daugher, son in law, grandson, and sister support her.      Barriers   Psychosocial barriers to participate  in program There are no identifiable barriers or psychosocial needs.      Screening Interventions   Interventions Encouraged to exercise;Provide feedback about the scores to participant    Expected Outcomes Long Term goal: The participant improves quality of Life and PHQ9 Scores as seen by post scores and/or verbalization of changes;Short Term goal: Identification and review with participant of any Quality of Life or Depression concerns found by scoring the questionnaire.             Quality of Life Scores:  Quality of Life - 10/19/22 1432       Quality of Life   Select Quality of Life      Quality of Life Scores   Health/Function Pre 13.13 %    Socioeconomic Pre 26.92 %    Psych/Spiritual Pre 26.57 %    Family Pre 30 %    GLOBAL Pre 20.77 %             Scores of 19 and below usually indicate a poorer quality of life in these areas.  A difference of  2-3 points is a clinically meaningful difference.  A difference of 2-3 points in the total score of the Quality of Life Index has been associated with significant improvement in overall quality of life, self-image, physical symptoms, and general health in studies assessing change in quality of life.   PHQ-9: Review Flowsheet       10/19/2022  Depression screen PHQ 2/9  Decreased Interest 1  Down, Depressed, Hopeless 1  PHQ - 2 Score 2  Altered sleeping 3  Tired, decreased energy 3  Change in appetite 1  Feeling bad or failure about yourself  1  Trouble concentrating 1  Moving slowly or fidgety/restless 0  Suicidal thoughts 0  PHQ-9 Score 11  Difficult doing work/chores Somewhat difficult   Interpretation of Total Score  Total Score Depression Severity:  1-4 = Minimal depression, 5-9 = Mild depression, 10-14 = Moderate depression, 15-19 = Moderately severe depression, 20-27 = Severe depression   Psychosocial Evaluation and Intervention:  Psychosocial Evaluation - 10/19/22 1454       Psychosocial Evaluation & Interventions   Interventions Stress management education;Relaxation education;Encouraged to exercise with the program and follow exercise prescription    Comments Pt has no barriers to participate in PR. She has no identifiable psychosocial issues. She does have depression, but she states that it is well managed with buspirone 20 mg twice daily and citalopram 20 mg once daily. Her husband passed away back in January 09, 2022, but she reports that she has been able to cope well with this. She is with her daughter, Elmyra Ricks, today. Elmyra Ricks is her primary caregiver and takes her to all of her appointments. Pt also reports problems with her sleep, but she does not take anything for her sleep. She scored a 14 on her PHQ-9, and most of this relates to her depression and the effects of her  COPD. She reports not being able to go out much due to her SOB. Outside of her daughter, she also lists her son, son in law, grandson, and sister as her support system. Her goals while in the program are to decrease her SOB with exertion and to gain her confidence back. She would like to be more confident that she can do activities such as shopping or walking up stairs without becoming SOB. She is eager to start the program.    Expected Outcomes Pt's depression will continue to be treated,  and she will have no other identifiable psychosocial issues.    Continue Psychosocial Services  No Follow up required             Psychosocial Re-Evaluation:   Psychosocial Discharge (Final Psychosocial Re-Evaluation):    Education: Education Goals: Education classes will be provided on a weekly basis, covering required topics. Participant will state understanding/return demonstration of topics presented.  Learning Barriers/Preferences:  Learning Barriers/Preferences - 10/19/22 1326       Learning Barriers/Preferences   Learning Barriers None    Learning Preferences Written Material             Education Topics: How Lungs Work and Diseases: - Discuss the anatomy of the lungs and diseases that can affect the lungs, such as COPD.   Exercise: -Discuss the importance of exercise, FITT principles of exercise, normal and abnormal responses to exercise, and how to exercise safely.   Environmental Irritants: -Discuss types of environmental irritants and how to limit exposure to environmental irritants.   Meds/Inhalers and oxygen: - Discuss respiratory medications, definition of an inhaler and oxygen, and the proper way to use an inhaler and oxygen.   Energy Saving Techniques: - Discuss methods to conserve energy and decrease shortness of breath when performing activities of daily living.    Bronchial Hygiene / Breathing Techniques: - Discuss breathing mechanics, pursed-lip breathing  technique,  proper posture, effective ways to clear airways, and other functional breathing techniques   Cleaning Equipment: - Provides group verbal and written instruction about the health risks of elevated stress, cause of high stress, and healthy ways to reduce stress.   Nutrition I: Fats: - Discuss the types of cholesterol, what cholesterol does to the body, and how cholesterol levels can be controlled.   Nutrition II: Labels: -Discuss the different components of food labels and how to read food labels.   Respiratory Infections: - Discuss the signs and symptoms of respiratory infections, ways to prevent respiratory infections, and the importance of seeking medical treatment when having a respiratory infection.   Stress I: Signs and Symptoms: - Discuss the causes of stress, how stress may lead to anxiety and depression, and ways to limit stress.   Stress II: Relaxation: -Discuss relaxation techniques to limit stress.   Oxygen for Home/Travel: - Discuss how to prepare for travel when on oxygen and proper ways to transport and store oxygen to ensure safety.   Knowledge Questionnaire Score:  Knowledge Questionnaire Score - 10/19/22 1327       Knowledge Questionnaire Score   Pre Score 0/18, did not complete, but reviewed all correct answers with patient             Core Components/Risk Factors/Patient Goals at Admission:  Personal Goals and Risk Factors at Admission - 10/19/22 1336       Core Components/Risk Factors/Patient Goals on Admission   Improve shortness of breath with ADL's Yes    Intervention Provide education, individualized exercise plan and daily activity instruction to help decrease symptoms of SOB with activities of daily living.    Expected Outcomes Short Term: Improve cardiorespiratory fitness to achieve a reduction of symptoms when performing ADLs;Long Term: Be able to perform more ADLs without symptoms or delay the onset of symptoms    Increase  knowledge of respiratory medications and ability to use respiratory devices properly  Yes    Intervention Provide education and demonstration as needed of appropriate use of medications, inhalers, and oxygen therapy.    Expected Outcomes Short Term: Achieves  understanding of medications use. Understands that oxygen is a medication prescribed by physician. Demonstrates appropriate use of inhaler and oxygen therapy.;Long Term: Maintain appropriate use of medications, inhalers, and oxygen therapy.    Diabetes Yes    Intervention Provide education about signs/symptoms and action to take for hypo/hyperglycemia.;Provide education about proper nutrition, including hydration, and aerobic/resistive exercise prescription along with prescribed medications to achieve blood glucose in normal ranges: Fasting glucose 65-99 mg/dL    Expected Outcomes Short Term: Participant verbalizes understanding of the signs/symptoms and immediate care of hyper/hypoglycemia, proper foot care and importance of medication, aerobic/resistive exercise and nutrition plan for blood glucose control.;Long Term: Attainment of HbA1C < 7%.    Personal Goal Other Yes    Personal Goal Increase confidence in doing things for herself and going out more.    Intervention Attend PR two days per week and begin a home exercise program.    Expected Outcomes Pt will meet stated goals.             Core Components/Risk Factors/Patient Goals Review:    Core Components/Risk Factors/Patient Goals at Discharge (Final Review):    ITP Comments:   Comments: Patient arrived for 1st visit/orientation/education at 1230. Patient was referred to PR by Dr. Merlene Laughter due to COPD, unspecified. During orientation advised patient on arrival and appointment times what to wear, what to do before, during and after exercise. Reviewed attendance and class policy.  Pt is scheduled to return Pulm Rehab on 10/26/2022 at 1500. Pt was advised to come to class 15  minutes before class starts.  Discussed RPE/Dpysnea scales. Patient participated in warm up stretches. Patient was able to complete 6 minute walk test. Patient was measured for the equipment. Discussed equipment safety with patient. Took patient pre-anthropometric measurements. Patient finished visit at 1415.

## 2022-10-25 NOTE — Patient Instructions (Signed)
Kimberly Graves  10/25/2022     @PREFPERIOPPHARMACY @   Your procedure is scheduled on 11/02/2022.   Report to [facility] at  0700 A.M.   Call this number if you have problems the morning of surgery:  [phone]  If you experience any cold or flu symptoms such as cough, fever, chills, shortness of breath, etc. between now and your scheduled surgery, please notify us at the above number.   Remember:  Follow the diet and prep instructions given to you by the office.     Your last dose of trulicity should have been on 3/18 or before.       DO NOT take any medications for diabetes the morning of your procedure.     Use your inhaler before you come and bring your rescue inhaler with you.    Take these medicines the morning of surgery with A SIP OF WATER       buspar, carvedilol, celexa, keppra, omeprazole, lyrica.     Do not wear jewelry, make-up or nail polish.  Do not wear lotions, powders, or perfumes, or deodorant.  Do not shave 48 hours prior to surgery.  Men may shave face and neck.  Do not bring valuables to the hospital.  [facility] is not responsible for any belongings or valuables.  Contacts, dentures or bridgework may not be worn into surgery.  Leave your suitcase in the car.  After surgery it may be brought to your room.  For patients admitted to the hospital, discharge time will be determined by your treatment team.  Patients discharged the day of surgery will not be allowed to drive home and must have someone with them for 24 hours.    Special instructions:   DO NOT smoke tobacco or vape for 24 hours before your procedure.  Please read over the following fact sheets that you were given. Anesthesia Post-op Instructions and Care and Recovery After Surgery      Colonoscopy, Adult, Care After The following information offers guidance on how to care for yourself after your procedure. Your health care provider may also give you more specific  instructions. If you have problems or questions, contact your health care provider. What can I expect after the procedure? After the procedure, it is common to have: A small amount of blood in your stool for 24 hours after the procedure. Some gas. Mild cramping or bloating of your abdomen. Follow these instructions at home: Eating and drinking  Drink enough fluid to keep your urine pale yellow. Follow instructions from your health care provider about eating or drinking restrictions. Resume your normal diet as told by your health care provider. Avoid heavy or fried foods that are hard to digest. Activity Rest as told by your health care provider. Avoid sitting for a long time without moving. Get up to take short walks every 1-2 hours. This is important to improve blood flow and breathing. Ask for help if you feel weak or unsteady. Return to your normal activities as told by your health care provider. Ask your health care provider what activities are safe for you. Managing cramping and bloating  Try walking around when you have cramps or feel bloated. If directed, apply heat to your abdomen as told by your health care provider. Use the heat source that your health care provider recommends, such as a moist heat pack or a heating pad. Place a towel between your skin and the heat source. Leave the  heat on for 20-30 minutes. Remove the heat if your skin turns bright red. This is especially important if you are unable to feel pain, heat, or cold. You have a greater risk of getting burned. General instructions If you were given a sedative during the procedure, it can affect you for several hours. Do not drive or operate machinery until your health care provider says that it is safe. For the first 24 hours after the procedure: Do not sign important documents. Do not drink alcohol. Do your regular daily activities at a slower pace than normal. Eat soft foods that are easy to digest. Take  over-the-counter and prescription medicines only as told by your health care provider. Keep all follow-up visits. This is important. Contact a health care provider if: You have blood in your stool 2-3 days after the procedure. Get help right away if: You have more than a small spotting of blood in your stool. You have large blood clots in your stool. You have swelling of your abdomen. You have nausea or vomiting. You have a fever. You have increasing pain in your abdomen that is not relieved with medicine. These symptoms may be an emergency. Get help right away. Call 911. Do not wait to see if the symptoms will go away. Do not drive yourself to the hospital. Summary After the procedure, it is common to have a small amount of blood in your stool. You may also have mild cramping and bloating of your abdomen. If you were given a sedative during the procedure, it can affect you for several hours. Do not drive or operate machinery until your health care provider says that it is safe. Get help right away if you have a lot of blood in your stool, nausea or vomiting, a fever, or increased pain in your abdomen. This information is not intended to replace advice given to you by your health care provider. Make sure you discuss any questions you have with your health care provider. Document Revised: 03/18/2021 Document Reviewed: 03/18/2021 Elsevier Patient Education  [place] After The following information offers guidance on how to care for yourself after your procedure. Your health care provider may also give you more specific instructions. If you have problems or questions, contact your health care provider. What can I expect after the procedure? After the procedure, it is common to have: Tiredness. Little or no memory about what happened during or after the procedure. Impaired judgment when it comes to making decisions. Nausea or vomiting. Some trouble  with balance. Follow these instructions at home: For the time period you were told by your health care provider:  Rest. Do not participate in activities where you could fall or become injured. Do not drive or use machinery. Do not drink alcohol. Do not take sleeping pills or medicines that cause drowsiness. Do not make important decisions or sign legal documents. Do not take care of children on your own. Medicines Take over-the-counter and prescription medicines only as told by your health care provider. If you were prescribed antibiotics, take them as told by your health care provider. Do not stop using the antibiotic even if you start to feel better. Eating and drinking Follow instructions from your health care provider about what you may eat and drink. Drink enough fluid to keep your urine pale yellow. If you vomit: Drink clear fluids slowly and in small amounts as you are able. Clear fluids include water, ice chips, low-calorie sports drinks, and  fruit juice that has water added to it (diluted fruit juice). Eat light and bland foods in small amounts as you are able. These foods include bananas, applesauce, rice, lean meats, toast, and crackers. General instructions  Have a responsible adult stay with you for the time you are told. It is important to have someone help care for you until you are awake and alert. If you have sleep apnea, surgery and some medicines can increase your risk for breathing problems. Follow instructions from your health care provider about wearing your sleep device: When you are sleeping. This includes during daytime naps. While taking prescription pain medicines, sleeping medicines, or medicines that make you drowsy. Do not use any products that contain nicotine or tobacco. These products include cigarettes, chewing tobacco, and vaping devices, such as e-cigarettes. If you need help quitting, ask your health care provider. Contact a health care provider  if: You feel nauseous or vomit every time you eat or drink. You feel light-headed. You are still sleepy or having trouble with balance after 24 hours. You get a rash. You have a fever. You have redness or swelling around the IV site. Get help right away if: You have trouble breathing. You have new confusion after you get home. These symptoms may be an emergency. Get help right away. Call 911. Do not wait to see if the symptoms will go away. Do not drive yourself to the hospital. This information is not intended to replace advice given to you by your health care provider. Make sure you discuss any questions you have with your health care provider. Document Revised: 12/21/2021 Document Reviewed: 12/21/2021 Elsevier Patient Education  [place].

## 2022-10-26 ENCOUNTER — Encounter (HOSPITAL_COMMUNITY): Payer: Medicare Other

## 2022-10-27 ENCOUNTER — Telehealth (INDEPENDENT_AMBULATORY_CARE_PROVIDER_SITE_OTHER): Payer: Self-pay | Admitting: *Deleted

## 2022-10-27 NOTE — Telephone Encounter (Signed)
-----   Message from Josue Hector sent at 10/27/2022  1:09 PM EDT ----- This patient's daughter LM that they need to cancel and reschedule due to pt being sick.  Daughter stated she was going to call the office.  I have canceled the PAT and pulled into the depot.  Thanks,

## 2022-10-27 NOTE — Telephone Encounter (Signed)
Pt daughter left voicemail in regards to rescheduling procedure.  Attempted to contact daughter but had to leave message on voicemail.

## 2022-10-28 ENCOUNTER — Encounter (HOSPITAL_COMMUNITY): Payer: Medicare Other

## 2022-10-28 ENCOUNTER — Encounter (HOSPITAL_COMMUNITY)
Admission: RE | Admit: 2022-10-28 | Discharge: 2022-10-28 | Disposition: A | Discharge status: Home / Self Care | Payer: Medicare Other | Source: Ambulatory Visit | Attending: Gastroenterology | Admitting: Gastroenterology

## 2022-10-28 ENCOUNTER — Encounter (HOSPITAL_COMMUNITY): Payer: Self-pay

## 2022-10-28 DIAGNOSIS — E1122 Type 2 diabetes mellitus with diabetic chronic kidney disease: Secondary | ICD-10-CM

## 2022-10-29 NOTE — Telephone Encounter (Signed)
Left message to return call.  Will mail letter.

## 2022-10-30 ENCOUNTER — Other Ambulatory Visit: Payer: Self-pay | Admitting: "Endocrinology

## 2022-11-02 ENCOUNTER — Encounter (HOSPITAL_COMMUNITY): Admission: RE | Payer: Self-pay | Source: Home / Self Care

## 2022-11-02 ENCOUNTER — Encounter (HOSPITAL_COMMUNITY): Payer: Medicare Other

## 2022-11-02 ENCOUNTER — Ambulatory Visit (HOSPITAL_COMMUNITY): Admission: RE | Admit: 2022-11-02 | Payer: Medicare Other | Source: Home / Self Care | Admitting: Gastroenterology

## 2022-11-02 SURGERY — COLONOSCOPY WITH PROPOFOL
Anesthesia: Monitor Anesthesia Care

## 2022-11-03 NOTE — Progress Notes (Signed)
Pulmonary Individual Treatment Plan  Patient Details  Name: Kimberly Graves MRN: [id number] Date of Birth: 08-12-1950 Referring Provider:   Flowsheet Row PULMONARY REHAB COPD ORIENTATION from 10/19/2022 in [place]  Referring Provider Dr. Merlene Laughter       Initial Encounter Date:  [place] from 10/19/2022 in [place]  Date 10/19/22       Visit Diagnosis: Chronic obstructive pulmonary disease, unspecified COPD type ([place])  Patient's Home Medications on Admission:   Current Outpatient Medications:    acetaminophen (TYLENOL) 500 MG tablet, Take 2 tablets by mouth 2 (two) times daily., Disp: , Rfl:    albuterol (VENTOLIN HFA) 108 (90 Base) MCG/ACT inhaler, Inhale 1 puff into the lungs every 6 (six) hours as needed for shortness of breath., Disp: , Rfl:    aspirin 81 MG chewable tablet, Chew 81 mg by mouth daily., Disp: , Rfl:    atorvastatin (LIPITOR) 80 MG tablet, Take 1 tablet by mouth daily., Disp: , Rfl:    busPIRone (BUSPAR) 10 MG tablet, Take 20 mg by mouth 2 (two) times daily., Disp: , Rfl:    carvedilol (COREG) 3.125 MG tablet, Take 3.125 mg by mouth 2 (two) times daily., Disp: , Rfl:    citalopram (CELEXA) 20 MG tablet, Take 20 mg by mouth daily., Disp: , Rfl:    Dulaglutide (TRULICITY) 3 0000000 SOPN, Inject 3 mg as directed once a week., Disp: 2 mL, Rfl: 2   fenofibrate (TRICOR) 145 MG tablet, Take 2 tablets by mouth daily., Disp: , Rfl:    ibuprofen (ADVIL) 200 MG tablet, Take 400 mg by mouth at bedtime., Disp: , Rfl:    levETIRAcetam (KEPPRA) 250 MG tablet, Take 500 mg by mouth 2 (two) times daily., Disp: , Rfl:    Multiple Vitamin (MULTIVITAMIN) capsule, Take 1 capsule by mouth daily., Disp: , Rfl:    omeprazole (PRILOSEC) 20 MG capsule, Take 1 capsule by mouth daily., Disp: , Rfl:    pregabalin (LYRICA) 75 MG capsule, Take 75 mg by mouth 2 (two) times daily., Disp: , Rfl:    TRELEGY  ELLIPTA 200-62.5-25 MCG/ACT AEPB, Inhale 1 puff into the lungs daily., Disp: , Rfl:    Blood Glucose Monitoring Suppl (ACCU-CHEK GUIDE ME) w/Device KIT, 1 Piece by Does not apply route as directed., Disp: 1 kit, Rfl: 0   glipiZIDE (GLUCOTROL XL) 5 MG 24 hr tablet, Take 1 tablet (5 mg total) by mouth daily with breakfast. (Patient not taking: Reported on 10/19/2022), Disp: 90 tablet, Rfl: 1   glucose blood (ACCU-CHEK GUIDE) test strip, Use as instructed, Disp: 100 each, Rfl: 2   Insulin Pen Needle (B-D ULTRAFINE III SHORT PEN) 31G X 8 MM MISC, 1 each by Does not apply route as directed., Disp: 50 each, Rfl: 1   lisinopril (ZESTRIL) 5 MG tablet, Take 1 tablet (5 mg total) by mouth daily., Disp: , Rfl:    VASCEPA 1 g capsule, TAKE ONE CAPSULE BY MOUTH TWICE DAILY WITH FOOD, Disp: 60 capsule, Rfl: 5  Past Medical History: Past Medical History:  Diagnosis Date   Diabetes mellitus, type II ([place])    Rectal cancer ([place])     Tobacco Use: Social History   Tobacco Use  Smoking Status Former   Types: Cigarettes   Quit date: 2007   Years since quitting: 17.2   Passive exposure: Past  Smokeless Tobacco Not on file    Labs: Review Flowsheet  More data exists  Latest Ref Rng & Units 11/26/2020 10/24/2021 03/31/2022 04/27/2022 08/12/2022  Labs for ITP Cardiac and Pulmonary Rehab  Cholestrol 0 - 200 - - - 185     -  HDL-C 35 - 70 - - - 31     -  Trlycerides 40 - 160 - - - 627     -  Hemoglobin A1c 0.0 - 7.0 % 12.1  6.7  8.1  - 7.7     Details       This result is from an external source.         Capillary Blood Glucose: Lab Results  Component Value Date   GLUCAP 168 (H) 10/25/2021   GLUCAP 98 10/25/2021    POCT Glucose     Row Name 10/19/22 1340             POCT Blood Glucose   Pre-Exercise 149 mg/dL                Pulmonary Assessment Scores:  Pulmonary Assessment Scores     Row Name 10/19/22 1255         ADL UCSD   ADL Phase Entry     SOB Score total 88      Rest 2     Walk 4     Stairs 5     Bath 4     Dress 3     Shop 3       CAT Score   CAT Score 33       mMRC Score   mMRC Score 4             UCSD: Self-administered rating of dyspnea associated with activities of daily living (ADLs) 6-point scale (0 = "not at all" to 5 = "maximal or unable to do because of breathlessness")  Scoring Scores range from 0 to 120.  Minimally important difference is 5 units  CAT: CAT can identify the health impairment of COPD patients and is better correlated with disease progression.  CAT has a scoring range of zero to 40. The CAT score is classified into four groups of low (less than 10), medium (10 - 20), high (21-30) and very high (31-40) based on the impact level of disease on health status. A CAT score over 10 suggests significant symptoms.  A worsening CAT score could be explained by an exacerbation, poor medication adherence, poor inhaler technique, or progression of COPD or comorbid conditions.  CAT MCID is 2 points  mMRC: mMRC (Modified Medical Research Council) Dyspnea Scale is used to assess the degree of baseline functional disability in patients of respiratory disease due to dyspnea. No minimal important difference is established. A decrease in score of 1 point or greater is considered a positive change.   Pulmonary Function Assessment:   Exercise Target Goals: Exercise Program Goal: Individual exercise prescription set using results from initial 6 min walk test and THRR while considering  patient's activity barriers and safety.   Exercise Prescription Goal: Initial exercise prescription builds to 30-45 minutes a day of aerobic activity, 2-3 days per week.  Home exercise guidelines will be given to patient during program as part of exercise prescription that the participant will acknowledge.  Activity Barriers & Risk Stratification:  Activity Barriers & Cardiac Risk Stratification - 10/19/22 1257       Activity Barriers &  Cardiac Risk Stratification   Activity Barriers Arthritis;Back Problems;Joint Problems;Deconditioning;Shortness of Breath;Balance Concerns;History of Falls;Assistive Device    Cardiac Risk Stratification High  6 Minute Walk:  6 Minute Walk     Row Name 10/19/22 1433         6 Minute Walk   Phase Initial     Distance 450 feet     Walk Time 6 minutes     # of Rest Breaks 1     MPH 0.85     RPE 11     Perceived Dyspnea  11     Symptoms Yes (comment)     Comments 1 seated rest break for 1 min 20 sec due to generalized fatigue. Reported right knee pain 4/10     Resting HR 81 bpm     Resting BP 116/72     Resting Oxygen Saturation  94 %     Exercise Oxygen Saturation  during 6 min walk 92 %     Max Ex. HR 94 bpm     Max Ex. BP 144/74     2 Minute Post BP 116/70       Interval HR   1 Minute HR 85     2 Minute HR 83     3 Minute HR 90     4 Minute HR 85     5 Minute HR 90     6 Minute HR 94     2 Minute Post HR 79     Interval Heart Rate? Yes       Interval Oxygen   Interval Oxygen? Yes     Baseline Oxygen Saturation % 94 %     1 Minute Oxygen Saturation % 95 %     1 Minute Liters of Oxygen 0 L     2 Minute Oxygen Saturation % 92 %     2 Minute Liters of Oxygen 0 L     3 Minute Oxygen Saturation % 92 %     3 Minute Liters of Oxygen 0 L     4 Minute Oxygen Saturation % 93 %     4 Minute Liters of Oxygen 0 L     5 Minute Oxygen Saturation % 93 %     5 Minute Liters of Oxygen 0 L     6 Minute Oxygen Saturation % 93 %     6 Minute Liters of Oxygen 0 L     2 Minute Post Oxygen Saturation % 95 %     2 Minute Post Liters of Oxygen 0 L              Oxygen Initial Assessment:  Oxygen Initial Assessment - 10/19/22 1432       Home Oxygen   Home Oxygen Device Home Concentrator    Sleep Oxygen Prescription Continuous    Liters per minute 3    Home Exercise Oxygen Prescription None    Home Resting Oxygen Prescription None    Compliance with Home  Oxygen Use No      Initial 6 min Walk   Oxygen Used None      Program Oxygen Prescription   Program Oxygen Prescription None      Intervention   Short Term Goals To learn and understand importance of monitoring SPO2 with pulse oximeter and demonstrate accurate use of the pulse oximeter.;To learn and understand importance of maintaining oxygen saturations>88%;To learn and demonstrate proper pursed lip breathing techniques or other breathing techniques.     Long  Term Goals Verbalizes importance of monitoring SPO2 with pulse oximeter and return demonstration;Maintenance of O2 saturations>88%;Exhibits proper breathing techniques,  such as pursed lip breathing or other method taught during program session;Compliance with respiratory medication             Oxygen Re-Evaluation:  Oxygen Re-Evaluation     Row Name 11/03/22 0730             Program Oxygen Prescription   Program Oxygen Prescription None         Home Oxygen   Home Oxygen Device Home Concentrator       Sleep Oxygen Prescription Continuous       Liters per minute 3       Home Exercise Oxygen Prescription None       Home Resting Oxygen Prescription None       Compliance with Home Oxygen Use No         Goals/Expected Outcomes   Short Term Goals To learn and understand importance of monitoring SPO2 with pulse oximeter and demonstrate accurate use of the pulse oximeter.;To learn and understand importance of maintaining oxygen saturations>88%;To learn and demonstrate proper pursed lip breathing techniques or other breathing techniques.        Long  Term Goals Verbalizes importance of monitoring SPO2 with pulse oximeter and return demonstration;Maintenance of O2 saturations>88%;Exhibits proper breathing techniques, such as pursed lip breathing or other method taught during program session;Compliance with respiratory medication       Goals/Expected Outcomes compliance                Oxygen Discharge (Final Oxygen  Re-Evaluation):  Oxygen Re-Evaluation - 11/03/22 0730       Program Oxygen Prescription   Program Oxygen Prescription None      Home Oxygen   Home Oxygen Device Home Concentrator    Sleep Oxygen Prescription Continuous    Liters per minute 3    Home Exercise Oxygen Prescription None    Home Resting Oxygen Prescription None    Compliance with Home Oxygen Use No      Goals/Expected Outcomes   Short Term Goals To learn and understand importance of monitoring SPO2 with pulse oximeter and demonstrate accurate use of the pulse oximeter.;To learn and understand importance of maintaining oxygen saturations>88%;To learn and demonstrate proper pursed lip breathing techniques or other breathing techniques.     Long  Term Goals Verbalizes importance of monitoring SPO2 with pulse oximeter and return demonstration;Maintenance of O2 saturations>88%;Exhibits proper breathing techniques, such as pursed lip breathing or other method taught during program session;Compliance with respiratory medication    Goals/Expected Outcomes compliance             Initial Exercise Prescription:  Initial Exercise Prescription - 10/19/22 1400       Date of Initial Exercise RX and Referring Provider   Date 10/19/22    Referring Provider Dr. Merlene Laughter    Expected Discharge Date 02/24/23      NuStep   Level 1    SPM 80    Minutes 39      Prescription Details   Frequency (times per week) 2    Duration Progress to 30 minutes of continuous aerobic without signs/symptoms of physical distress      Intensity   THRR 40-80% of Max Heartrate 59-118    Ratings of Perceived Exertion 11-13    Perceived Dyspnea 0-4      Resistance Training   Training Prescription Yes    Weight 2    Reps 10-15             Perform Capillary Blood  Glucose checks as needed.  Exercise Prescription Changes:   Exercise Comments:   Exercise Goals and Review:   Exercise Goals     Row Name 10/19/22 1443 11/03/22 0730            Exercise Goals   Increase Physical Activity Yes Yes      Intervention Provide advice, education, support and counseling about physical activity/exercise needs.;Develop an individualized exercise prescription for aerobic and resistive training based on initial evaluation findings, risk stratification, comorbidities and participant's personal goals. Provide advice, education, support and counseling about physical activity/exercise needs.;Develop an individualized exercise prescription for aerobic and resistive training based on initial evaluation findings, risk stratification, comorbidities and participant's personal goals.      Expected Outcomes Short Term: Attend rehab on a regular basis to increase amount of physical activity.;Long Term: Add in home exercise to make exercise part of routine and to increase amount of physical activity.;Long Term: Exercising regularly at least 3-5 days a week. Short Term: Attend rehab on a regular basis to increase amount of physical activity.;Long Term: Add in home exercise to make exercise part of routine and to increase amount of physical activity.;Long Term: Exercising regularly at least 3-5 days a week.      Increase Strength and Stamina Yes Yes      Intervention Provide advice, education, support and counseling about physical activity/exercise needs.;Develop an individualized exercise prescription for aerobic and resistive training based on initial evaluation findings, risk stratification, comorbidities and participant's personal goals. Provide advice, education, support and counseling about physical activity/exercise needs.;Develop an individualized exercise prescription for aerobic and resistive training based on initial evaluation findings, risk stratification, comorbidities and participant's personal goals.      Expected Outcomes Short Term: Increase workloads from initial exercise prescription for resistance, speed, and METs.;Short Term: Perform  resistance training exercises routinely during rehab and add in resistance training at home;Long Term: Improve cardiorespiratory fitness, muscular endurance and strength as measured by increased METs and functional capacity (6MWT) Short Term: Increase workloads from initial exercise prescription for resistance, speed, and METs.;Short Term: Perform resistance training exercises routinely during rehab and add in resistance training at home;Long Term: Improve cardiorespiratory fitness, muscular endurance and strength as measured by increased METs and functional capacity (6MWT)      Able to understand and use rate of perceived exertion (RPE) scale Yes Yes      Intervention Provide education and explanation on how to use RPE scale Provide education and explanation on how to use RPE scale      Expected Outcomes Short Term: Able to use RPE daily in rehab to express subjective intensity level;Long Term:  Able to use RPE to guide intensity level when exercising independently Short Term: Able to use RPE daily in rehab to express subjective intensity level;Long Term:  Able to use RPE to guide intensity level when exercising independently      Able to understand and use Dyspnea scale Yes Yes      Intervention Provide education and explanation on how to use Dyspnea scale Provide education and explanation on how to use Dyspnea scale      Expected Outcomes Short Term: Able to use Dyspnea scale daily in rehab to express subjective sense of shortness of breath during exertion;Long Term: Able to use Dyspnea scale to guide intensity level when exercising independently Short Term: Able to use Dyspnea scale daily in rehab to express subjective sense of shortness of breath during exertion;Long Term: Able to use Dyspnea scale to guide intensity  level when exercising independently      Knowledge and understanding of Target Heart Rate Range (THRR) Yes Yes      Intervention Provide education and explanation of THRR including how the  numbers were predicted and where they are located for reference Provide education and explanation of THRR including how the numbers were predicted and where they are located for reference      Expected Outcomes Short Term: Able to state/look up THRR;Short Term: Able to use daily as guideline for intensity in rehab;Long Term: Able to use THRR to govern intensity when exercising independently Short Term: Able to state/look up THRR;Short Term: Able to use daily as guideline for intensity in rehab;Long Term: Able to use THRR to govern intensity when exercising independently      Understanding of Exercise Prescription Yes Yes      Intervention Provide education, explanation, and written materials on patient's individual exercise prescription Provide education, explanation, and written materials on patient's individual exercise prescription      Expected Outcomes Long Term: Able to explain home exercise prescription to exercise independently;Short Term: Able to explain program exercise prescription Long Term: Able to explain home exercise prescription to exercise independently;Short Term: Able to explain program exercise prescription               Exercise Goals Re-Evaluation :  Exercise Goals Re-Evaluation     [place] Name 11/03/22 0731             Exercise Goal Re-Evaluation   Exercise Goals Review Increase Physical Activity;Able to understand and use rate of perceived exertion (RPE) scale;Increase Strength and Stamina;Able to understand and use Dyspnea scale;Knowledge and understanding of Target Heart Rate Range (THRR);Understanding of Exercise Prescription       Comments Pt has not attended a PR session yet. Her daughter reports that she has pneumonia, so she has missed her first two weeks. She is hopeful to begin 4/2. Unable to assess at this time due to lack of attendance.       Expected Outcomes Through exercise at rehab and at home, the patient will meet their stated goals.                 Discharge Exercise Prescription (Final Exercise Prescription Changes):   Nutrition:  Target Goals: Understanding of nutrition guidelines, daily intake of sodium 1500mg , cholesterol 200mg , calories 30% from fat and 7% or less from saturated fats, daily to have 5 or more servings of fruits and vegetables.  Biometrics:  Pre Biometrics - 10/19/22 1450       Pre Biometrics   Height 5' (1.524 m)    Weight 78 kg    Waist Circumference 44.5 inches    Hip Circumference 45.5 inches    Waist to Hip Ratio 0.98 %    BMI (Calculated) 33.58    Triceps Skinfold 43 mm    % Body Fat 49.1 %    Grip Strength 43 kg    Flexibility 0 in    Single Leg Stand 0 seconds              Nutrition Therapy Plan and Nutrition Goals:  Nutrition Therapy & Goals - 10/20/22 0737       Personal Nutrition Goals   Comments Patient scored 45 on diet assessment. We provide educational material on heart healthy nutrition with handouts and assistance with RD referral if patient is interested.      Intervention Plan   Intervention Nutrition handout(s) given to patient.  Nutrition Assessments:  Nutrition Assessments - 10/19/22 1315       MEDFICTS Scores   Pre Score 45            MEDIFICTS Score Key: ?70 Need to make dietary changes  40-70 Heart Healthy Diet ? 40 Therapeutic Level Cholesterol Diet   Picture Your Plate Scores: [id number] Unhealthy dietary pattern with much room for improvement. 41-50 Dietary pattern unlikely to meet recommendations for good health and room for improvement. 51-60 More healthful dietary pattern, with some room for improvement.  >60 Healthy dietary pattern, although there may be some specific behaviors that could be improved.    Nutrition Goals Re-Evaluation:   Nutrition Goals Discharge (Final Nutrition Goals Re-Evaluation):   Psychosocial: Target Goals: Acknowledge presence or absence of significant depression and/or stress, maximize coping  skills, provide positive support system. Participant is able to verbalize types and ability to use techniques and skills needed for reducing stress and depression.  Initial Review & Psychosocial Screening:  Initial Psych Review & Screening - 10/19/22 1301       Initial Review   Current issues with Current Depression;Current Sleep Concerns      Family Dynamics   Good Support System? Yes    Comments Her son, daugher, son in law, grandson, and sister support her.      Barriers   Psychosocial barriers to participate in program There are no identifiable barriers or psychosocial needs.      Screening Interventions   Interventions Encouraged to exercise;Provide feedback about the scores to participant    Expected Outcomes Long Term goal: The participant improves quality of Life and PHQ9 Scores as seen by post scores and/or verbalization of changes;Short Term goal: Identification and review with participant of any Quality of Life or Depression concerns found by scoring the questionnaire.             Quality of Life Scores:  Quality of Life - 10/19/22 1432       Quality of Life   Select Quality of Life      Quality of Life Scores   Health/Function Pre 13.13 %    Socioeconomic Pre 26.92 %    Psych/Spiritual Pre 26.57 %    Family Pre 30 %    GLOBAL Pre 20.77 %            Scores of 19 and below usually indicate a poorer quality of life in these areas.  A difference of  2-3 points is a clinically meaningful difference.  A difference of 2-3 points in the total score of the Quality of Life Index has been associated with significant improvement in overall quality of life, self-image, physical symptoms, and general health in studies assessing change in quality of life.   PHQ-9: Review Flowsheet       10/19/2022  Depression screen PHQ 2/9  Decreased Interest 1  Down, Depressed, Hopeless 1  PHQ - 2 Score 2  Altered sleeping 3  Tired, decreased energy 3  Change in appetite 1   Feeling bad or failure about yourself  1  Trouble concentrating 1  Moving slowly or fidgety/restless 0  Suicidal thoughts 0  PHQ-9 Score 11  Difficult doing work/chores Somewhat difficult   Interpretation of Total Score  Total Score Depression Severity:  1-4 = Minimal depression, 5-9 = Mild depression, 10-14 = Moderate depression, 15-19 = Moderately severe depression, 20-27 = Severe depression   Psychosocial Evaluation and Intervention:  Psychosocial Evaluation - 10/19/22 1454  Psychosocial Evaluation & Interventions   Interventions Stress management education;Relaxation education;Encouraged to exercise with the program and follow exercise prescription    Comments Pt has no barriers to participate in PR. She has no identifiable psychosocial issues. She does have depression, but she states that it is well managed with buspirone 20 mg twice daily and citalopram 20 mg once daily. Her husband passed away back in Jan 23, 2022, but she reports that she has been able to cope well with this. She is with her daughter, Elmyra Ricks, today. Elmyra Ricks is her primary caregiver and takes her to all of her appointments. Pt also reports problems with her sleep, but she does not take anything for her sleep. She scored a 14 on her PHQ-9, and most of this relates to her depression and the effects of her COPD. She reports not being able to go out much due to her SOB. Outside of her daughter, she also lists her son, son in law, grandson, and sister as her support system. Her goals while in the program are to decrease her SOB with exertion and to gain her confidence back. She would like to be more confident that she can do activities such as shopping or walking up stairs without becoming SOB. She is eager to start the program.    Expected Outcomes Pt's depression will continue to be treated, and she will have no other identifiable psychosocial issues.    Continue Psychosocial Services  No Follow up required              Psychosocial Re-Evaluation:  Psychosocial Re-Evaluation     [place] Name 10/25/22 0922             Psychosocial Re-Evaluation   Current issues with History of Depression;None Identified;Current Psychotropic Meds       Comments Patient has not started class yet, plans to this week.  Her initial PHQ-7 score is 14 and QOL score is 20.77%.  Patient was referred to PR with COPD via Dr. Ronalee Red.  Her personal goals are to decrease her SOB with exertion, go up stairs without SOB and gain confidence to go back shopping.       Expected Outcomes Patient will have no psychosocial issuses identified at discharge.       Interventions Stress management education;Relaxation education;Encouraged to attend Pulmonary Rehabilitation for the exercise       Continue Psychosocial Services  No Follow up required                Psychosocial Discharge (Final Psychosocial Re-Evaluation):  Psychosocial Re-Evaluation - 10/25/22 [id number]       Psychosocial Re-Evaluation   Current issues with History of Depression;None Identified;Current Psychotropic Meds    Comments Patient has not started class yet, plans to this week.  Her initial PHQ-7 score is 14 and QOL score is 20.77%.  Patient was referred to PR with COPD via Dr. Ronalee Red.  Her personal goals are to decrease her SOB with exertion, go up stairs without SOB and gain confidence to go back shopping.    Expected Outcomes Patient will have no psychosocial issuses identified at discharge.    Interventions Stress management education;Relaxation education;Encouraged to attend Pulmonary Rehabilitation for the exercise    Continue Psychosocial Services  No Follow up required              Education: Education Goals: Education classes will be provided on a weekly basis, covering required topics. Participant will state understanding/return demonstration of topics presented.  Learning Barriers/Preferences:  Learning Barriers/Preferences - 10/19/22 1326        Learning Barriers/Preferences   Learning Barriers None    Learning Preferences Written Material             Education Topics: How Lungs Work and Diseases: - Discuss the anatomy of the lungs and diseases that can affect the lungs, such as COPD.   Exercise: -Discuss the importance of exercise, FITT principles of exercise, normal and abnormal responses to exercise, and how to exercise safely.   Environmental Irritants: -Discuss types of environmental irritants and how to limit exposure to environmental irritants.   Meds/Inhalers and oxygen: - Discuss respiratory medications, definition of an inhaler and oxygen, and the proper way to use an inhaler and oxygen.   Energy Saving Techniques: - Discuss methods to conserve energy and decrease shortness of breath when performing activities of daily living.    Bronchial Hygiene / Breathing Techniques: - Discuss breathing mechanics, pursed-lip breathing technique,  proper posture, effective ways to clear airways, and other functional breathing techniques   Cleaning Equipment: - Provides group verbal and written instruction about the health risks of elevated stress, cause of high stress, and healthy ways to reduce stress.   Nutrition I: Fats: - Discuss the types of cholesterol, what cholesterol does to the body, and how cholesterol levels can be controlled.   Nutrition II: Labels: -Discuss the different components of food labels and how to read food labels.   Respiratory Infections: - Discuss the signs and symptoms of respiratory infections, ways to prevent respiratory infections, and the importance of seeking medical treatment when having a respiratory infection.   Stress I: Signs and Symptoms: - Discuss the causes of stress, how stress may lead to anxiety and depression, and ways to limit stress.   Stress II: Relaxation: -Discuss relaxation techniques to limit stress.   Oxygen for Home/Travel: - Discuss how to prepare  for travel when on oxygen and proper ways to transport and store oxygen to ensure safety.   Knowledge Questionnaire Score:  Knowledge Questionnaire Score - 10/19/22 1327       Knowledge Questionnaire Score   Pre Score 0/18, did not complete, but reviewed all correct answers with patient             Core Components/Risk Factors/Patient Goals at Admission:  Personal Goals and Risk Factors at Admission - 10/19/22 1336       Core Components/Risk Factors/Patient Goals on Admission   Improve shortness of breath with ADL's Yes    Intervention Provide education, individualized exercise plan and daily activity instruction to help decrease symptoms of SOB with activities of daily living.    Expected Outcomes Short Term: Improve cardiorespiratory fitness to achieve a reduction of symptoms when performing ADLs;Long Term: Be able to perform more ADLs without symptoms or delay the onset of symptoms    Increase knowledge of respiratory medications and ability to use respiratory devices properly  Yes    Intervention Provide education and demonstration as needed of appropriate use of medications, inhalers, and oxygen therapy.    Expected Outcomes Short Term: Achieves understanding of medications use. Understands that oxygen is a medication prescribed by physician. Demonstrates appropriate use of inhaler and oxygen therapy.;Long Term: Maintain appropriate use of medications, inhalers, and oxygen therapy.    Diabetes Yes    Intervention Provide education about signs/symptoms and action to take for hypo/hyperglycemia.;Provide education about proper nutrition, including hydration, and aerobic/resistive exercise prescription along with prescribed medications to achieve blood glucose in normal ranges:  Fasting glucose 65-99 mg/dL    Expected Outcomes Short Term: Participant verbalizes understanding of the signs/symptoms and immediate care of hyper/hypoglycemia, proper foot care and importance of medication,  aerobic/resistive exercise and nutrition plan for blood glucose control.;Long Term: Attainment of HbA1C < 7%.    Personal Goal Other Yes    Personal Goal Increase confidence in doing things for herself and going out more.    Intervention Attend PR two days per week and begin a home exercise program.    Expected Outcomes Pt will meet stated goals.             Core Components/Risk Factors/Patient Goals Review:   Goals and Risk Factor Review     Row Name 10/25/22 0941             Core Components/Risk Factors/Patient Goals Review   Personal Goals Review Improve shortness of breath with ADL's;Develop more efficient breathing techniques such as purse lipped breathing and diaphragmatic breathing and practicing self-pacing with activity.;Other       Review Patient plans to start program soon.  Patient referred to PR with COPD via Dr. Ronalee Red.  Her personal goals for the program is to decrease SOB with exertion, go up stairs without SOB and gain confidence to go back shopping without SOB.  We will continue to monitor her progress as she works toward meeting these goals.       Expected Outcomes Patient will complete the program meeting both program and personal goals.                Core Components/Risk Factors/Patient Goals at Discharge (Final Review):   Goals and Risk Factor Review - 10/25/22 0941       Core Components/Risk Factors/Patient Goals Review   Personal Goals Review Improve shortness of breath with ADL's;Develop more efficient breathing techniques such as purse lipped breathing and diaphragmatic breathing and practicing self-pacing with activity.;Other    Review Patient plans to start program soon.  Patient referred to [facility] with COPD via Dr. Ronalee Red.  Her personal goals for the program is to decrease SOB with exertion, go up stairs without SOB and gain confidence to go back shopping without SOB.  We will continue to monitor her progress as she works toward meeting these goals.     Expected Outcomes Patient will complete the program meeting both program and personal goals.             ITP Comments:   Comments: Pt has cancelled her appointments and has not started the program yet.  We will monitor her progress as she works toward her goals.

## 2022-11-04 ENCOUNTER — Encounter (HOSPITAL_COMMUNITY): Payer: Medicare Other

## 2022-11-09 ENCOUNTER — Encounter (HOSPITAL_COMMUNITY): Payer: Medicare Other

## 2022-11-09 ENCOUNTER — Other Ambulatory Visit: Payer: Self-pay | Admitting: "Endocrinology

## 2022-11-11 ENCOUNTER — Encounter (HOSPITAL_COMMUNITY): Payer: Medicare Other

## 2022-11-16 ENCOUNTER — Encounter (HOSPITAL_COMMUNITY): Payer: Medicare Other

## 2022-11-18 ENCOUNTER — Encounter (HOSPITAL_COMMUNITY): Payer: Medicare Other

## 2022-11-19 ENCOUNTER — Other Ambulatory Visit (INDEPENDENT_AMBULATORY_CARE_PROVIDER_SITE_OTHER): Payer: Self-pay | Admitting: *Deleted

## 2022-11-19 ENCOUNTER — Telehealth (INDEPENDENT_AMBULATORY_CARE_PROVIDER_SITE_OTHER): Payer: Self-pay | Admitting: *Deleted

## 2022-11-19 ENCOUNTER — Telehealth (INDEPENDENT_AMBULATORY_CARE_PROVIDER_SITE_OTHER): Payer: Self-pay | Admitting: Gastroenterology

## 2022-11-19 DIAGNOSIS — R748 Abnormal levels of other serum enzymes: Secondary | ICD-10-CM

## 2022-11-19 NOTE — Telephone Encounter (Signed)
Discussed with patient's daughter and she wanted labs mailed to do at [facility]. I called [facility] and they will do orders. They use [facility]. [facility] put [facility] for [facility] and placed in mail.

## 2022-11-19 NOTE — Telephone Encounter (Signed)
Pt daughter called in to reschedule pt colonoscopy. Instructions mailed to patient. Pt already has prep.

## 2022-11-19 NOTE — Telephone Encounter (Signed)
Left message to return call with pt's daughter Arlina Robes -  Pt is due for repeat LFT. Need to find out which lab she wants to go to and put in order.

## 2022-11-19 NOTE — Addendum Note (Signed)
Encounter addended by: Tawni Millers, RN on: 11/19/2022 1:44 PM  Actions taken: Flowsheet data copied forward, Flowsheet accepted

## 2022-11-23 ENCOUNTER — Encounter (HOSPITAL_COMMUNITY)
Admission: RE | Admit: 2022-11-23 | Discharge: 2022-11-23 | Disposition: A | Discharge status: Home / Self Care | Payer: Medicare Other | Source: Ambulatory Visit | Attending: Critical Care Medicine | Admitting: Critical Care Medicine

## 2022-11-23 DIAGNOSIS — J449 Chronic obstructive pulmonary disease, unspecified: Secondary | ICD-10-CM

## 2022-11-23 LAB — GLUCOSE, CAPILLARY: Glucose-Capillary: 247 mg/dL — ABNORMAL HIGH (ref 70–99)

## 2022-11-23 NOTE — Progress Notes (Signed)
Daily Session Note  Patient Details  Name: Kimberly Graves MRN: [id number] Date of Birth: Oct 12, 1950 Referring Provider:   Flowsheet Row PULMONARY REHAB COPD ORIENTATION from 10/19/2022 in [facility] CARDIAC REHABILITATION  Referring Provider Dr. Maia Plan       Encounter Date: 11/23/2022  Check In:  Session Check In - 11/23/22 1500       Check-In   Physician(s) Dr. Diona Browner    Location [facility]    Staff Present Ross Ludwig, BS, Exercise Physiologist;Jeniah Kishi, RN;Daphyne Daphine Deutscher, RN, BSN    Virtual Visit No    Medication changes reported     No    Fall or balance concerns reported    Yes    Comments She has fallen several times over the past year.    Tobacco Cessation No Change    Warm-up and Cool-down Performed as group-led instruction    Resistance Training Performed Yes    VAD Patient? No    PAD/SET Patient? No      Pain Assessment   Currently in Pain? No/denies    Multiple Pain Sites No             Capillary Blood Glucose: Results for orders placed or performed during the hospital encounter of 11/23/22 (from the past 24 hour(s))  Glucose, capillary     Status: Abnormal   Collection Time: 11/23/22  2:50 PM  Result Value Ref Range   Glucose-Capillary 247 (H) 70 - 99 mg/dL      Social History   Tobacco Use  Smoking Status Former   Types: Cigarettes   Quit date: 2007   Years since quitting: 17.3   Passive exposure: Past  Smokeless Tobacco Not on file    Goals Met:  Proper associated with RPD/PD & O2 Sat Using PLB without cueing & demonstrates good technique Exercise tolerated well No report of concerns or symptoms today Strength training completed today  Goals Unmet:  Not Applicable  Comments: check out @ 4:00pm   Dr. Erick Blinks is Medical Director for [facility] Pulmonary Rehab.

## 2022-11-25 ENCOUNTER — Encounter (HOSPITAL_COMMUNITY)
Admission: RE | Admit: 2022-11-25 | Discharge: 2022-11-25 | Disposition: A | Discharge status: Home / Self Care | Payer: Medicare Other | Source: Ambulatory Visit | Attending: Critical Care Medicine | Admitting: Critical Care Medicine

## 2022-11-25 DIAGNOSIS — J449 Chronic obstructive pulmonary disease, unspecified: Secondary | ICD-10-CM

## 2022-11-25 LAB — GLUCOSE, CAPILLARY: Glucose-Capillary: 230 mg/dL — ABNORMAL HIGH (ref 70–99)

## 2022-11-25 NOTE — Progress Notes (Signed)
Daily Session Note  Patient Details  Name: Kimberly Graves MRN: [id number] Date of Birth: Dec 19, 1950 Referring Provider:   Flowsheet Row PULMONARY REHAB COPD ORIENTATION from 10/19/2022 in [facility] CARD[facility]AC REHABILITATION  Referring Provider Dr. Maia Plan       Encounter Date: 11/25/2022  Check In:  Session Check In - 11/25/22 1500       Check-In   Supervising physician immediately available to respond to emergencies [facility] MD immediately available    Physician(s) Dr. Diona Browner    Location [facility]    Staff Present Ross Ludwig, BS, Exercise Physiologist;Charrise Lardner BSN, RN;Debra Laural Benes, RN, BSN    Virtual Visit No    Medication changes reported     No    Fall or balance concerns reported    Yes    Comments She has fallen several times over the past year and she uses a walker.    Tobacco Cessation No Change    Warm-up and Cool-down Not performed (comment)   pt was late to class   Resistance Training Performed No    VAD Patient? No    PAD/SET Patient? No      Pain Assessment   Currently in Pain? Yes    Pain Score 4     Pain Location Shoulder    Pain Orientation Right    Pain Descriptors / Indicators Aching    Pain Type Chronic pain    Pain Onset More than a month ago    Pain Frequency Constant    Multiple Pain Sites Yes      2nd Pain Site   Pain Score 4    Pain Location Knee    Pain Orientation Right    Pain Descriptors / Indicators Aching    Pain Type Chronic pain    Pain Onset More than a month ago    Pain Frequency Constant             Capillary Blood Glucose: Results for orders placed or performed during the hospital encounter of 11/25/22 (from the past 24 hour(s))  Glucose, capillary     Status: Abnormal   Collection Time: 11/25/22  3:03 PM  Result Value Ref Range   Glucose-Capillary 230 (H) 70 - 99 mg/dL      Social History   Tobacco Use  Smoking Status Former   Types: Cigarettes   Quit date: 2007   Years since  quitting: 17.3   Passive exposure: Past  Smokeless Tobacco Not on file    Goals Met:  Proper associated with RPD/PD & O2 Sat Independence with exercise equipment Using PLB without cueing & demonstrates good technique Exercise tolerated well Queuing for purse lip breathing No report of concerns or symptoms today Strength training completed today  Goals Unmet:  Not Applicable  Comments: check out at 16:00   Dr. Erick Blinks is Medical Director for [facility] Pulmonary Rehab.

## 2022-11-30 ENCOUNTER — Encounter (HOSPITAL_COMMUNITY): Payer: Medicare Other

## 2022-12-01 NOTE — Telephone Encounter (Signed)
Pt daughter Joni Reining contacted and made aware of pre op appt.

## 2022-12-02 ENCOUNTER — Encounter (HOSPITAL_COMMUNITY)
Admission: RE | Admit: 2022-12-02 | Discharge: 2022-12-02 | Disposition: A | Discharge status: Home / Self Care | Payer: Medicare Other | Source: Ambulatory Visit | Attending: Critical Care Medicine | Admitting: Critical Care Medicine

## 2022-12-02 DIAGNOSIS — J449 Chronic obstructive pulmonary disease, unspecified: Secondary | ICD-10-CM

## 2022-12-02 LAB — GLUCOSE, CAPILLARY: Glucose-Capillary: 236 mg/dL — ABNORMAL HIGH (ref 70–99)

## 2022-12-02 NOTE — Progress Notes (Signed)
Daily Session Note  Patient Details  Name: Kimberly Graves MRN: [id number] Date of Birth: 1951/05/14 Referring Provider:   Flowsheet Row PULMONARY REHAB COPD ORIENTATION from 10/19/2022 in [facility] CARDIAC REHABILITATION  Referring Provider Dr. Maia Plan       Encounter Date: 12/02/2022  Check In:  Session Check In - 12/02/22 1454       Check-In   Supervising physician immediately available to respond to emergencies [facility] MD immediately available    Physician(s) Dr. Jenene Slicker    Location [facility]    Staff Present Ross Ludwig, BS, Exercise Physiologist;Other    Virtual Visit No    Medication changes reported     No    Fall or balance concerns reported    Yes    Comments She has fallen several times over the past year and she uses a walker.    Tobacco Cessation No Change    Warm-up and Cool-down Performed as group-led instruction    Resistance Training Performed Yes    VAD Patient? No    PAD/SET Patient? No      Pain Assessment   Currently in Pain? No/denies    Pain Score 0-No pain    Multiple Pain Sites No             Capillary Blood Glucose: Results for orders placed or performed during the hospital encounter of 12/02/22 (from the past 24 hour(s))  Glucose, capillary     Status: Abnormal   Collection Time: 12/02/22  2:46 PM  Result Value Ref Range   Glucose-Capillary 236 (H) 70 - 99 mg/dL      Social History   Tobacco Use  Smoking Status Former   Types: Cigarettes   Quit date: 2007   Years since quitting: 17.3   Passive exposure: Past  Smokeless Tobacco Not on file    Goals Met:  Independence with exercise equipment Exercise tolerated well Queuing for purse lip breathing No report of concerns or symptoms today Strength training completed today  Goals Unmet:  N/a  Comments: check out 1600   Dr. Erick Blinks is Medical Director for [facility] Pulmonary Rehab.

## 2022-12-02 NOTE — Progress Notes (Signed)
Pulmonary Individual Treatment Plan  Patient Details  Name: Kimberly Graves MRN: [id number] Date of Birth: [birth year] Referring Provider:   Flowsheet Row PULMONARY REHAB COPD ORIENTATION from 10/19/2022 in [facility] CARDIAC REH[facility]ILITATION  Referring Provider Dr. Maia Plan       Initial Encounter Date:  Flowsheet Row PULMONARY REHAB COPD ORIENTATION from 10/19/2022 in [facility]ARDIAC REHABILITATION  Date 10/19/22       Visit Diagnosis: Chronic obstructive pulmonary disease, unspecified COPD type  Patient's Home Medications on Admission:   Current Outpatient Medications:    acetaminophen (TYLENOL) 500 MG tablet, Take 2 tablets by mouth 2 (two) times daily., Disp: , Rfl:    albuterol (VENTOLIN HFA) 108 (90 Base) MCG/ACT inhaler, Inhale 1 puff into the lungs every 6 (six) hours as needed for shortness of breath., Disp: , Rfl:    aspirin 81 MG chewable tablet, Chew 81 mg by mouth daily., Disp: , Rfl:    atorvastatin (LIPITOR) 80 MG tablet, Take 1 tablet by mouth daily., Disp: , Rfl:    Blood Glucose Monitoring Suppl (ACCU-CHEK GUIDE ME) w/Device KIT, 1 Piece by Does not apply route as directed., Disp: 1 kit, Rfl: 0   busPIRone (BUSPAR) 10 MG tablet, Take 20 mg by mouth 2 (two) times daily., Disp: , Rfl:    carvedilol (COREG) 3.125 MG tablet, Take 3.125 mg by mouth 2 (two) times daily., Disp: , Rfl:    citalopram (CELEXA) 20 MG tablet, Take 20 mg by mouth daily., Disp: , Rfl:    fenofibrate (TRICOR) 145 MG tablet, Take 2 tablets by mouth daily., Disp: , Rfl:    glipiZIDE (GLUCOTROL XL) 5 MG 24 hr tablet, Take 1 tablet (5 mg total) by mouth daily with breakfast. (Patient not taking: Reported on 10/19/2022), Disp: 90 tablet, Rfl: 1   glucose blood (ACCU-CHEK GUIDE) test strip, Use as instructed, Disp: 100 each, Rfl: 2   ibuprofen (ADVIL) 200 MG tablet, Take 400 mg by mouth at bedtime., Disp: , Rfl:    Insulin Pen Needle (B-D ULTRAFINE III SHORT PEN) 31G X 8 MM MISC, 1 each by Does not  apply route as directed., Disp: 50 each, Rfl: 1   levETIRAcetam (KEPPRA) 250 MG tablet, Take 500 mg by mouth 2 (two) times daily., Disp: , Rfl:    lisinopril (ZESTRIL) 5 MG tablet, Take 1 tablet (5 mg total) by mouth daily., Disp: , Rfl:    Multiple Vitamin (MULTIVITAMIN) capsule, Take 1 capsule by mouth daily., Disp: , Rfl:    omeprazole (PRILOSEC) 20 MG capsule, Take 1 capsule by mouth daily., Disp: , Rfl:    pregabalin (LYRICA) 75 MG capsule, Take 75 mg by mouth 2 (two) times daily., Disp: , Rfl:    TRELEGY ELLIPTA 200-62.5-25 MCG/ACT AEPB, Inhale 1 puff into the lungs daily., Disp: , Rfl:    TRULICITY 3 MG/0.5ML SOPN, inject  SUBCUTANEOUSLY WEEKLY, Disp: 2 mL, Rfl: 2   VASCEPA 1 g capsule, TAKE ONE CAPSULE BY MOUTH TWICE DAILY WITH FOOD, Disp: 60 capsule, Rfl: 5  Past Medical History: Past Medical History:  Diagnosis Date   Diabetes mellitus, type II (HCC)    Rectal cancer (HCC)     Tobacco Use: Social History   Tobacco Use  Smoking Status Former   Types: Cigarettes   Quit date: 2007   Years since quitting: 17.3   Passive exposure: Past  Smokeless Tobacco Not on file    Labs: Review Flowsheet  More data exists      Latest Ref  Rng & Units 11/26/2020 10/24/2021 03/31/2022 04/27/2022 08/12/2022  Labs for ITP Cardiac and Pulmonary Rehab  Cholestrol 0 - 200 - - - 185     -  HDL-C 35 - 70 - - - 31     -  Trlycerides 40 - 160 - - - 627     -  Hemoglobin A1c 0.0 - 7.0 % 12.1  6.7  8.1  - 7.7     Details       This result is from an external source.         Capillary Blood Glucose: Lab Results  Component Value Date   GLUCAP 230 (H) 11/25/2022   GLUCAP 247 (H) 11/23/2022   GLUCAP 168 (H) 10/25/2021   GLUCAP 98 10/25/2021    POCT Glucose     Row Name 10/19/22 1340             POCT Blood Glucose   Pre-Exercise 149 mg/dL                Pulmonary Assessment Scores:  Pulmonary Assessment Scores     Row Name 10/19/22 1255         ADL UCSD   ADL Phase  Entry     SOB Score total 88     Rest 2     Walk 4     Stairs 5     Bath 4     Dress 3     Shop 3       CAT Score   CAT Score 33       mMRC Score   mMRC Score 4             UCSD: Self-administered rating of dyspnea associated with activities of daily living (ADLs) 6-point scale (0 = "not at all" to 5 = "maximal or unable to do because of breathlessness")  Scoring Scores range from 0 to 120.  Minimally important difference is 5 units  CAT: CAT can identify the health impairment of COPD patients and is better correlated with disease progression.  CAT has a scoring range of zero to 40. The CAT score is classified into four groups of low (less than 10), medium (10 - 20), high (21-30) and very high (31-40) based on the impact level of disease on health status. A CAT score over 10 suggests significant symptoms.  A worsening CAT score could be explained by an exacerbation, poor medication adherence, poor inhaler technique, or progression of COPD or comorbid conditions.  CAT MCID is 2 points  mMRC: mMRC (Modified Medical Research Council) Dyspnea Scale is used to assess the degree of baseline functional disability in patients of respiratory disease due to dyspnea. No minimal important difference is established. A decrease in score of 1 point or greater is considered a positive change.   Pulmonary Function Assessment:   Exercise Target Goals: Exercise Program Goal: Individual exercise prescription set using results from initial 6 min walk test and THRR while considering  patient's activity barriers and safety.   Exercise Prescription Goal: Initial exercise prescription builds to 30-45 minutes a day of aerobic activity, 2-3 days per week.  Home exercise guidelines will be given to patient during program as part of exercise prescription that the participant will acknowledge.  Activity Barriers & Risk Stratification:  Activity Barriers & Cardiac Risk Stratification - 10/19/22 1257        Activity Barriers & Cardiac Risk Stratification   Activity Barriers Arthritis;Back Problems;Joint Problems;Deconditioning;Shortness of Breath;Balance Concerns;History of  Falls;Assistive Device    Cardiac Risk Stratification High             6 Minute Walk:  6 Minute Walk     Row Name 10/19/22 1433         6 Minute Walk   Phase Initial     Distance 450 feet     Walk Time 6 minutes     # of Rest Breaks 1     MPH 0.85     RPE 11     Perceived Dyspnea  11     Symptoms Yes (comment)     Comments 1 seated rest break for 1 min 20 sec due to generalized fatigue. Reported right knee pain 4/10     Resting HR 81 bpm     Resting BP 116/72     Resting Oxygen Saturation  94 %     Exercise Oxygen Saturation  during 6 min walk 92 %     Max Ex. HR 94 bpm     Max Ex. BP 144/74     2 Minute Post BP 116/70       Interval HR   1 Minute HR 85     2 Minute HR 83     3 Minute HR 90     4 Minute HR 85     5 Minute HR 90     6 Minute HR 94     2 Minute Post HR 79     Interval Heart Rate? Yes       Interval Oxygen   Interval Oxygen? Yes     Baseline Oxygen Saturation % 94 %     1 Minute Oxygen Saturation % 95 %     1 Minute Liters of Oxygen 0 L     2 Minute Oxygen Saturation % 92 %     2 Minute Liters of Oxygen 0 L     3 Minute Oxygen Saturation % 92 %     3 Minute Liters of Oxygen 0 L     4 Minute Oxygen Saturation % 93 %     4 Minute Liters of Oxygen 0 L     5 Minute Oxygen Saturation % 93 %     5 Minute Liters of Oxygen 0 L     6 Minute Oxygen Saturation % 93 %     6 Minute Liters of Oxygen 0 L     2 Minute Post Oxygen Saturation % 95 %     2 Minute Post Liters of Oxygen 0 L              Oxygen Initial Assessment:  Oxygen Initial Assessment - 10/19/22 1432       Home Oxygen   Home Oxygen Device Home Concentrator    Sleep Oxygen Prescription Continuous    Liters per minute 3    Home Exercise Oxygen Prescription None    Home Resting Oxygen Prescription None     Compliance with Home Oxygen Use No      Initial 6 min Walk   Oxygen Used None      Program Oxygen Prescription   Program Oxygen Prescription None      Intervention   Short Term Goals To learn and understand importance of monitoring SPO2 with pulse oximeter and demonstrate accurate use of the pulse oximeter.;To learn and understand importance of maintaining oxygen saturations>88%;To learn and demonstrate proper pursed lip breathing techniques or other breathing techniques.  Long  Term Goals Verbalizes importance of monitoring SPO2 with pulse oximeter and return demonstration;Maintenance of O2 saturations>88%;Exhibits proper breathing techniques, such as pursed lip breathing or other method taught during program session;Compliance with respiratory medication             Oxygen Re-Evaluation:  Oxygen Re-Evaluation     Row Name 11/03/22 0730 12/01/22 1454           Program Oxygen Prescription   Program Oxygen Prescription None None        Home Oxygen   Home Oxygen Device Home Concentrator Home Concentrator      Sleep Oxygen Prescription Continuous Continuous      Liters per minute 3 3      Home Exercise Oxygen Prescription None None      Home Resting Oxygen Prescription None None      Compliance with Home Oxygen Use No No        Goals/Expected Outcomes   Short Term Goals To learn and understand importance of monitoring SPO2 with pulse oximeter and demonstrate accurate use of the pulse oximeter.;To learn and understand importance of maintaining oxygen saturations>88%;To learn and demonstrate proper pursed lip breathing techniques or other breathing techniques.  To learn and understand importance of monitoring SPO2 with pulse oximeter and demonstrate accurate use of the pulse oximeter.;To learn and demonstrate proper pursed lip breathing techniques or other breathing techniques. ;To learn and understand importance of maintaining oxygen saturations>88%      Long  Term Goals  Verbalizes importance of monitoring SPO2 with pulse oximeter and return demonstration;Maintenance of O2 saturations>88%;Exhibits proper breathing techniques, such as pursed lip breathing or other method taught during program session;Compliance with respiratory medication Verbalizes importance of monitoring SPO2 with pulse oximeter and return demonstration;Maintenance of O2 saturations>88%;Exhibits proper breathing techniques, such as pursed lip breathing or other method taught during program session;Compliance with respiratory medication      Goals/Expected Outcomes compliance compliance               Oxygen Discharge (Final Oxygen Re-Evaluation):  Oxygen Re-Evaluation - 12/01/22 1454       Program Oxygen Prescription   Program Oxygen Prescription None      Home Oxygen   Home Oxygen Device Home Concentrator    Sleep Oxygen Prescription Continuous    Liters per minute 3    Home Exercise Oxygen Prescription None    Home Resting Oxygen Prescription None    Compliance with Home Oxygen Use No      Goals/Expected Outcomes   Short Term Goals To learn and understand importance of monitoring SPO2 with pulse oximeter and demonstrate accurate use of the pulse oximeter.;To learn and demonstrate proper pursed lip breathing techniques or other breathing techniques. ;To learn and understand importance of maintaining oxygen saturations>88%    Long  Term Goals Verbalizes importance of monitoring SPO2 with pulse oximeter and return demonstration;Maintenance of O2 saturations>88%;Exhibits proper breathing techniques, such as pursed lip breathing or other method taught during program session;Compliance with respiratory medication    Goals/Expected Outcomes compliance             Initial Exercise Prescription:  Initial Exercise Prescription - 10/19/22 1400       Date of Initial Exercise RX and Referring Provider   Date 10/19/22    Referring Provider Dr. Maia Plan    Expected Discharge Date  02/24/23      NuStep   Level 1    SPM 80    Minutes 39  Prescription Details   Frequency (times per week) 2    Duration Progress to 30 minutes of continuous aerobic without signs/symptoms of physical distress      Intensity   THRR 40-80% of Max Heartrate 59-118    Ratings of Perceived Exertion 11-13    Perceived Dyspnea 0-4      Resistance Training   Training Prescription Yes    Weight 2    Reps 10-15             Perform Capillary Blood Glucose checks as needed.  Exercise Prescription Changes:   Exercise Prescription Changes     Row Name 11/25/22 1500             Response to Exercise   Blood Pressure (Admit) 104/60       Blood Pressure (Exercise) 100/62       Blood Pressure (Exit) 106/60       Heart Rate (Admit) 86 bpm       Heart Rate (Exercise) 83 bpm       Heart Rate (Exit) 81 bpm       Oxygen Saturation (Admit) 92 %       Oxygen Saturation (Exercise) 91 %       Oxygen Saturation (Exit) 90 %       Rating of Perceived Exertion (Exercise) 11       Perceived Dyspnea (Exercise) 11       Duration Continue with 30 min of aerobic exercise without signs/symptoms of physical distress.       Intensity THRR unchanged         Progression   Progression Continue to progress workloads to maintain intensity without signs/symptoms of physical distress.         Resistance Training   Training Prescription Yes       Weight 2       Reps 10-15       Time 10 Minutes         NuStep   Level 1       SPM 47       Minutes 39       METs 1.8                Exercise Comments:   Exercise Goals and Review:   Exercise Goals     Row Name 10/19/22 1443 11/03/22 0730 12/01/22 1453         Exercise Goals   Increase Physical Activity Yes Yes Yes     Intervention Provide advice, education, support and counseling about physical activity/exercise needs.;Develop an individualized exercise prescription for aerobic and resistive training based on initial evaluation  findings, risk stratification, comorbidities and participant's personal goals. Provide advice, education, support and counseling about physical activity/exercise needs.;Develop an individualized exercise prescription for aerobic and resistive training based on initial evaluation findings, risk stratification, comorbidities and participant's personal goals. Provide advice, education, support and counseling about physical activity/exercise needs.;Develop an individualized exercise prescription for aerobic and resistive training based on initial evaluation findings, risk stratification, comorbidities and participant's personal goals.     Expected Outcomes Short Term: Attend rehab on a regular basis to increase amount of physical activity.;Long Term: Add in home exercise to make exercise part of routine and to increase amount of physical activity.;Long Term: Exercising regularly at least 3-5 days a week. Short Term: Attend rehab on a regular basis to increase amount of physical activity.;Long Term: Add in home exercise to make exercise part of routine and to increase  amount of physical activity.;Long Term: Exercising regularly at least 3-5 days a week. Short Term: Attend rehab on a regular basis to increase amount of physical activity.;Long Term: Add in home exercise to make exercise part of routine and to increase amount of physical activity.;Long Term: Exercising regularly at least 3-5 days a week.     Increase Strength and Stamina Yes Yes Yes     Intervention Provide advice, education, support and counseling about physical activity/exercise needs.;Develop an individualized exercise prescription for aerobic and resistive training based on initial evaluation findings, risk stratification, comorbidities and participant's personal goals. Provide advice, education, support and counseling about physical activity/exercise needs.;Develop an individualized exercise prescription for aerobic and resistive training based on  initial evaluation findings, risk stratification, comorbidities and participant's personal goals. Provide advice, education, support and counseling about physical activity/exercise needs.;Develop an individualized exercise prescription for aerobic and resistive training based on initial evaluation findings, risk stratification, comorbidities and participant's personal goals.     Expected Outcomes Short Term: Increase workloads from initial exercise prescription for resistance, speed, and METs.;Short Term: Perform resistance training exercises routinely during rehab and add in resistance training at home;Long Term: Improve cardiorespiratory fitness, muscular endurance and strength as measured by increased METs and functional capacity ( ) Short Term: Increase workloads from initial exercise prescription for resistance, speed, and METs.;Short Term: Perform resistance training exercises routinely during rehab and add in resistance training at home;Long Term: Improve cardiorespiratory fitness, muscular endurance and strength as measured by increased METs and functional capacity ( ) Short Term: Increase workloads from initial exercise prescription for resistance, speed, and METs.;Short Term: Perform resistance training exercises routinely during rehab and add in resistance training at home;Long Term: Improve cardiorespiratory fitness, muscular endurance and strength as measured by increased METs and functional capacity ( )     Able to understand and use rate of perceived exertion (RPE) scale Yes Yes Yes     Intervention Provide education and explanation on how to use RPE scale Provide education and explanation on how to use RPE scale Provide education and explanation on how to use RPE scale     Expected Outcomes Short Term: Able to use RPE daily in rehab to express subjective intensity level;Long Term:  Able to use RPE to guide intensity level when exercising independently Short Term: Able to use RPE daily in  rehab to express subjective intensity level;Long Term:  Able to use RPE to guide intensity level when exercising independently Short Term: Able to use RPE daily in rehab to express subjective intensity level;Long Term:  Able to use RPE to guide intensity level when exercising independently     Able to understand and use Dyspnea scale Yes Yes Yes     Intervention Provide education and explanation on how to use Dyspnea scale Provide education and explanation on how to use Dyspnea scale Provide education and explanation on how to use Dyspnea scale     Expected Outcomes Short Term: Able to use Dyspnea scale daily in rehab to express subjective sense of shortness of breath during exertion;Long Term: Able to use Dyspnea scale to guide intensity level when exercising independently Short Term: Able to use Dyspnea scale daily in rehab to express subjective sense of shortness of breath during exertion;Long Term: Able to use Dyspnea scale to guide intensity level when exercising independently Short Term: Able to use Dyspnea scale daily in rehab to express subjective sense of shortness of breath during exertion;Long Term: Able to use Dyspnea scale to guide intensity level when exercising independently  Knowledge and understanding of Target Heart Rate Range (THRR) Yes Yes Yes     Intervention Provide education and explanation of THRR including how the numbers were predicted and where they are located for reference Provide education and explanation of THRR including how the numbers were predicted and where they are located for reference Provide education and explanation of THRR including how the numbers were predicted and where they are located for reference     Expected Outcomes Short Term: Able to state/look up THRR;Short Term: Able to use daily as guideline for intensity in rehab;Long Term: Able to use THRR to govern intensity when exercising independently Short Term: Able to state/look up THRR;Short Term: Able to use  daily as guideline for intensity in rehab;Long Term: Able to use THRR to govern intensity when exercising independently Short Term: Able to state/look up THRR;Short Term: Able to use daily as guideline for intensity in rehab;Long Term: Able to use THRR to govern intensity when exercising independently     Understanding of Exercise Prescription Yes Yes Yes     Intervention Provide education, explanation, and written materials on patient's individual exercise prescription Provide education, explanation, and written materials on patient's individual exercise prescription Provide education, explanation, and written materials on patient's individual exercise prescription     Expected Outcomes Long Term: Able to explain home exercise prescription to exercise independently;Short Term: Able to explain program exercise prescription Long Term: Able to explain home exercise prescription to exercise independently;Short Term: Able to explain program exercise prescription Long Term: Able to explain home exercise prescription to exercise independently;Short Term: Able to explain program exercise prescription              Exercise Goals Re-Evaluation :  Exercise Goals Re-Evaluation     Row Name 11/03/22 0731 12/01/22 1453           Exercise Goal Re-Evaluation   Exercise Goals Review Increase Physical Activity;Able to understand and use rate of perceived exertion (RPE) scale;Increase Strength and Stamina;Able to understand and use Dyspnea scale;Knowledge and understanding of Target Heart Rate Range (THRR);Understanding of Exercise Prescription Increase Physical Activity;Increase Strength and Stamina;Able to understand and use rate of perceived exertion (RPE) scale;Able to understand and use Dyspnea scale;Knowledge and understanding of Target Heart Rate Range (THRR);Understanding of Exercise Prescription      Comments Pt has not attended a PR session yet. Her daughter reports that she has pneumonia, so she has  missed her first two weeks. She is hopeful to begin 4/2. Unable to assess at this time due to lack of attendance. Pt has completed 2 sessions of PR. She is deconditioned and will progress slowly in  the program. She is currently exercising at 1.8 METs on  the stepper. Will continue to monitor and progress as able.,      Expected Outcomes Through exercise at rehab and at home, the patient will meet their stated goals. Through exercise at rehab and at home, the patient will meet their stated goals.               Discharge Exercise Prescription (Final Exercise Prescription Changes):  Exercise Prescription Changes - 11/25/22 1500       Response to Exercise   Blood Pressure (Admit) 104/60    Blood Pressure (Exercise) 100/62    Blood Pressure (Exit) 106/60    Heart Rate (Admit) 86 bpm    Heart Rate (Exercise) 83 bpm    Heart Rate (Exit) 81 bpm    Oxygen Saturation (Admit) 92 %  Oxygen Saturation (Exercise) 91 %    Oxygen Saturation (Exit) 90 %    Rating of Perceived Exertion (Exercise) 11    Perceived Dyspnea (Exercise) 11    Duration Continue with 30 min of aerobic exercise without signs/symptoms of physical distress.    Intensity THRR unchanged      Progression   Progression Continue to progress workloads to maintain intensity without signs/symptoms of physical distress.      Resistance Training   Training Prescription Yes    Weight 2    Reps 10-15    Time 10 Minutes      NuStep   Level 1    SPM 47    Minutes 39    METs 1.8             Nutrition:  Target Goals: Understanding of nutrition guidelines, daily intake of sodium 1500mg , cholesterol 200mg , calories 30% from fat and 7% or less from saturated fats, daily to have 5 or more servings of fruits and vegetables.  Biometrics:  Pre Biometrics - 10/19/22 1450       Pre Biometrics   Height 5' (1.524 m)    Weight 78 kg    Waist Circumference 44.5 inches    Hip Circumference 45.5 inches    Waist to Hip Ratio  0.98 %    BMI (Calculated) 33.58    Triceps Skinfold 43 mm    % Body Fat 49.1 %    Grip Strength 43 kg    Flexibility 0 in    Single Leg Stand 0 seconds              Nutrition Therapy Plan and Nutrition Goals:  Nutrition Therapy & Goals - 10/20/22 0737       Personal Nutrition Goals   Comments Patient scored 45 on diet assessment. We provide educational material on heart healthy nutrition with handouts and assistance with RD referral if patient is interested.      Intervention Plan   Intervention Nutrition handout(s) given to patient.             Nutrition Assessments:  Nutrition Assessments - 10/19/22 1315       MEDFICTS Scores   Pre Score 45            MEDIFICTS Score Key: ?70 Need to make dietary changes  40-70 Heart Healthy Diet ? 40 Therapeutic Level Cholesterol Diet   Picture Your Plate Scores: <60 Unhealthy dietary pattern with much room for improvement. 41-50 Dietary pattern unlikely to meet recommendations for good health and room for improvement. 51-60 More healthful dietary pattern, with some room for improvement.  >60 Healthy dietary pattern, although there may be some specific behaviors that could be improved.    Nutrition Goals Re-Evaluation:   Nutrition Goals Discharge (Final Nutrition Goals Re-Evaluation):   Psychosocial: Target Goals: Acknowledge presence or absence of significant depression and/or stress, maximize coping skills, provide positive support system. Participant is able to verbalize types and ability to use techniques and skills needed for reducing stress and depression.  Initial Review & Psychosocial Screening:  Initial Psych Review & Screening - 10/19/22 1301       Initial Review   Current issues with Current Depression;Current Sleep Concerns      Family Dynamics   Good Support System? Yes    Comments Her son, daugher, son in law, grandson, and sister support her.      Barriers   Psychosocial barriers to  participate in program There are no identifiable  barriers or psychosocial needs.      Screening Interventions   Interventions Encouraged to exercise;Provide feedback about the scores to participant    Expected Outcomes Long Term goal: The participant improves quality of Life and PHQ9 Scores as seen by post scores and/or verbalization of changes;Short Term goal: Identification and review with participant of any Quality of Life or Depression concerns found by scoring the questionnaire.             Quality of Life Scores:  Quality of Life - 10/19/22 1432       Quality of Life   Select Quality of Life      Quality of Life Scores   Health/Function Pre 13.13 %    Socioeconomic Pre 26.92 %    Psych/Spiritual Pre 26.57 %    Family Pre 30 %    GLOBAL Pre 20.77 %            Scores of 19 and below usually indicate a poorer quality of life in these areas.  A difference of  2-3 points is a clinically meaningful difference.  A difference of 2-3 points in the total score of the Quality of Life Index has been associated with significant improvement in overall quality of life, self-image, physical symptoms, and general health in studies assessing change in quality of life.   PHQ-9: Review Flowsheet       10/19/2022  Depression screen PHQ 2/9  Decreased Interest 1  Down, Depressed, Hopeless 1  PHQ - 2 Score 2  Altered sleeping 3  Tired, decreased energy 3  Change in appetite 1  Feeling bad or failure about yourself  1  Trouble concentrating 1  Moving slowly or fidgety/restless 0  Suicidal thoughts 0  PHQ-9 Score 11  Difficult doing work/chores Somewhat difficult   Interpretation of Total Score  Total Score Depression Severity:  1-4 = Minimal depression, 5-9 = Mild depression, 10-14 = Moderate depression, 15-19 = Moderately severe depression, 20-27 = Severe depression   Psychosocial Evaluation and Intervention:  Psychosocial Evaluation - 10/19/22 1454       Psychosocial  Evaluation & Interventions   Interventions Stress management education;Relaxation education;Encouraged to exercise with the program and follow exercise prescription    Comments Pt has no barriers to participate in PR. She has no identifiable psychosocial issues. She does have depression, but she states that it is well managed with buspirone 20 mg twice daily and citalopram 20 mg once daily. Her husband passed away back in 12/15/2021, but she reports that she has been able to cope well with this. She is with her daughter, Joni Reining, today. Joni Reining is her primary caregiver and takes her to all of her appointments. Pt also reports problems with her sleep, but she does not take anything for her sleep. She scored a 14 on her PHQ-9, and most of this relates to her depression and the effects of her COPD. She reports not being able to go out much due to her SOB. Outside of her daughter, she also lists her son, son in law, grandson, and sister as her support system. Her goals while in the program are to decrease her SOB with exertion and to gain her confidence back. She would like to be more confident that she can do activities such as shopping or walking up stairs without becoming SOB. She is eager to start the program.    Expected Outcomes Pt's depression will continue to be treated, and she will have no other identifiable  psychosocial issues.    Continue Psychosocial Services  No Follow up required             Psychosocial Re-Evaluation:  Psychosocial Re-Evaluation     Row Name 10/25/22 6045 11/19/22 1339           Psychosocial Re-Evaluation   Current issues with History of Depression;None Identified;Current Psychotropic Meds History of Depression;None Identified;Current Psychotropic Meds      Comments Patient has not started class yet, plans to this week.  Her initial PHQ-7 score is 14 and QOL score is 20.77%.  Patient was referred to PR with COPD via Dr. Renaldo Reel.  Her personal goals are to decrease her  SOB with exertion, go up stairs without SOB and gain confidence to go back shopping. Patient plans to start soon.  Her initial PHQ-7 score is 14 and QOL score is 20.77%.  Patient was referred to PR with COPD via Dr. Renaldo Reel.  Her personal goals are to decrease her SOB with exertion, go up stairs without SOB and gain confidence to go back shopping.      Expected Outcomes Patient will have no psychosocial issuses identified at discharge. Patient will have no psychosocial issuses identified at discharge.      Interventions Stress management education;Relaxation education;Encouraged to attend Pulmonary Rehabilitation for the exercise Stress management education;Relaxation education;Encouraged to attend Pulmonary Rehabilitation for the exercise      Continue Psychosocial Services  No Follow up required No Follow up required               Psychosocial Discharge (Final Psychosocial Re-Evaluation):  Psychosocial Re-Evaluation - 11/19/22 1339       Psychosocial Re-Evaluation   Current issues with History of Depression;None Identified;Current Psychotropic Meds    Comments Patient plans to start soon.  Her initial PHQ-7 score is 14 and QOL score is 20.77%.  Patient was referred to PR with COPD via Dr. Renaldo Reel.  Her personal goals are to decrease her SOB with exertion, go up stairs without SOB and gain confidence to go back shopping.    Expected Outcomes Patient will have no psychosocial issuses identified at discharge.    Interventions Stress management education;Relaxation education;Encouraged to attend Pulmonary Rehabilitation for the exercise    Continue Psychosocial Services  No Follow up required              Education: Education Goals: Education classes will be provided on a weekly basis, covering required topics. Participant will state understanding/return demonstration of topics presented.  Learning Barriers/Preferences:  Learning Barriers/Preferences - 10/19/22 1326       Learning  Barriers/Preferences   Learning Barriers None    Learning Preferences Written Material             Education Topics: How Lungs Work and Diseases: - Discuss the anatomy of the lungs and diseases that can affect the lungs, such as COPD.   Exercise: -Discuss the importance of exercise, FITT principles of exercise, normal and abnormal responses to exercise, and how to exercise safely.   Environmental Irritants: -Discuss types of environmental irritants and how to limit exposure to environmental irritants.   Meds/Inhalers and oxygen: - Discuss respiratory medications, definition of an inhaler and oxygen, and the proper way to use an inhaler and oxygen. Flowsheet Row PULMONARY REHAB OTHER RESPIRATORY from 11/25/2022 in [facility]  Date 11/25/22  Educator handout       Energy Saving Techniques: - Discuss methods to conserve energy and decrease shortness of breath when performing  activities of daily living.    Bronchial Hygiene / Breathing Techniques: - Discuss breathing mechanics, pursed-lip breathing technique,  proper posture, effective ways to clear airways, and other functional breathing techniques   Cleaning Equipment: - Provides group verbal and written instruction about the health risks of elevated stress, cause of high stress, and healthy ways to reduce stress.   Nutrition I: Fats: - Discuss the types of cholesterol, what cholesterol does to the body, and how cholesterol levels can be controlled.   Nutrition II: Labels: -Discuss the different components of food labels and how to read food labels.   Respiratory Infections: - Discuss the signs and symptoms of respiratory infections, ways to prevent respiratory infections, and the importance of seeking medical treatment when having a respiratory infection.   Stress I: Signs and Symptoms: - Discuss the causes of stress, how stress may lead to anxiety and depression, and ways to limit  stress.   Stress II: Relaxation: -Discuss relaxation techniques to limit stress.   Oxygen for Home/Travel: - Discuss how to prepare for travel when on oxygen and proper ways to transport and store oxygen to ensure safety.   Knowledge Questionnaire Score:  Knowledge Questionnaire Score - 10/19/22 1327       Knowledge Questionnaire Score   Pre Score 0/18, did not complete, but reviewed all correct answers with patient             Core Components/Risk Factors/Patient Goals at Admission:  Personal Goals and Risk Factors at Admission - 10/19/22 1336       Core Components/Risk Factors/Patient Goals on Admission   Improve shortness of breath with ADL's Yes    Intervention Provide education, individualized exercise plan and daily activity instruction to help decrease symptoms of SOB with activities of daily living.    Expected Outcomes Short Term: Improve cardiorespiratory fitness to achieve a reduction of symptoms when performing ADLs;Long Term: Be able to perform more ADLs without symptoms or delay the onset of symptoms    Increase knowledge of respiratory medications and ability to use respiratory devices properly  Yes    Intervention Provide education and demonstration as needed of appropriate use of medications, inhalers, and oxygen therapy.    Expected Outcomes Short Term: Achieves understanding of medications use. Understands that oxygen is a medication prescribed by physician. Demonstrates appropriate use of inhaler and oxygen therapy.;Long Term: Maintain appropriate use of medications, inhalers, and oxygen therapy.    Diabetes Yes    Intervention Provide education about signs/symptoms and action to take for hypo/hyperglycemia.;Provide education about proper nutrition, including hydration, and aerobic/resistive exercise prescription along with prescribed medications to achieve blood glucose in normal ranges: Fasting glucose 65-99 mg/dL    Expected Outcomes Short Term: Participant  verbalizes understanding of the signs/symptoms and immediate care of hyper/hypoglycemia, proper foot care and importance of medication, aerobic/resistive exercise and nutrition plan for blood glucose control.;Long Term: Attainment of HbA1C < 7%.    Personal Goal Other Yes    Personal Goal Increase confidence in doing things for herself and going out more.    Intervention Attend PR two days per week and begin a home exercise program.    Expected Outcomes Pt will meet stated goals.             Core Components/Risk Factors/Patient Goals Review:   Goals and Risk Factor Review     Row Name 10/25/22 0941 11/19/22 1342           Core Components/Risk Factors/Patient Goals Review  Personal Goals Review Improve shortness of breath with ADL's;Develop more efficient breathing techniques such as purse lipped breathing and diaphragmatic breathing and practicing self-pacing with activity.;Other Improve shortness of breath with ADL's;Develop more efficient breathing techniques such as purse lipped breathing and diaphragmatic breathing and practicing self-pacing with activity.;Other      Review Patient plans to start program soon.  Patient referred to PR with COPD via Dr. Renaldo Reel.  Her personal goals for the program is to decrease SOB with exertion, go up stairs without SOB and gain confidence to go back shopping without SOB.  We will continue to monitor her progress as she works toward meeting these goals. Patient plans to start program soon.  Patient referred to PR with COPD via Dr. Renaldo Reel.  Her personal goals for the program is to decrease SOB with exertion, go up stairs without SOB and gain confidence to go back shopping without SOB.  We will continue to monitor her progress as she works toward meeting these goals.      Expected Outcomes Patient will complete the program meeting both program and personal goals. Patient will complete the program meeting both program and personal goals.                Core Components/Risk Factors/Patient Goals at Discharge (Final Review):   Goals and Risk Factor Review - 11/19/22 1342       Core Components/Risk Factors/Patient Goals Review   Personal Goals Review Improve shortness of breath with ADL's;Develop more efficient breathing techniques such as purse lipped breathing and diaphragmatic breathing and practicing self-pacing with activity.;Other    Review Patient plans to start program soon.  Patient referred to PR with COPD via Dr. Renaldo Reel.  Her personal goals for the program is to decrease SOB with exertion, go up stairs without SOB and gain confidence to go back shopping without SOB.  We will continue to monitor her progress as she works toward meeting these goals.    Expected Outcomes Patient will complete the program meeting both program and personal goals.             ITP Comments:   Comments: ITP REVIEW Pt is making expected progress toward pulmonary rehab goals after completing 3 sessions. Recommend continued exercise, life style modification, education, and utilization of breathing techniques to increase stamina and strength and decrease shortness of breath with exertion.

## 2022-12-07 ENCOUNTER — Encounter (HOSPITAL_COMMUNITY): Payer: Medicare Other

## 2022-12-09 ENCOUNTER — Encounter (HOSPITAL_COMMUNITY): Payer: Medicare Other

## 2022-12-13 ENCOUNTER — Ambulatory Visit: Payer: Medicare Other | Admitting: "Endocrinology

## 2022-12-13 NOTE — Patient Instructions (Signed)
   Your procedure is scheduled on: 12/16/2022  Report to [facility] Main Entrance at 8:15    AM.  Call this number if you have problems the morning of surgery: [phone]   Remember:              Follow Directions on the letter you received from Your Physician's office regarding the Bowel Prep              No Smoking the day of Procedure :   Take these medicines the morning of surgery with A SIP OF WATER: Buspar, keppra, omeprazole, and lyrica  No diabetic medication am of procedure  Hold Trulicity injection for 7 days as instructed in letter   Do not wear jewelry, make-up or nail polish.    Do not bring valuables to the hospital.  Contacts, dentures or bridgework may not be worn into surgery.  .   Patients discharged the day of surgery will not be allowed to drive home.     Colonoscopy, Adult, Care After This sheet gives you information about how to care for yourself after your procedure. Your health care provider may also give you more specific instructions. If you have problems or questions, contact your health care provider. What can I expect after the procedure? After the procedure, it is common to have: A small amount of blood in your stool for 24 hours after the procedure. Some gas. Mild abdominal cramping or bloating.  Follow these instructions at home: General instructions  For the first 24 hours after the procedure: Do not drive or use machinery. Do not sign important documents. Do not drink alcohol. Do your regular daily activities at a slower pace than normal. Eat soft, easy-to-digest foods. Rest often. Take over-the-counter or prescription medicines only as told by your health care provider. It is up to you to get the results of your procedure. Ask your health care provider, or the department performing the procedure, when your results will be ready. Relieving cramping and bloating Try walking around when you have cramps or feel bloated. Apply heat to your  abdomen as told by your health care provider. Use a heat source that your health care provider recommends, such as a moist heat pack or a heating pad. Place a towel between your skin and the heat source. Leave the heat on for 20-30 minutes. Remove the heat if your skin turns bright red. This is especially important if you are unable to feel pain, heat, or cold. You may have a greater risk of getting burned. Eating and drinking Drink enough fluid to keep your urine clear or pale yellow. Resume your normal diet as instructed by your health care provider. Avoid heavy or fried foods that are hard to digest. Avoid drinking alcohol for as long as instructed by your health care provider. Contact a health care provider if: You have blood in your stool 2-3 days after the procedure. Get help right away if: You have more than a small spotting of blood in your stool. You pass large blood clots in your stool. Your abdomen is swollen. You have nausea or vomiting. You have a fever. You have increasing abdominal pain that is not relieved with medicine. This information is not intended to replace advice given to you by your health care provider. Make sure you discuss any questions you have with your health care provider. Document Released: 03/09/2004 Document Revised: 04/19/2016 Document Reviewed: 10/07/2015 Elsevier Interactive Patient Education  [employer].

## 2022-12-14 ENCOUNTER — Encounter (HOSPITAL_COMMUNITY)
Admission: RE | Admit: 2022-12-14 | Discharge: 2022-12-14 | Disposition: A | Discharge status: Home / Self Care | Payer: Medicare Other | Source: Ambulatory Visit | Attending: Gastroenterology | Admitting: Gastroenterology

## 2022-12-14 ENCOUNTER — Encounter (HOSPITAL_COMMUNITY): Payer: Medicare Other

## 2022-12-14 ENCOUNTER — Other Ambulatory Visit: Payer: Self-pay

## 2022-12-14 ENCOUNTER — Encounter (HOSPITAL_COMMUNITY): Payer: Self-pay

## 2022-12-14 VITALS — BP 126/59 | HR 83 | Temp 97.6°F | Resp 18 | Ht 60.0 in | Wt 172.0 lb

## 2022-12-14 DIAGNOSIS — I10 Essential (primary) hypertension: Secondary | ICD-10-CM | POA: Insufficient documentation

## 2022-12-14 DIAGNOSIS — Z01818 Encounter for other preprocedural examination: Secondary | ICD-10-CM | POA: Insufficient documentation

## 2022-12-14 DIAGNOSIS — E1165 Type 2 diabetes mellitus with hyperglycemia: Secondary | ICD-10-CM | POA: Insufficient documentation

## 2022-12-14 HISTORY — DX: Essential (primary) hypertension: I10

## 2022-12-14 HISTORY — DX: Myoneural disorder, unspecified: G70.9

## 2022-12-14 HISTORY — DX: Chronic obstructive pulmonary disease, unspecified: J44.9

## 2022-12-14 LAB — BASIC METABOLIC PANEL
Anion gap: 10 (ref 5–15)
BUN: 13 mg/dL (ref 8–23)
CO2: 21 mmol/L — ABNORMAL LOW (ref 22–32)
Calcium: 9.6 mg/dL (ref 8.9–10.3)
Chloride: 103 mmol/L (ref 98–111)
Creatinine, Ser: 0.72 mg/dL (ref 0.44–1.00)
GFR, Estimated: >60 mL/min (ref >60)
Glucose, Bld: 305 mg/dL — ABNORMAL HIGH (ref 70–99)
Potassium: 3.8 mmol/L (ref 3.5–5.1)
Sodium: 134 mmol/L — ABNORMAL LOW (ref 135–145)

## 2022-12-16 ENCOUNTER — Ambulatory Visit (HOSPITAL_COMMUNITY)
Admission: RE | Admit: 2022-12-16 | Discharge: 2022-12-16 | Disposition: A | Discharge status: Home / Self Care | Payer: Medicare Other | Attending: Gastroenterology | Admitting: Gastroenterology

## 2022-12-16 ENCOUNTER — Ambulatory Visit (HOSPITAL_BASED_OUTPATIENT_CLINIC_OR_DEPARTMENT_OTHER): Payer: Medicare Other | Admitting: Certified Registered Nurse Anesthetist

## 2022-12-16 ENCOUNTER — Ambulatory Visit (HOSPITAL_COMMUNITY): Payer: Medicare Other | Admitting: Certified Registered Nurse Anesthetist

## 2022-12-16 ENCOUNTER — Encounter (HOSPITAL_COMMUNITY): Payer: Self-pay | Admitting: Gastroenterology

## 2022-12-16 ENCOUNTER — Encounter (HOSPITAL_COMMUNITY): Payer: Medicare Other

## 2022-12-16 ENCOUNTER — Encounter (HOSPITAL_COMMUNITY)
Admission: RE | Disposition: A | Discharge status: Home / Self Care | Payer: Self-pay | Source: Home / Self Care | Attending: Gastroenterology

## 2022-12-16 ENCOUNTER — Encounter (INDEPENDENT_AMBULATORY_CARE_PROVIDER_SITE_OTHER): Payer: Self-pay | Admitting: *Deleted

## 2022-12-16 DIAGNOSIS — Z7984 Long term (current) use of oral hypoglycemic drugs: Secondary | ICD-10-CM | POA: Diagnosis not present

## 2022-12-16 DIAGNOSIS — K5730 Diverticulosis of large intestine without perforation or abscess without bleeding: Secondary | ICD-10-CM | POA: Diagnosis not present

## 2022-12-16 DIAGNOSIS — K635 Polyp of colon: Secondary | ICD-10-CM | POA: Diagnosis not present

## 2022-12-16 DIAGNOSIS — Z7985 Long-term (current) use of injectable non-insulin antidiabetic drugs: Secondary | ICD-10-CM | POA: Insufficient documentation

## 2022-12-16 DIAGNOSIS — Z90710 Acquired absence of both cervix and uterus: Secondary | ICD-10-CM | POA: Diagnosis not present

## 2022-12-16 DIAGNOSIS — Z86010 Personal history of colonic polyps: Secondary | ICD-10-CM | POA: Diagnosis not present

## 2022-12-16 DIAGNOSIS — Z980 Intestinal bypass and anastomosis status: Secondary | ICD-10-CM

## 2022-12-16 DIAGNOSIS — Z794 Long term (current) use of insulin: Secondary | ICD-10-CM | POA: Insufficient documentation

## 2022-12-16 DIAGNOSIS — Z1211 Encounter for screening for malignant neoplasm of colon: Secondary | ICD-10-CM | POA: Diagnosis present

## 2022-12-16 DIAGNOSIS — Z923 Personal history of irradiation: Secondary | ICD-10-CM | POA: Insufficient documentation

## 2022-12-16 DIAGNOSIS — Z8541 Personal history of malignant neoplasm of cervix uteri: Secondary | ICD-10-CM | POA: Insufficient documentation

## 2022-12-16 DIAGNOSIS — Z08 Encounter for follow-up examination after completed treatment for malignant neoplasm: Secondary | ICD-10-CM | POA: Diagnosis not present

## 2022-12-16 DIAGNOSIS — I5181 Takotsubo syndrome: Secondary | ICD-10-CM | POA: Insufficient documentation

## 2022-12-16 DIAGNOSIS — Z87891 Personal history of nicotine dependence: Secondary | ICD-10-CM | POA: Insufficient documentation

## 2022-12-16 DIAGNOSIS — I252 Old myocardial infarction: Secondary | ICD-10-CM | POA: Insufficient documentation

## 2022-12-16 DIAGNOSIS — Z85048 Personal history of other malignant neoplasm of rectum, rectosigmoid junction, and anus: Secondary | ICD-10-CM | POA: Diagnosis not present

## 2022-12-16 DIAGNOSIS — D120 Benign neoplasm of cecum: Secondary | ICD-10-CM | POA: Diagnosis not present

## 2022-12-16 DIAGNOSIS — J449 Chronic obstructive pulmonary disease, unspecified: Secondary | ICD-10-CM | POA: Diagnosis not present

## 2022-12-16 DIAGNOSIS — I10 Essential (primary) hypertension: Secondary | ICD-10-CM

## 2022-12-16 DIAGNOSIS — Z09 Encounter for follow-up examination after completed treatment for conditions other than malignant neoplasm: Secondary | ICD-10-CM | POA: Insufficient documentation

## 2022-12-16 DIAGNOSIS — Z9221 Personal history of antineoplastic chemotherapy: Secondary | ICD-10-CM | POA: Insufficient documentation

## 2022-12-16 DIAGNOSIS — Z9981 Dependence on supplemental oxygen: Secondary | ICD-10-CM | POA: Insufficient documentation

## 2022-12-16 HISTORY — PX: COLONOSCOPY WITH PROPOFOL: SHX5780

## 2022-12-16 HISTORY — PX: POLYPECTOMY: SHX5525

## 2022-12-16 HISTORY — PX: BIOPSY: SHX5522

## 2022-12-16 LAB — HM COLONOSCOPY

## 2022-12-16 LAB — GLUCOSE, CAPILLARY: Glucose-Capillary: 173 mg/dL — ABNORMAL HIGH (ref 70–99)

## 2022-12-16 SURGERY — COLONOSCOPY WITH PROPOFOL
Anesthesia: General

## 2022-12-16 MED ORDER — PROPOFOL 500 MG/50ML IV EMUL
INTRAVENOUS | Status: DC | PRN
  Administered 2022-12-16: 125 ug/kg/min via INTRAVENOUS

## 2022-12-16 MED ORDER — PROPOFOL 10 MG/ML IV BOLUS
INTRAVENOUS | Status: DC | PRN
  Administered 2022-12-16: 50 mg via INTRAVENOUS
  Administered 2022-12-16: 30 mg via INTRAVENOUS

## 2022-12-16 MED ORDER — LACTATED RINGERS IV SOLN: INTRAVENOUS | Status: DC | PRN

## 2022-12-16 NOTE — H&P (Signed)
Kimberly Graves is an 72 y.o. female.   Chief Complaint: history rectal cancer HPI: Kimberly Graves is a 72 y.o. female with Pmh rectal cancer s/p resection, neoadjuvant CRT, Takotsubo cardiomyopathy, diabetes c/b neuropathy, COPD on oxygen at home, small cell lung cancer s/p chemoradiation in 2007, cervical cancer s/p hysterectomy, who presents for evaluation for evaluation after treatment of rectal cancer.   The patient denies having any nausea, vomiting, fever, chills, hematochezia, melena, hematemesis, abdominal distention, abdominal pain, diarrhea, jaundice, pruritus or weight loss.  Past Medical History:  Diagnosis Date   COPD (chronic obstructive pulmonary disease) (HCC)    Diabetes mellitus, type II (HCC)    Hypertension    Myocardial infarction (HCC) 2020   Neuromuscular disorder (HCC)    Rectal cancer (HCC)    Small cell carcinoma (HCC) 2007    Past Surgical History:  Procedure Laterality Date   BREAST BIOPSY Right 08/04/2022   MM RT BREAST BX W LOC DEV 1ST LESION IMAGE BX SPEC STEREO GUIDE 08/04/2022 [facility]   TUMOR REMOVAL  2019    Family History  Problem Relation Age of Onset   Hypertension Mother    Cancer Mother    Hypertension Father    Social History:  reports that she quit smoking about 17 years ago. Her smoking use included cigarettes. She has been exposed to tobacco smoke. She does not have any smokeless tobacco history on file. She reports that she does not currently use alcohol. She reports that she does not use drugs.  Allergies:  Allergies  Allergen Reactions   Codeine Nausea And Vomiting   Lorcet [Hydrocodone-Acetaminophen] Nausea And Vomiting   Percocet [Oxycodone-Acetaminophen] Nausea And Vomiting    Medications Prior to Admission  Medication Sig Dispense Refill   acetaminophen (TYLENOL) 500 MG tablet Take 1,000 mg by mouth daily.     albuterol (PROVENTIL) (2.5 MG/3ML) 0.083% nebulizer solution Take 2.5 mg by nebulization every 6 (six)  hours as needed for shortness of breath or wheezing.     albuterol (VENTOLIN HFA) 108 (90 Base) MCG/ACT inhaler Inhale 2 puffs into the lungs every 6 (six) hours as needed for shortness of breath.     aspirin 81 MG chewable tablet Chew 81 mg by mouth at bedtime.     atorvastatin (LIPITOR) 80 MG tablet Take 1 tablet by mouth daily.     Blood Glucose Monitoring Suppl (ACCU-CHEK GUIDE ME) w/Device KIT 1 Piece by Does not apply route as directed. 1 kit 0   busPIRone (BUSPAR) 10 MG tablet Take 10 mg by mouth 2 (two) times daily.     carvedilol (COREG) 3.125 MG tablet Take 3.125 mg by mouth 2 (two) times daily with a meal.     cetirizine (ZYRTEC) 10 MG tablet Take 10 mg by mouth daily.     citalopram (CELEXA) 20 MG tablet Take 20 mg by mouth daily.     Cranberry 200 MG CAPS Take 200 mg by mouth at bedtime.     fenofibrate (TRICOR) 145 MG tablet Take 290 mg by mouth daily.     glucose blood (ACCU-CHEK GUIDE) test strip Use as instructed 100 each 2   ibuprofen (ADVIL) 200 MG tablet Take 400 mg by mouth at bedtime.     Insulin Pen Needle (B-D ULTRAFINE III SHORT PEN) 31G X 8 MM MISC 1 each by Does not apply route as directed. 50 each 1   levETIRAcetam (KEPPRA) 500 MG tablet Take 500 mg by mouth 2 (two) times daily.  lisinopril (ZESTRIL) 5 MG tablet Take 1 tablet (5 mg total) by mouth daily.     loperamide (IMODIUM A-D) 2 MG tablet Take 2 mg by mouth 4 (four) times daily as needed for diarrhea or loose stools.     Multiple Vitamin (MULTIVITAMIN) capsule Take 1 capsule by mouth daily.     omeprazole (PRILOSEC) 40 MG capsule Take 40 mg by mouth daily before breakfast.     pregabalin (LYRICA) 75 MG capsule Take 75 mg by mouth 2 (two) times daily.     TRELEGY ELLIPTA 200-62.5-25 MCG/ACT AEPB Inhale 1 puff into the lungs daily.     trolamine salicylate (ASPERCREME) 10 % cream Apply 1 Application topically as needed for muscle pain.     TRULICITY 3 MG/0.5ML SOPN inject 3mg  SUBCUTANEOUSLY WEEKLY 2 mL 2    glipiZIDE (GLUCOTROL XL) 5 MG 24 hr tablet Take 1 tablet (5 mg total) by mouth daily with breakfast. (Patient not taking: Reported on 10/19/2022) 90 tablet 1   VASCEPA 1 g capsule TAKE ONE CAPSULE BY MOUTH TWICE DAILY WITH FOOD 60 capsule 5    Results for orders placed or performed during the hospital encounter of 12/16/22 (from the past 48 hour(s))  Glucose, capillary     Status: Abnormal   Collection Time: 12/16/22  8:50 AM  Result Value Ref Range   Glucose-Capillary 173 (H) 70 - 99 mg/dL    Comment: Glucose reference range applies only to samples taken after fasting for at least 8 hours.   No results found.  Review of Systems  All other systems reviewed and are negative.   Blood pressure (!) 144/88, pulse 78, temperature 97.8 F (36.6 C), temperature source Oral, resp. rate 18, height 5' (1.524 m), weight 78 kg, SpO2 90 %. Physical Exam  GENERAL: The patient is AO x3, in no acute distress. HEENT: Head is normocephalic and atraumatic. EOMI are intact. Mouth is well hydrated and without lesions. NECK: Supple. No masses LUNGS: Clear to auscultation. No presence of rhonchi/wheezing/rales. Adequate chest expansion HEART: RRR, normal s1 and s2. ABDOMEN: Soft, nontender, no guarding, no peritoneal signs, and nondistended. BS +. No masses. EXTREMITIES: Without any cyanosis, clubbing, rash, lesions or edema. NEUROLOGIC: AOx3, no focal motor deficit. SKIN: no jaundice, no rashes  Assessment/Plan  Kimberly Graves is a 72 y.o. female with Pmh rectal cancer s/p resection, neoadjuvant CRT, Takotsubo cardiomyopathy, diabetes c/b neuropathy, COPD on oxygen at home, small cell lung cancer s/p chemoradiation in 2007, cervical cancer s/p hysterectomy, who presents for evaluation for evaluation after treatment of rectal cancer.  Will proceed with colonoscopy.  [name], MD 12/16/2022, 9:00 AM

## 2022-12-16 NOTE — Discharge Instructions (Addendum)
You are being discharged to home.  Resume your previous diet.  We are waiting for your pathology results.  Your physician has recommended a repeat colonoscopy in five years for surveillance.  

## 2022-12-16 NOTE — Anesthesia Preprocedure Evaluation (Signed)
Anesthesia Evaluation  Patient identified by MRN, date of birth, ID band Patient awake    Reviewed: Allergy & Precautions, H&P , NPO status , Patient's Chart, lab work & pertinent test results, reviewed documented beta blocker date and time   Airway Mallampati: II  TM Distance: >3 FB Neck ROM: full    Dental no notable dental hx.    Pulmonary neg pulmonary ROS, COPD, former smoker   Pulmonary exam normal breath sounds clear to auscultation       Cardiovascular Exercise Tolerance: Good hypertension, + Past MI  negative cardio ROS  Rhythm:regular Rate:Normal     Neuro/Psych  PSYCHIATRIC DISORDERS       Neuromuscular disease negative neurological ROS  negative psych ROS   GI/Hepatic negative GI ROS, Neg liver ROS,,,  Endo/Other  negative endocrine ROSdiabetes    Renal/GU Renal diseasenegative Renal ROS  negative genitourinary   Musculoskeletal   Abdominal   Peds  Hematology negative hematology ROS (+)   Anesthesia Other Findings   Reproductive/Obstetrics negative OB ROS                             Anesthesia Physical Anesthesia Plan  ASA: 3  Anesthesia Plan: General   Post-op Pain Management:    Induction:   PONV Risk Score and Plan: Propofol infusion  Airway Management Planned:   Additional Equipment:   Intra-op Plan:   Post-operative Plan:   Informed Consent: I have reviewed the patients History and Physical, chart, labs and discussed the procedure including the risks, benefits and alternatives for the proposed anesthesia with the patient or authorized representative who has indicated his/her understanding and acceptance.     Dental Advisory Given  Plan Discussed with: CRNA  Anesthesia Plan Comments:        Anesthesia Quick Evaluation

## 2022-12-16 NOTE — Transfer of Care (Signed)
Immediate Anesthesia Transfer of Care Note  Patient: Kimberly Graves  Procedure(s) Performed: COLONOSCOPY WITH PROPOFOL POLYPECTOMY BIOPSY  Patient Location: Short Stay  Anesthesia Type:General  Level of Consciousness: awake  Airway & Oxygen Therapy: Patient Spontanous Breathing  Post-op Assessment: Report given to RN and Post -op Vital signs reviewed and stable  Post vital signs: Reviewed and stable  Last Vitals:  Vitals Value Taken Time  BP 141/88 12/16/22 1050  Temp 36.4 C 12/16/22 1050  Pulse 75 12/16/22 1050  Resp 19 12/16/22 1050  SpO2 93 % 12/16/22 1050    Last Pain:  Vitals:   12/16/22 1050  TempSrc: Oral  PainSc: 0-No pain         Complications: No notable events documented.

## 2022-12-16 NOTE — Op Note (Signed)
[facility] Patient Name: Kimberly Graves Procedure Date: 12/16/2022 10:01 AM MRN: [id number] Date of Birth: 02-19-1951 Attending MD: Katrinka Blazing , , [id number] CSN: [id number] Age: 72 Admit Type: Outpatient Procedure:                Colonoscopy Indications:              High risk colon cancer surveillance: Personal                            history of colon cancer Providers:                Katrinka Blazing, Edrick Kins, RN, Dyann Ruddle Referring MD:              Medicines:                Monitored Anesthesia Care Complications:            No immediate complications. Estimated Blood Loss:     Estimated blood loss: none. Procedure:                Pre-Anesthesia Assessment:                           - Prior to the procedure, a History and Physical                            was performed, and patient medications, allergies                            and sensitivities were reviewed. The patient's                            tolerance of previous anesthesia was reviewed.                           - The risks and benefits of the procedure and the                            sedation options and risks were discussed with the                            patient. All questions were answered and informed                            consent was obtained.                           After obtaining informed consent, the colonoscope                            was passed under direct vision. Throughout the                            procedure, the patient's blood pressure, pulse, and                            oxygen saturations were  monitored continuously. The                            [id number]-HQ190L ([id number]) scope was introduced through                            the anus and advanced to the the cecum, identified                            by appendiceal orifice and ileocecal valve. The                            colonoscopy was performed without difficulty. The                             patient tolerated the procedure well. The quality                            of the bowel preparation was adequate. Scope In: 10:23:20 AM Scope Out: 10:46:50 AM Scope Withdrawal Time: 0 hours 20 minutes 24 seconds  Total Procedure Duration: 0 hours 23 minutes 30 seconds  Findings:      The perianal and digital rectal examinations were normal.      Two sessile polyps were found in the cecum. The polyps were 2 to 4 mm in       size. These polyps were removed with a cold snare. Resection and       retrieval were complete.      Two sessile polyps were found in the cecum. The polyps were 1 mm in       size. These polyps were removed with a cold biopsy forceps. Resection       and retrieval were complete.      Scattered large-mouthed and small-mouthed diverticula were found in the       entire colon.      There was evidence of a prior end-to-end colo-rectal anastomosis in the       rectum. This was patent and was characterized by healthy appearing       mucosa. The anastomosis was traversed. No evidence of residual mass was       observed.      Retroflexion could not be performed due to looping - forward view did       not show any abnormalities. Impression:               - Two 2 to 4 mm polyps in the cecum, removed with a                            cold snare. Resected and retrieved.                           - Two 1 mm polyps in the cecum, removed with a cold                            biopsy forceps. Resected and retrieved.                           -  Diverticulosis in the entire examined colon.                           - Patent end-to-end colo-rectal anastomosis,                            characterized by healthy appearing mucosa. Moderate Sedation:      Per Anesthesia Care Recommendation:           - Discharge patient to home (ambulatory).                           - Resume previous diet.                           - Await pathology results.                           - Repeat  colonoscopy in 5 years for surveillance. Procedure Code(s):        --- Professional ---                           [phone], Colonoscopy, flexible; with removal of                            tumor(s), polyp(s), or other lesion(s) by snare                            technique                           45380, 59, Colonoscopy, flexible; with biopsy,                            single or multiple Diagnosis Code(s):        --- Professional ---                           U04.540, Personal history of other malignant                            neoplasm of large intestine                           D12.0, Benign neoplasm of cecum                           Z98.0, Intestinal bypass and anastomosis status                           K57.30, Diverticulosis of large intestine without                            perforation or abscess without bleeding CPT copyright 2022 [employer]. All rights reserved. The codes documented in this report are preliminary and upon coder review may  be revised to meet current compliance requirements. Katrinka Blazing, MD Katrinka Blazing,  12/16/2022 10:58:15 AM  This report has been signed electronically. Number of Addenda: 0

## 2022-12-18 NOTE — Anesthesia Postprocedure Evaluation (Signed)
Anesthesia Post Note  Patient: Kimberly Graves  Procedure(s) Performed: COLONOSCOPY WITH PROPOFOL POLYPECTOMY BIOPSY  Patient location during evaluation: Phase II Anesthesia Type: General Level of consciousness: awake Pain management: pain level controlled Vital Signs Assessment: post-procedure vital signs reviewed and stable Respiratory status: spontaneous breathing and respiratory function stable Cardiovascular status: blood pressure returned to baseline and stable Postop Assessment: no headache and no apparent nausea or vomiting Anesthetic complications: no Comments: Late entry   No notable events documented.   Last Vitals:  Vitals:   12/16/22 0832 12/16/22 1050  BP: (!) 144/88 (!) 141/88  Pulse: 78 75  Resp: 18 19  Temp: 36.6 C 36.4 C  SpO2: 90% 93%    Last Pain:  Vitals:   12/16/22 1050  TempSrc: Oral  PainSc: 0-No pain                 Windell Norfolk

## 2022-12-20 LAB — SURGICAL PATHOLOGY

## 2022-12-20 NOTE — Addendum Note (Signed)
Encounter addended by: Tawni Millers, RN on: 12/20/2022 2:50 PM  Actions taken: Flowsheet data copied forward, Flowsheet accepted

## 2022-12-21 ENCOUNTER — Encounter (HOSPITAL_COMMUNITY): Payer: Medicare Other

## 2022-12-21 DIAGNOSIS — J449 Chronic obstructive pulmonary disease, unspecified: Secondary | ICD-10-CM | POA: Insufficient documentation

## 2022-12-23 ENCOUNTER — Encounter (HOSPITAL_COMMUNITY): Payer: Self-pay | Admitting: Gastroenterology

## 2022-12-23 ENCOUNTER — Encounter (HOSPITAL_COMMUNITY)
Admission: RE | Admit: 2022-12-23 | Discharge: 2022-12-23 | Disposition: A | Discharge status: Home / Self Care | Payer: Medicare Other | Source: Ambulatory Visit | Attending: Critical Care Medicine | Admitting: Critical Care Medicine

## 2022-12-23 DIAGNOSIS — J449 Chronic obstructive pulmonary disease, unspecified: Secondary | ICD-10-CM | POA: Diagnosis present

## 2022-12-23 NOTE — Progress Notes (Signed)
Daily Session Note  Patient Details  Name: Kimberly Graves MRN: [id number] Date of Birth: 08-Oct-1950 Referring Provider:   Flowsheet Row PULMONARY REHAB COPD ORIENTATION from 10/19/2022 in [facility] CARDIAC REHABILITATION  Referring Provider Dr. Maia Plan       Encounter Date: 12/23/2022  Check In:  Session Check In - 12/23/22 1500       Check-In   Supervising physician immediately available to respond to emergencies [facility] MD immediately available    Physician(s) Dr. Jenene Slicker    Location [facility]    Staff Present Ross Ludwig, BS, Exercise Physiologist;Jenkins Risdon Daphine Deutscher, RN, BSN    Virtual Visit No    Medication changes reported     No    Fall or balance concerns reported    Yes    Comments She has fallen several times over the past year and she uses a walker.    Tobacco Cessation No Change    Warm-up and Cool-down Performed as group-led instruction    Resistance Training Performed Yes      Pain Assessment   Currently in Pain? No/denies    Pain Score 0-No pain             Capillary Blood Glucose: No results found for this or any previous visit (from the past 24 hour(s)).    Social History   Tobacco Use  Smoking Status Former   Types: Cigarettes   Quit date: 2007   Years since quitting: 17.3   Passive exposure: Past  Smokeless Tobacco Not on file    Goals Met:  Proper associated with RPD/PD & O2 Sat Independence with exercise equipment Using PLB without cueing & demonstrates good technique Exercise tolerated well Queuing for purse lip breathing No report of concerns or symptoms today Strength training completed today  Goals Unmet:  Not Applicable  Comments: Checkout at 1600.   Dr. Erick Blinks is Medical Director for [facility] Pul[facility].

## 2022-12-28 ENCOUNTER — Encounter (HOSPITAL_COMMUNITY)
Admission: RE | Admit: 2022-12-28 | Discharge: 2022-12-28 | Disposition: A | Discharge status: Home / Self Care | Payer: Medicare Other | Source: Ambulatory Visit | Attending: Critical Care Medicine | Admitting: Critical Care Medicine

## 2022-12-28 DIAGNOSIS — J449 Chronic obstructive pulmonary disease, unspecified: Secondary | ICD-10-CM

## 2022-12-28 NOTE — Progress Notes (Signed)
Daily Session Note  Patient Details  Name: Kimberly Graves MRN: [id number] Date of Birth: 07-21-1951 Referring Provider:   Flowsheet Row PULMONARY REHAB COPD ORIENTATION from 10/19/2022 in [facility] CARDIAC REHABILITATION  Referring Provider Dr. Maia Plan       Encounter Date: 12/28/2022  Check In:  Session Check In - 12/28/22 1500       Check-In   Supervising physician immediately available to respond to emergencies [facility] MD immediately available    Physician(s) Dr Tenny Craw    Location [facility]    Staff Present Ross Ludwig, BS, Exercise Physiologist;Shamona Wirtz Daphine Deutscher, RN, BSN    Virtual Visit No    Medication changes reported     No    Fall or balance concerns reported    Yes    Comments She has fallen several times over the past year and she uses a walker.    Tobacco Cessation No Change    Warm-up and Cool-down Performed as group-led instruction    Resistance Training Performed Yes      Pain Assessment   Currently in Pain? No/denies    Pain Score 0-No pain             Capillary Blood Glucose: No results found for this or any previous visit (from the past 24 hour(s)).    Social History   Tobacco Use  Smoking Status Former   Types: Cigarettes   Quit date: 2007   Years since quitting: 17.3   Passive exposure: Past  Smokeless Tobacco Not on file    Goals Met:  Proper associated with RPD/PD & O2 Sat Independence with exercise equipment Using PLB without cueing & demonstrates good technique Exercise tolerated well Queuing for purse lip breathing No report of concerns or symptoms today Strength training completed today  Goals Unmet:  Not Applicable  Comments: Checkout at 1600.   Dr. Erick Blinks is Medical Director for [facility].

## 2022-12-29 NOTE — Progress Notes (Signed)
Pulmonary Individual Treatment Plan  Patient Details  Name: Kimberly Graves MRN: [id number] Date of Birth: 03/15/1951 Referring Provider:   Flowsheet Row PULMONARY REHAB COPD ORIENTATION from 10/19/2022 in [facility] CARDIAC REHABILITATION  Referring Provider Dr. Maia Plan       Initial Encounter Date:  Flowsheet Row PULMONARY REHAB COPD ORIENTATION from 10/19/2022 in [facility] CARDIAC REHABILITATION  Date 10/19/22       Visit Diagnosis: Chronic obstructive pulmonary disease, unspecified COPD type (HCC)  Patient's Home Medications on Admission:   Current Outpatient Medications:    acetaminophen (TYLENOL) 500 MG tablet, Take 1,000 mg by mouth daily., Disp: , Rfl:    albuterol (PROVENTIL) (2.5 MG/3ML) 0.083% nebulizer solution, Take 2.5 mg by nebulization every 6 (six) hours as needed for shortness of breath or wheezing., Disp: , Rfl:    albuterol (VENTOLIN HFA) 108 (90 Base) MCG/ACT inhaler, Inhale 2 puffs into the lungs every 6 (six) hours as needed for shortness of breath., Disp: , Rfl:    aspirin 81 MG chewable tablet, Chew 81 mg by mouth at bedtime., Disp: , Rfl:    atorvastatin (LIPITOR) 80 MG tablet, Take 1 tablet by mouth daily., Disp: , Rfl:    Blood Glucose Monitoring Suppl (ACCU-CHEK GUIDE ME) w/Device KIT, 1 Piece by Does not apply route as directed., Disp: 1 kit, Rfl: 0   busPIRone (BUSPAR) 10 MG tablet, Take 10 mg by mouth 2 (two) times daily., Disp: , Rfl:    carvedilol (COREG) 3.125 MG tablet, Take 3.125 mg by mouth 2 (two) times daily with a meal., Disp: , Rfl:    cetirizine (ZYRTEC) 10 MG tablet, Take 10 mg by mouth daily., Disp: , Rfl:    citalopram (CELEXA) 20 MG tablet, Take 20 mg by mouth daily., Disp: , Rfl:    Cranberry 200 MG CAPS, Take 200 mg by mouth at bedtime., Disp: , Rfl:    fenofibrate (TRICOR) 145 MG tablet, Take 290 mg by mouth daily., Disp: , Rfl:    glipiZIDE (GLUCOTROL XL) 5 MG 24 hr tablet, Take 1 tablet (5 mg total) by mouth daily with  breakfast. (Patient not taking: Reported on 10/19/2022), Disp: 90 tablet, Rfl: 1   glucose blood (ACCU-CHEK GUIDE) test strip, Use as instructed, Disp: 100 each, Rfl: 2   ibuprofen (ADVIL) 200 MG tablet, Take 400 mg by mouth at bedtime., Disp: , Rfl:    Insulin Pen Needle (B-D ULTRAFINE III SHORT PEN) 31G X 8 MM MISC, 1 each by Does not apply route as directed., Disp: 50 each, Rfl: 1   levETIRAcetam (KEPPRA) 500 MG tablet, Take 500 mg by mouth 2 (two) times daily., Disp: , Rfl:    lisinopril (ZESTRIL) 5 MG tablet, Take 1 tablet (5 mg total) by mouth daily., Disp: , Rfl:    loperamide (IMODIUM A-D) 2 MG tablet, Take 2 mg by mouth 4 (four) times daily as needed for diarrhea or loose stools., Disp: , Rfl:    Multiple Vitamin (MULTIVITAMIN) capsule, Take 1 capsule by mouth daily., Disp: , Rfl:    omeprazole (PRILOSEC) 40 MG capsule, Take 40 mg by mouth daily before breakfast., Disp: , Rfl:    pregabalin (LYRICA) 75 MG capsule, Take 75 mg by mouth 2 (two) times daily., Disp: , Rfl:    TRELEGY ELLIPTA 200-62.5-25 MCG/ACT AEPB, Inhale 1 puff into the lungs daily., Disp: , Rfl:    trolamine salicylate (ASPERCREME) 10 % cream, Apply 1 Application topically as needed for muscle pain., Disp: , Rfl:  TRULICITY 3 MG/0.5ML SOPN, inject 3mg  SUBCUTANEOUSLY WEEKLY, Disp: 2 mL, Rfl: 2   VASCEPA 1 g capsule, TAKE ONE CAPSULE BY MOUTH TWICE DAILY WITH FOOD, Disp: 60 capsule, Rfl: 5  Past Medical History: Past Medical History:  Diagnosis Date   COPD (chronic obstructive pulmonary disease) (HCC)    Diabetes mellitus, type II (HCC)    Hypertension    Myocardial infarction (HCC) 2020   Neuromuscular disorder (HCC)    Rectal cancer (HCC)    Small cell carcinoma (HCC) 2007    Tobacco Use: Social History   Tobacco Use  Smoking Status Former   Types: Cigarettes   Quit date: 2007   Years since quitting: 17.4   Passive exposure: Past  Smokeless Tobacco Not on file    Labs: Review Flowsheet  More data  exists      Latest Ref Rng & Units 11/26/2020 10/24/2021 03/31/2022 04/27/2022 08/12/2022  Labs for ITP Cardiac and Pulmonary Rehab  Cholestrol 0 - 200 - - - 185     -  HDL-C 35 - 70 - - - 31     -  Trlycerides 40 - 160 - - - 627     -  Hemoglobin A1c 0.0 - 7.0 % 12.1  6.7  8.1  - 7.7     Details       This result is from an external source.         Capillary Blood Glucose: Lab Results  Component Value Date   GLUCAP 173 (H) 12/16/2022   GLUCAP 236 (H) 12/02/2022   GLUCAP 230 (H) 11/25/2022   GLUCAP 247 (H) 11/23/2022   GLUCAP 168 (H) 10/25/2021    POCT Glucose     Row Name 10/19/22 1340             POCT Blood Glucose   Pre-Exercise 149 mg/dL                Pulmonary Assessment Scores:  Pulmonary Assessment Scores     Row Name 10/19/22 1255         ADL UCSD   ADL Phase Entry     SOB Score total 88     Rest 2     Walk 4     Stairs 5     Bath 4     Dress 3     Shop 3       CAT Score   CAT Score 33       mMRC Score   mMRC Score 4             UCSD: Self-administered rating of dyspnea associated with activities of daily living (ADLs) 6-point scale (0 = "not at all" to 5 = "maximal or unable to do because of breathlessness")  Scoring Scores range from 0 to 120.  Minimally important difference is 5 units  CAT: CAT can identify the health impairment of COPD patients and is better correlated with disease progression.  CAT has a scoring range of zero to 40. The CAT score is classified into four groups of low (less than 10), medium (10 - 20), high (21-30) and very high (31-40) based on the impact level of disease on health status. A CAT score over 10 suggests significant symptoms.  A worsening CAT score could be explained by an exacerbation, poor medication adherence, poor inhaler technique, or progression of COPD or comorbid conditions.  CAT MCID is 2 points  mMRC: mMRC (Modified Medical Research Council) Dyspnea Scale is  used to assess the degree of  baseline functional disability in patients of respiratory disease due to dyspnea. No minimal important difference is established. A decrease in score of 1 point or greater is considered a positive change.   Pulmonary Function Assessment:   Exercise Target Goals: Exercise Program Goal: Individual exercise prescription set using results from initial 6 min walk test and THRR while considering  patient's activity barriers and safety.   Exercise Prescription Goal: Initial exercise prescription builds to 30-45 minutes a day of aerobic activity, 2-3 days per week.  Home exercise guidelines will be given to patient during program as part of exercise prescription that the participant will acknowledge.  Activity Barriers & Risk Stratification:  Activity Barriers & Cardiac Risk Stratification - 10/19/22 1257       Activity Barriers & Cardiac Risk Stratification   Activity Barriers Arthritis;Back Problems;Joint Problems;Deconditioning;Shortness of Breath;Balance Concerns;History of Falls;Assistive Device    Cardiac Risk Stratification High             6 Minute Walk:  6 Minute Walk     Row Name 10/19/22 1433         6 Minute Walk   Phase Initial     Distance 450 feet     Walk Time 6 minutes     # of Rest Breaks 1     MPH 0.85     RPE 11     Perceived Dyspnea  11     Symptoms Yes (comment)     Comments 1 seated rest break for 1 min 20 sec due to generalized fatigue. Reported right knee pain 4/10     Resting HR 81 bpm     Resting BP 116/72     Resting Oxygen Saturation  94 %     Exercise Oxygen Saturation  during 6 min walk 92 %     Max Ex. HR 94 bpm     Max Ex. BP 144/74     2 Minute Post BP 116/70       Interval HR   1 Minute HR 85     2 Minute HR 83     3 Minute HR 90     4 Minute HR 85     5 Minute HR 90     6 Minute HR 94     2 Minute Post HR 79     Interval Heart Rate? Yes       Interval Oxygen   Interval Oxygen? Yes     Baseline Oxygen Saturation % 94 %     1  Minute Oxygen Saturation % 95 %     1 Minute Liters of Oxygen 0 L     2 Minute Oxygen Saturation % 92 %     2 Minute Liters of Oxygen 0 L     3 Minute Oxygen Saturation % 92 %     3 Minute Liters of Oxygen 0 L     4 Minute Oxygen Saturation % 93 %     4 Minute Liters of Oxygen 0 L     5 Minute Oxygen Saturation % 93 %     5 Minute Liters of Oxygen 0 L     6 Minute Oxygen Saturation % 93 %     6 Minute Liters of Oxygen 0 L     2 Minute Post Oxygen Saturation % 95 %     2 Minute Post Liters of Oxygen 0 L  Oxygen Initial Assessment:  Oxygen Initial Assessment - 10/19/22 1432       Home Oxygen   Home Oxygen Device Home Concentrator    Sleep Oxygen Prescription Continuous    Liters per minute 3    Home Exercise Oxygen Prescription None    Home Resting Oxygen Prescription None    Compliance with Home Oxygen Use No      Initial 6 min Walk   Oxygen Used None      Program Oxygen Prescription   Program Oxygen Prescription None      Intervention   Short Term Goals To learn and understand importance of monitoring SPO2 with pulse oximeter and demonstrate accurate use of the pulse oximeter.;To learn and understand importance of maintaining oxygen saturations>88%;To learn and demonstrate proper pursed lip breathing techniques or other breathing techniques.     Long  Term Goals Verbalizes importance of monitoring SPO2 with pulse oximeter and return demonstration;Maintenance of O2 saturations>88%;Exhibits proper breathing techniques, such as pursed lip breathing or other method taught during program session;Compliance with respiratory medication             Oxygen Re-Evaluation:  Oxygen Re-Evaluation     Row Name 11/03/22 0730 12/01/22 1454 12/29/22 1445         Program Oxygen Prescription   Program Oxygen Prescription None None None       Home Oxygen   Home Oxygen Device Home Concentrator Home Concentrator Home Concentrator     Sleep Oxygen Prescription  Continuous Continuous Continuous     Liters per minute 3 3 3      Home Exercise Oxygen Prescription None None None     Home Resting Oxygen Prescription None None None     Compliance with Home Oxygen Use No No No       Goals/Expected Outcomes   Short Term Goals To learn and understand importance of monitoring SPO2 with pulse oximeter and demonstrate accurate use of the pulse oximeter.;To learn and understand importance of maintaining oxygen saturations>88%;To learn and demonstrate proper pursed lip breathing techniques or other breathing techniques.  To learn and understand importance of monitoring SPO2 with pulse oximeter and demonstrate accurate use of the pulse oximeter.;To learn and demonstrate proper pursed lip breathing techniques or other breathing techniques. ;To learn and understand importance of maintaining oxygen saturations>88% To learn and understand importance of monitoring SPO2 with pulse oximeter and demonstrate accurate use of the pulse oximeter.;To learn and demonstrate proper pursed lip breathing techniques or other breathing techniques. ;To learn and understand importance of maintaining oxygen saturations>88%     Long  Term Goals Verbalizes importance of monitoring SPO2 with pulse oximeter and return demonstration;Maintenance of O2 saturations>88%;Exhibits proper breathing techniques, such as pursed lip breathing or other method taught during program session;Compliance with respiratory medication Verbalizes importance of monitoring SPO2 with pulse oximeter and return demonstration;Maintenance of O2 saturations>88%;Exhibits proper breathing techniques, such as pursed lip breathing or other method taught during program session;Compliance with respiratory medication Verbalizes importance of monitoring SPO2 with pulse oximeter and return demonstration;Maintenance of O2 saturations>88%;Exhibits proper breathing techniques, such as pursed lip breathing or other method taught during program  session;Compliance with respiratory medication     Goals/Expected Outcomes compliance compliance compliance              Oxygen Discharge (Final Oxygen Re-Evaluation):  Oxygen Re-Evaluation - 12/29/22 1445       Program Oxygen Prescription   Program Oxygen Prescription None      Home Oxygen   Home  Oxygen Device Home Concentrator    Sleep Oxygen Prescription Continuous    Liters per minute 3    Home Exercise Oxygen Prescription None    Home Resting Oxygen Prescription None    Compliance with Home Oxygen Use No      Goals/Expected Outcomes   Short Term Goals To learn and understand importance of monitoring SPO2 with pulse oximeter and demonstrate accurate use of the pulse oximeter.;To learn and demonstrate proper pursed lip breathing techniques or other breathing techniques. ;To learn and understand importance of maintaining oxygen saturations>88%    Long  Term Goals Verbalizes importance of monitoring SPO2 with pulse oximeter and return demonstration;Maintenance of O2 saturations>88%;Exhibits proper breathing techniques, such as pursed lip breathing or other method taught during program session;Compliance with respiratory medication    Goals/Expected Outcomes compliance             Initial Exercise Prescription:  Initial Exercise Prescription - 10/19/22 1400       Date of Initial Exercise RX and Referring Provider   Date 10/19/22    Referring Provider Dr. Maia Plan    Expected Discharge Date 02/24/23      NuStep   Level 1    SPM 80    Minutes 39      Prescription Details   Frequency (times per week) 2    Duration Progress to 30 minutes of continuous aerobic without signs/symptoms of physical distress      Intensity   THRR 40-80% of Max Heartrate 59-118    Ratings of Perceived Exertion 11-13    Perceived Dyspnea 0-4      Resistance Training   Training Prescription Yes    Weight 2    Reps 10-15             Perform Capillary Blood Glucose checks  as needed.  Exercise Prescription Changes:   Exercise Prescription Changes     Row Name 11/25/22 1500 12/02/22 1534 12/28/22 1500         Response to Exercise   Blood Pressure (Admit) 104/60 120/70 114/64     Blood Pressure (Exercise) 100/62 112/64 110/78     Blood Pressure (Exit) 106/60 126/70 124/76     Heart Rate (Admit) 86 bpm 92 bpm 86 bpm     Heart Rate (Exercise) 83 bpm 88 bpm 88 bpm     Heart Rate (Exit) 81 bpm 84 bpm 81 bpm     Oxygen Saturation (Admit) 92 % 92 % 94 %     Oxygen Saturation (Exercise) 91 % 90 % 91 %     Oxygen Saturation (Exit) 90 % 93 % 95 %     Rating of Perceived Exertion (Exercise) 11 11 13      Perceived Dyspnea (Exercise) 11 11 13      Duration Continue with 30 min of aerobic exercise without signs/symptoms of physical distress. Continue with 30 min of aerobic exercise without signs/symptoms of physical distress. Continue with 30 min of aerobic exercise without signs/symptoms of physical distress.     Intensity THRR unchanged THRR unchanged THRR unchanged       Progression   Progression Continue to progress workloads to maintain intensity without signs/symptoms of physical distress. Continue to progress workloads to maintain intensity without signs/symptoms of physical distress. Continue to progress workloads to maintain intensity without signs/symptoms of physical distress.       Resistance Training   Training Prescription Yes Yes Yes     Weight 2 2 2  Reps 10-15 10-15 10-15     Time 10 Minutes 10 Minutes 10 Minutes       NuStep   Level 1 1 1      SPM 47 46 50     Minutes 39 39 39     METs 1.8 1.7 1.8              Exercise Comments:   Exercise Goals and Review:   Exercise Goals     Row Name 10/19/22 1443 11/03/22 0730 12/01/22 1453 12/29/22 1443       Exercise Goals   Increase Physical Activity Yes Yes Yes Yes    Intervention Provide advice, education, support and counseling about physical activity/exercise needs.;Develop an  individualized exercise prescription for aerobic and resistive training based on initial evaluation findings, risk stratification, comorbidities and participant's personal goals. Provide advice, education, support and counseling about physical activity/exercise needs.;Develop an individualized exercise prescription for aerobic and resistive training based on initial evaluation findings, risk stratification, comorbidities and participant's personal goals. Provide advice, education, support and counseling about physical activity/exercise needs.;Develop an individualized exercise prescription for aerobic and resistive training based on initial evaluation findings, risk stratification, comorbidities and participant's personal goals. Provide advice, education, support and counseling about physical activity/exercise needs.;Develop an individualized exercise prescription for aerobic and resistive training based on initial evaluation findings, risk stratification, comorbidities and participant's personal goals.    Expected Outcomes Short Term: Attend rehab on a regular basis to increase amount of physical activity.;Long Term: Add in home exercise to make exercise part of routine and to increase amount of physical activity.;Long Term: Exercising regularly at least 3-5 days a week. Short Term: Attend rehab on a regular basis to increase amount of physical activity.;Long Term: Add in home exercise to make exercise part of routine and to increase amount of physical activity.;Long Term: Exercising regularly at least 3-5 days a week. Short Term: Attend rehab on a regular basis to increase amount of physical activity.;Long Term: Add in home exercise to make exercise part of routine and to increase amount of physical activity.;Long Term: Exercising regularly at least 3-5 days a week. Short Term: Attend rehab on a regular basis to increase amount of physical activity.;Long Term: Add in home exercise to make exercise part of routine  and to increase amount of physical activity.;Long Term: Exercising regularly at least 3-5 days a week.    Increase Strength and Stamina Yes Yes Yes Yes    Intervention Provide advice, education, support and counseling about physical activity/exercise needs.;Develop an individualized exercise prescription for aerobic and resistive training based on initial evaluation findings, risk stratification, comorbidities and participant's personal goals. Provide advice, education, support and counseling about physical activity/exercise needs.;Develop an individualized exercise prescription for aerobic and resistive training based on initial evaluation findings, risk stratification, comorbidities and participant's personal goals. Provide advice, education, support and counseling about physical activity/exercise needs.;Develop an individualized exercise prescription for aerobic and resistive training based on initial evaluation findings, risk stratification, comorbidities and participant's personal goals. Provide advice, education, support and counseling about physical activity/exercise needs.;Develop an individualized exercise prescription for aerobic and resistive training based on initial evaluation findings, risk stratification, comorbidities and participant's personal goals.    Expected Outcomes Short Term: Increase workloads from initial exercise prescription for resistance, speed, and METs.;Short Term: Perform resistance training exercises routinely during rehab and add in resistance training at home;Long Term: Improve cardiorespiratory fitness, muscular endurance and strength as measured by increased METs and functional capacity ( ) Short Term: Increase workloads  from initial exercise prescription for resistance, speed, and METs.;Short Term: Perform resistance training exercises routinely during rehab and add in resistance training at home;Long Term: Improve cardiorespiratory fitness, muscular endurance and  strength as measured by increased METs and functional capacity ( ) Short Term: Increase workloads from initial exercise prescription for resistance, speed, and METs.;Short Term: Perform resistance training exercises routinely during rehab and add in resistance training at home;Long Term: Improve cardiorespiratory fitness, muscular endurance and strength as measured by increased METs and functional capacity ( ) Short Term: Increase workloads from initial exercise prescription for resistance, speed, and METs.;Short Term: Perform resistance training exercises routinely during rehab and add in resistance training at home;Long Term: Improve cardiorespiratory fitness, muscular endurance and strength as measured by increased METs and functional capacity ( )    Able to understand and use rate of perceived exertion (RPE) scale Yes Yes Yes Yes    Intervention Provide education and explanation on how to use RPE scale Provide education and explanation on how to use RPE scale Provide education and explanation on how to use RPE scale Provide education and explanation on how to use RPE scale    Expected Outcomes Short Term: Able to use RPE daily in rehab to express subjective intensity level;Long Term:  Able to use RPE to guide intensity level when exercising independently Short Term: Able to use RPE daily in rehab to express subjective intensity level;Long Term:  Able to use RPE to guide intensity level when exercising independently Short Term: Able to use RPE daily in rehab to express subjective intensity level;Long Term:  Able to use RPE to guide intensity level when exercising independently Short Term: Able to use RPE daily in rehab to express subjective intensity level;Long Term:  Able to use RPE to guide intensity level when exercising independently    Able to understand and use Dyspnea scale Yes Yes Yes Yes    Intervention Provide education and explanation on how to use Dyspnea scale Provide education and  explanation on how to use Dyspnea scale Provide education and explanation on how to use Dyspnea scale Provide education and explanation on how to use Dyspnea scale    Expected Outcomes Short Term: Able to use Dyspnea scale daily in rehab to express subjective sense of shortness of breath during exertion;Long Term: Able to use Dyspnea scale to guide intensity level when exercising independently Short Term: Able to use Dyspnea scale daily in rehab to express subjective sense of shortness of breath during exertion;Long Term: Able to use Dyspnea scale to guide intensity level when exercising independently Short Term: Able to use Dyspnea scale daily in rehab to express subjective sense of shortness of breath during exertion;Long Term: Able to use Dyspnea scale to guide intensity level when exercising independently Short Term: Able to use Dyspnea scale daily in rehab to express subjective sense of shortness of breath during exertion;Long Term: Able to use Dyspnea scale to guide intensity level when exercising independently    Knowledge and understanding of Target Heart Rate Range (THRR) Yes Yes Yes Yes    Intervention Provide education and explanation of THRR including how the numbers were predicted and where they are located for reference Provide education and explanation of THRR including how the numbers were predicted and where they are located for reference Provide education and explanation of THRR including how the numbers were predicted and where they are located for reference Provide education and explanation of THRR including how the numbers were predicted and where they are located for reference  Expected Outcomes Short Term: Able to state/look up THRR;Short Term: Able to use daily as guideline for intensity in rehab;Long Term: Able to use THRR to govern intensity when exercising independently Short Term: Able to state/look up THRR;Short Term: Able to use daily as guideline for intensity in rehab;Long Term:  Able to use THRR to govern intensity when exercising independently Short Term: Able to state/look up THRR;Short Term: Able to use daily as guideline for intensity in rehab;Long Term: Able to use THRR to govern intensity when exercising independently Short Term: Able to state/look up THRR;Short Term: Able to use daily as guideline for intensity in rehab;Long Term: Able to use THRR to govern intensity when exercising independently    Understanding of Exercise Prescription Yes Yes Yes Yes    Intervention Provide education, explanation, and written materials on patient's individual exercise prescription Provide education, explanation, and written materials on patient's individual exercise prescription Provide education, explanation, and written materials on patient's individual exercise prescription Provide education, explanation, and written materials on patient's individual exercise prescription    Expected Outcomes Long Term: Able to explain home exercise prescription to exercise independently;Short Term: Able to explain program exercise prescription Long Term: Able to explain home exercise prescription to exercise independently;Short Term: Able to explain program exercise prescription Long Term: Able to explain home exercise prescription to exercise independently;Short Term: Able to explain program exercise prescription Long Term: Able to explain home exercise prescription to exercise independently;Short Term: Able to explain program exercise prescription             Exercise Goals Re-Evaluation :  Exercise Goals Re-Evaluation     Row Name 11/03/22 0731 12/01/22 1453 12/29/22 1443         Exercise Goal Re-Evaluation   Exercise Goals Review Increase Physical Activity;Able to understand and use rate of perceived exertion (RPE) scale;Increase Strength and Stamina;Able to understand and use Dyspnea scale;Knowledge and understanding of Target Heart Rate Range (THRR);Understanding of Exercise  Prescription Increase Physical Activity;Increase Strength and Stamina;Able to understand and use rate of perceived exertion (RPE) scale;Able to understand and use Dyspnea scale;Knowledge and understanding of Target Heart Rate Range (THRR);Understanding of Exercise Prescription Increase Physical Activity;Increase Strength and Stamina;Able to understand and use rate of perceived exertion (RPE) scale;Able to understand and use Dyspnea scale;Knowledge and understanding of Target Heart Rate Range (THRR);Understanding of Exercise Prescription     Comments Pt has not attended a PR session yet. Her daughter reports that she has pneumonia, so she has missed her first two weeks. She is hopeful to begin 4/2. Unable to assess at this time due to lack of attendance. Pt has completed 2 sessions of PR. She is deconditioned and will progress slowly in  the program. She is currently exercising at 1.8 METs on  the stepper. Will continue to monitor and progress as able., Pt has completed 5 sessions of PR. She is deconditioned and will progress slowly. She has started to increase her SPM on the stepper which is a small goal. She is currently exercising at 1.8 METs on the stepper. Will continue to monitor and progress as able .     Expected Outcomes Through exercise at rehab and at home, the patient will meet their stated goals. Through exercise at rehab and at home, the patient will meet their stated goals. Through exercise at rehab and at home, the patient will meet their stated goals.              Discharge Exercise Prescription (Final Exercise Prescription  Changes):  Exercise Prescription Changes - 12/28/22 1500       Response to Exercise   Blood Pressure (Admit) 114/64    Blood Pressure (Exercise) 110/78    Blood Pressure (Exit) 124/76    Heart Rate (Admit) 86 bpm    Heart Rate (Exercise) 88 bpm    Heart Rate (Exit) 81 bpm    Oxygen Saturation (Admit) 94 %    Oxygen Saturation (Exercise) 91 %    Oxygen  Saturation (Exit) 95 %    Rating of Perceived Exertion (Exercise) 13    Perceived Dyspnea (Exercise) 13    Duration Continue with 30 min of aerobic exercise without signs/symptoms of physical distress.    Intensity THRR unchanged      Progression   Progression Continue to progress workloads to maintain intensity without signs/symptoms of physical distress.      Resistance Training   Training Prescription Yes    Weight 2    Reps 10-15    Time 10 Minutes      NuStep   Level 1    SPM 50    Minutes 39    METs 1.8             Nutrition:  Target Goals: Understanding of nutrition guidelines, daily intake of sodium 1500mg , cholesterol 200mg , calories 30% from fat and 7% or less from saturated fats, daily to have 5 or more servings of fruits and vegetables.  Biometrics:  Pre Biometrics - 10/19/22 1450       Pre Biometrics   Height 5' (1.524 m)    Weight 78 kg    Waist Circumference 44.5 inches    Hip Circumference 45.5 inches    Waist to Hip Ratio 0.98 %    BMI (Calculated) 33.58    Triceps Skinfold 43 mm    % Body Fat 49.1 %    Grip Strength 43 kg    Flexibility 0 in    Single Leg Stand 0 seconds              Nutrition Therapy Plan and Nutrition Goals:  Nutrition Therapy & Goals - 10/20/22 0737       Personal Nutrition Goals   Comments Patient scored 45 on diet assessment. We provide educational material on heart healthy nutrition with handouts and assistance with RD referral if patient is interested.      Intervention Plan   Intervention Nutrition handout(s) given to patient.             Nutrition Assessments:  Nutrition Assessments - 10/19/22 1315       MEDFICTS Scores   Pre Score 45            MEDIFICTS Score Key: ?70 Need to make dietary changes  40-70 Heart Healthy Diet ? 40 Therapeutic Level Cholesterol Diet   Picture Your Plate Scores: <16 Unhealthy dietary pattern with much room for improvement. 41-50 Dietary pattern unlikely  to meet recommendations for good health and room for improvement. 51-60 More healthful dietary pattern, with some room for improvement.  >60 Healthy dietary pattern, although there may be some specific behaviors that could be improved.    Nutrition Goals Re-Evaluation:   Nutrition Goals Discharge (Final Nutrition Goals Re-Evaluation):   Psychosocial: Target Goals: Acknowledge presence or absence of significant depression and/or stress, maximize coping skills, provide positive support system. Participant is able to verbalize types and ability to use techniques and skills needed for reducing stress and depression.  Initial Review & Psychosocial Screening:  Initial Psych Review & Screening - 10/19/22 1301       Initial Review   Current issues with Current Depression;Current Sleep Concerns      Family Dynamics   Good Support System? Yes    Comments Her son, daugher, son in law, grandson, and sister support her.      Barriers   Psychosocial barriers to participate in program There are no identifiable barriers or psychosocial needs.      Screening Interventions   Interventions Encouraged to exercise;Provide feedback about the scores to participant    Expected Outcomes Long Term goal: The participant improves quality of Life and PHQ9 Scores as seen by post scores and/or verbalization of changes;Short Term goal: Identification and review with participant of any Quality of Life or Depression concerns found by scoring the questionnaire.             Quality of Life Scores:  Quality of Life - 10/19/22 1432       Quality of Life   Select Quality of Life      Quality of Life Scores   Health/Function Pre 13.13 %    Socioeconomic Pre 26.92 %    Psych/Spiritual Pre 26.57 %    Family Pre 30 %    GLOBAL Pre 20.77 %            Scores of 19 and below usually indicate a poorer quality of life in these areas.  A difference of  2-3 points is a clinically meaningful difference.  A  difference of 2-3 points in the total score of the Quality of Life Index has been associated with significant improvement in overall quality of life, self-image, physical symptoms, and general health in studies assessing change in quality of life.   PHQ-9: Review Flowsheet       10/19/2022  Depression screen PHQ 2/9  Decreased Interest 1  Down, Depressed, Hopeless 1  PHQ - 2 Score 2  Altered sleeping 3  Tired, decreased energy 3  Change in appetite 1  Feeling bad or failure about yourself  1  Trouble concentrating 1  Moving slowly or fidgety/restless 0  Suicidal thoughts 0  PHQ-9 Score 11  Difficult doing work/chores Somewhat difficult   Interpretation of Total Score  Total Score Depression Severity:  1-4 = Minimal depression, 5-9 = Mild depression, 10-14 = Moderate depression, 15-19 = Moderately severe depression, 20-27 = Severe depression   Psychosocial Evaluation and Intervention:  Psychosocial Evaluation - 10/19/22 1454       Psychosocial Evaluation & Interventions   Interventions Stress management education;Relaxation education;Encouraged to exercise with the program and follow exercise prescription    Comments Pt has no barriers to participate in PR. She has no identifiable psychosocial issues. She does have depression, but she states that it is well managed with buspirone 20 mg twice daily and citalopram 20 mg once daily. Her husband passed away back in 2022-01-18, but she reports that she has been able to cope well with this. She is with her daughter, Joni Reining, today. Joni Reining is her primary caregiver and takes her to all of her appointments. Pt also reports problems with her sleep, but she does not take anything for her sleep. She scored a 14 on her PHQ-9, and most of this relates to her depression and the effects of her COPD. She reports not being able to go out much due to her SOB. Outside of her daughter, she also lists her son, son in law, grandson, and sister  as her support  system. Her goals while in the program are to decrease her SOB with exertion and to gain her confidence back. She would like to be more confident that she can do activities such as shopping or walking up stairs without becoming SOB. She is eager to start the program.    Expected Outcomes Pt's depression will continue to be treated, and she will have no other identifiable psychosocial issues.    Continue Psychosocial Services  No Follow up required             Psychosocial Re-Evaluation:  Psychosocial Re-Evaluation     Row Name 10/25/22 4540 11/19/22 1339 12/20/22 1431         Psychosocial Re-Evaluation   Current issues with History of Depression;None Identified;Current Psychotropic Meds History of Depression;None Identified;Current Psychotropic Meds History of Depression;Current Psychotropic Meds     Comments Patient has not started class yet, plans to this week.  Her initial PHQ-7 score is 14 and QOL score is 20.77%.  Patient was referred to PR with COPD via Dr. Renaldo Reel.  Her personal goals are to decrease her SOB with exertion, go up stairs without SOB and gain confidence to go back shopping. Patient plans to start soon.  Her initial PHQ-7 score is 14 and QOL score is 20.77%.  Patient was referred to PR with COPD via Dr. Renaldo Reel.  Her personal goals are to decrease her SOB with exertion, go up stairs without SOB and gain confidence to go back shopping. Patient has started with attending only 3 sessions.  Her attendance is limited due to transportation problems.  Patient was referred to PR with COPD via Dr. Renaldo Reel.  She reports having a history of depression that is managed with Buspar and citalopram.  She seems to enjoy coming to class and interactive with others in class. We will contiue to monitor her as she progresses in the program.     Expected Outcomes Patient will have no psychosocial issuses identified at discharge. Patient will have no psychosocial issuses identified at discharge. Patient  will have no psychosocial issuses identified at discharge.     Interventions Stress management education;Relaxation education;Encouraged to attend Pulmonary Rehabilitation for the exercise Stress management education;Relaxation education;Encouraged to attend Pulmonary Rehabilitation for the exercise Stress management education;Relaxation education;Encouraged to attend Pulmonary Rehabilitation for the exercise     Continue Psychosocial Services  No Follow up required No Follow up required No Follow up required              Psychosocial Discharge (Final Psychosocial Re-Evaluation):  Psychosocial Re-Evaluation - 12/20/22 1431       Psychosocial Re-Evaluation   Current issues with History of Depression;Current Psychotropic Meds    Comments Patient has started with attending only 3 sessions.  Her attendance is limited due to transportation problems.  Patient was referred to PR with COPD via Dr. Renaldo Reel.  She reports having a history of depression that is managed with Buspar and citalopram.  She seems to enjoy coming to class and interactive with others in class. We will contiue to monitor her as she progresses in the program.    Expected Outcomes Patient will have no psychosocial issuses identified at discharge.    Interventions Stress management education;Relaxation education;Encouraged to attend Pulmonary Rehabilitation for the exercise    Continue Psychosocial Services  No Follow up required              Education: Education Goals: Education classes will be provided on a weekly basis, covering  required topics. Participant will state understanding/return demonstration of topics presented.  Learning Barriers/Preferences:  Learning Barriers/Preferences - 10/19/22 1326       Learning Barriers/Preferences   Learning Barriers None    Learning Preferences Written Material             Education Topics: How Lungs Work and Diseases: - Discuss the anatomy of the lungs and diseases  that can affect the lungs, such as COPD.   Exercise: -Discuss the importance of exercise, FITT principles of exercise, normal and abnormal responses to exercise, and how to exercise safely.   Environmental Irritants: -Discuss types of environmental irritants and how to limit exposure to environmental irritants.   Meds/Inhalers and oxygen: - Discuss respiratory medications, definition of an inhaler and oxygen, and the proper way to use an inhaler and oxygen. Flowsheet Row PULMONARY REHAB OTHER RESPIRATORY from 12/23/2022 in [facility]  Date 11/25/22  Educator handout       Energy Saving Techniques: - Discuss methods to conserve energy and decrease shortness of breath when performing activities of daily living.  Flowsheet Row PULMONARY REHAB OTHER RESPIRATORY from 12/23/2022 in [place] [facility]  Date 12/02/22  Educator handout       Bronchial Hygiene / Breathing Techniques: - Discuss breathing mechanics, pursed-lip breathing technique,  proper posture, effective ways to clear airways, and other functional breathing techniques   Cleaning Equipment: - Provides group verbal and written instruction about the health risks of elevated stress, cause of high stress, and healthy ways to reduce stress.   Nutrition I: Fats: - Discuss the types of cholesterol, what cholesterol does to the body, and how cholesterol levels can be controlled. Flowsheet Row PULMONARY REHAB OTHER RESPIRATORY from 12/23/2022 in [facility]  Date 12/23/22  Educator handout       Nutrition II: Labels: -Discuss the different components of food labels and how to read food labels.   Respiratory Infections: - Discuss the signs and symptoms of respiratory infections, ways to prevent respiratory infections, and the importance of seeking medical treatment when having a respiratory infection.   Stress I: Signs and Symptoms: - Discuss the causes of  stress, how stress may lead to anxiety and depression, and ways to limit stress.   Stress II: Relaxation: -Discuss relaxation techniques to limit stress.   Oxygen for Home/Travel: - Discuss how to prepare for travel when on oxygen and proper ways to transport and store oxygen to ensure safety.   Knowledge Questionnaire Score:  Knowledge Questionnaire Score - 10/19/22 1327       Knowledge Questionnaire Score   Pre Score 0/18, did not complete, but reviewed all correct answers with patient             Core Components/Risk Factors/Patient Goals at Admission:  Personal Goals and Risk Factors at Admission - 10/19/22 1336       Core Components/Risk Factors/Patient Goals on Admission   Improve shortness of breath with ADL's Yes    Intervention Provide education, individualized exercise plan and daily activity instruction to help decrease symptoms of SOB with activities of daily living.    Expected Outcomes Short Term: Improve cardiorespiratory fitness to achieve a reduction of symptoms when performing ADLs;Long Term: Be able to perform more ADLs without symptoms or delay the onset of symptoms    Increase knowledge of respiratory medications and ability to use respiratory devices properly  Yes    Intervention Provide education and demonstration as needed of appropriate use of medications,  inhalers, and oxygen therapy.    Expected Outcomes Short Term: Achieves understanding of medications use. Understands that oxygen is a medication prescribed by physician. Demonstrates appropriate use of inhaler and oxygen therapy.;Long Term: Maintain appropriate use of medications, inhalers, and oxygen therapy.    Diabetes Yes    Intervention Provide education about signs/symptoms and action to take for hypo/hyperglycemia.;Provide education about proper nutrition, including hydration, and aerobic/resistive exercise prescription along with prescribed medications to achieve blood glucose in normal ranges:  Fasting glucose 65-99 mg/dL    Expected Outcomes Short Term: Participant verbalizes understanding of the signs/symptoms and immediate care of hyper/hypoglycemia, proper foot care and importance of medication, aerobic/resistive exercise and nutrition plan for blood glucose control.;Long Term: Attainment of HbA1C < 7%.    Personal Goal Other Yes    Personal Goal Increase confidence in doing things for herself and going out more.    Intervention Attend PR two days per week and begin a home exercise program.    Expected Outcomes Pt will meet stated goals.             Core Components/Risk Factors/Patient Goals Review:   Goals and Risk Factor Review     Row Name 10/25/22 0941 11/19/22 1342 12/20/22 1443         Core Components/Risk Factors/Patient Goals Review   Personal Goals Review Improve shortness of breath with ADL's;Develop more efficient breathing techniques such as purse lipped breathing and diaphragmatic breathing and practicing self-pacing with activity.;Other Improve shortness of breath with ADL's;Develop more efficient breathing techniques such as purse lipped breathing and diaphragmatic breathing and practicing self-pacing with activity.;Other Improve shortness of breath with ADL's;Develop more efficient breathing techniques such as purse lipped breathing and diaphragmatic breathing and practicing self-pacing with activity.;Other     Review Patient plans to start program soon.  Patient referred to PR with COPD via Dr. Renaldo Reel.  Her personal goals for the program is to decrease SOB with exertion, go up stairs without SOB and gain confidence to go back shopping without SOB.  We will continue to monitor her progress as she works toward meeting these goals. Patient plans to start program soon.  Patient referred to PR with COPD via Dr. Renaldo Reel.  Her personal goals for the program is to decrease SOB with exertion, go up stairs without SOB and gain confidence to go back shopping without SOB.  We  will continue to monitor her progress as she works toward meeting these goals. Patient has started program with only attending 3 sessions. Her low attendance is due to transportation problems.  Her personal goals for the program is to decrease SOB with exertion, go up stairs without SOB and gain confidence to go back shopping without SOB.  We will help her develpe a efficient breathing technique, such as purse lipped breathing and practice self pacing with increased activity.  We will continue to monitor her progress as she works toward meeting these goals.     Expected Outcomes Patient will complete the program meeting both program and personal goals. Patient will complete the program meeting both program and personal goals. Patient will complete the program meeting both program and personal goals.              Core Components/Risk Factors/Patient Goals at Discharge (Final Review):   Goals and Risk Factor Review - 12/20/22 1443       Core Components/Risk Factors/Patient Goals Review   Personal Goals Review Improve shortness of breath with ADL's;Develop more efficient breathing techniques such  as purse lipped breathing and diaphragmatic breathing and practicing self-pacing with activity.;Other    Review Patient has started program with only attending 3 sessions. Her low attendance is due to transportation problems.  Her personal goals for the program is to decrease SOB with exertion, go up stairs without SOB and gain confidence to go back shopping without SOB.  We will help her develpe a efficient breathing technique, such as purse lipped breathing and practice self pacing with increased activity.  We will continue to monitor her progress as she works toward meeting these goals.    Expected Outcomes Patient will complete the program meeting both program and personal goals.             ITP Comments:   Comments: ITP REVIEW Pt is making expected progress toward pulmonary rehab goals after  completing 6 sessions. Recommend continued exercise, life style modification, education, and utilization of breathing techniques to increase stamina and strength and decrease shortness of breath with exertion.

## 2022-12-30 ENCOUNTER — Encounter (HOSPITAL_COMMUNITY): Payer: Medicare Other

## 2023-01-04 ENCOUNTER — Encounter (HOSPITAL_COMMUNITY): Payer: Medicare Other

## 2023-01-06 ENCOUNTER — Encounter (HOSPITAL_COMMUNITY)
Admission: RE | Admit: 2023-01-06 | Discharge: 2023-01-06 | Disposition: A | Discharge status: Home / Self Care | Payer: Medicare Other | Source: Ambulatory Visit | Attending: Critical Care Medicine | Admitting: Critical Care Medicine

## 2023-01-06 DIAGNOSIS — J449 Chronic obstructive pulmonary disease, unspecified: Secondary | ICD-10-CM | POA: Diagnosis not present

## 2023-01-06 NOTE — Progress Notes (Signed)
Daily Session Note  Patient Details  Name: Kimberly Graves MRN: [id number] Date of Birth: August 25, 1950 Referring Provider:   Flowsheet Row PULMONARY REHAB COPD ORIENTATION from 10/19/2022 in [facility]  Referring Provider Dr. Maia Plan       Encounter Date: 01/06/2023  Check In:  Session Check In - 01/06/23 1457       Check-In   Supervising physician immediately available to respond to emergencies [facility] MD immediately available    Physician(s) Dr. Dina Rich    Location [facility]    Staff Present Kimberly Graves MHA, MS, ACSM-CEP;Kimberly Graves Kimberly Benes, RN, Kimberly Koch, RN, BSN    Virtual Visit No    Medication changes reported     No    Fall or balance concerns reported    Yes    Comments She has fallen several times over the past year and she uses a walker.    Tobacco Cessation No Change    Warm-up and Cool-down Performed as group-led instruction    Resistance Training Performed Yes    VAD Patient? No    PAD/SET Patient? No      Pain Assessment   Currently in Pain? No/denies    Pain Score 0-No pain    Multiple Pain Sites No             Capillary Blood Glucose: No results found for this or any previous visit (from the past 24 hour(s)).    Social History   Tobacco Use  Smoking Status Former   Types: Cigarettes   Quit date: 2007   Years since quitting: 17.4   Passive exposure: Past  Smokeless Tobacco Not on file    Goals Met:  Proper associated with RPD/PD & O2 Sat Independence with exercise equipment Using PLB without cueing & demonstrates good technique Exercise tolerated well No report of concerns or symptoms today Strength training completed today  Goals Unmet:  Not Applicable  Comments: Check out 1600.   Dr. Erick Blinks is Medical Director for [facility].

## 2023-01-11 ENCOUNTER — Other Ambulatory Visit: Payer: Self-pay | Admitting: "Endocrinology

## 2023-01-11 ENCOUNTER — Encounter (HOSPITAL_COMMUNITY)
Admission: RE | Admit: 2023-01-11 | Discharge: 2023-01-11 | Disposition: A | Discharge status: Home / Self Care | Payer: Medicare Other | Source: Ambulatory Visit | Attending: Critical Care Medicine | Admitting: Critical Care Medicine

## 2023-01-11 DIAGNOSIS — J449 Chronic obstructive pulmonary disease, unspecified: Secondary | ICD-10-CM | POA: Insufficient documentation

## 2023-01-12 LAB — COMPREHENSIVE METABOLIC PANEL
ALT: 41 IU/L — ABNORMAL HIGH (ref 0–32)
AST: 72 IU/L — ABNORMAL HIGH (ref 0–40)
Albumin/Globulin Ratio: 1.8 (ref 1.2–2.2)
Albumin: 4.2 g/dL (ref 3.8–4.8)
Alkaline Phosphatase: 62 IU/L (ref 44–121)
BUN/Creatinine Ratio: 18 (ref 12–28)
BUN: 12 mg/dL (ref 8–27)
Bilirubin Total: 0.3 mg/dL (ref 0.0–1.2)
CO2: 22 mmol/L (ref 20–29)
Calcium: 10.6 mg/dL — ABNORMAL HIGH (ref 8.7–10.3)
Chloride: 104 mmol/L (ref 96–106)
Creatinine, Ser: 0.68 mg/dL (ref 0.57–1.00)
Globulin, Total: 2.4 g/dL (ref 1.5–4.5)
Glucose: 218 mg/dL — ABNORMAL HIGH (ref 70–99)
Potassium: 4.7 mmol/L (ref 3.5–5.2)
Sodium: 141 mmol/L (ref 134–144)
Total Protein: 6.6 g/dL (ref 6.0–8.5)
eGFR: 92 mL/min/{1.73_m2} (ref >59)

## 2023-01-12 LAB — HEPATIC FUNCTION PANEL
ALT: 42 IU/L — ABNORMAL HIGH (ref 0–32)
AST: 73 IU/L — ABNORMAL HIGH (ref 0–40)
Albumin: 4.3 g/dL (ref 3.8–4.8)
Alkaline Phosphatase: 59 IU/L (ref 44–121)
Bilirubin Total: 0.3 mg/dL (ref 0.0–1.2)
Bilirubin, Direct: 0.13 mg/dL (ref 0.00–0.40)
Total Protein: 6.4 g/dL (ref 6.0–8.5)

## 2023-01-12 LAB — LIPID PANEL
Chol/HDL Ratio: 7.6 ratio — ABNORMAL HIGH (ref 0.0–4.4)
Cholesterol, Total: 214 mg/dL — ABNORMAL HIGH (ref 100–199)
HDL: 28 mg/dL — ABNORMAL LOW (ref >39)
LDL Chol Calc (NIH): 90 mg/dL (ref 0–99)
Triglycerides: 583 mg/dL (ref 0–149)
VLDL Cholesterol Cal: 96 mg/dL — ABNORMAL HIGH (ref 5–40)

## 2023-01-12 LAB — SPECIMEN STATUS REPORT

## 2023-01-12 LAB — TSH: TSH: 1.37 u[IU]/mL (ref 0.450–4.500)

## 2023-01-12 LAB — T4, FREE: Free T4: 1.13 ng/dL (ref 0.82–1.77)

## 2023-01-12 LAB — VITAMIN B12: Vitamin B-12: 559 pg/mL (ref 232–1245)

## 2023-01-13 ENCOUNTER — Encounter (HOSPITAL_COMMUNITY): Payer: Medicare Other

## 2023-01-18 ENCOUNTER — Encounter (HOSPITAL_COMMUNITY)
Admission: RE | Admit: 2023-01-18 | Discharge: 2023-01-18 | Disposition: A | Discharge status: Home / Self Care | Payer: Medicare Other | Source: Ambulatory Visit | Attending: Critical Care Medicine | Admitting: Critical Care Medicine

## 2023-01-18 DIAGNOSIS — J449 Chronic obstructive pulmonary disease, unspecified: Secondary | ICD-10-CM

## 2023-01-18 NOTE — Progress Notes (Signed)
Daily Session Note  Patient Details  Name: Kimberly Graves MRN: [id number] Date of Birth: [birth year] Referring Provider:   Flowsheet Row PULMONARY REHAB COPD ORIENTATION from 10/19/2022 in [facility]  Referring Provider Dr. Maia Plan       Encounter Date: 01/18/2023  Check In:  Session Check In - 01/18/23 1500       Check-In   Supervising physician immediately available to respond to emergencies [facility] MD immediately available    Physician(s) Dr. Wyline Mood    Location [facility]    Staff Present Erskine Speed, RN;Daphyne Daphine Deutscher, RN, BSN    Virtual Visit No    Medication changes reported     No    Fall or balance concerns reported    Yes    Comments She has fallen several times over the past year and she uses a walker.    Tobacco Cessation No Change    Warm-up and Cool-down Performed as group-led instruction    Resistance Training Performed Yes    VAD Patient? No    PAD/SET Patient? No      Pain Assessment   Currently in Pain? No/denies    Pain Score 0-No pain    Multiple Pain Sites No             Capillary Blood Glucose: No results found for this or any previous visit (from the past 24 hour(s)).    Social History   Tobacco Use  Smoking Status Former   Types: Cigarettes   Quit date: 2007   Years since quitting: 17.4   Passive exposure: Past  Smokeless Tobacco Not on file    Goals Met:  Using PLB without cueing & demonstrates good technique Exercise tolerated well Queuing for purse lip breathing No report of concerns or symptoms today Strength training completed today  Goals Unmet:  Not Applicable  Comments: check out @ 4:00pm   Dr. Erick Blinks is Medical Director for [facility] Pulmonary Re[facility].

## 2023-01-19 ENCOUNTER — Encounter: Payer: Self-pay | Admitting: "Endocrinology

## 2023-01-19 ENCOUNTER — Ambulatory Visit (INDEPENDENT_AMBULATORY_CARE_PROVIDER_SITE_OTHER): Payer: Medicare Other | Admitting: "Endocrinology

## 2023-01-19 VITALS — BP 100/68 | HR 108 | Ht 60.0 in | Wt 164.4 lb

## 2023-01-19 DIAGNOSIS — E1165 Type 2 diabetes mellitus with hyperglycemia: Secondary | ICD-10-CM

## 2023-01-19 DIAGNOSIS — I10 Essential (primary) hypertension: Secondary | ICD-10-CM

## 2023-01-19 DIAGNOSIS — E782 Mixed hyperlipidemia: Secondary | ICD-10-CM | POA: Diagnosis not present

## 2023-01-19 DIAGNOSIS — Z7985 Long-term (current) use of injectable non-insulin antidiabetic drugs: Secondary | ICD-10-CM

## 2023-01-19 LAB — POCT GLYCOSYLATED HEMOGLOBIN (HGB A1C)

## 2023-01-19 MED ORDER — EMPAGLIFLOZIN 10 MG PO TABS
10.0000 mg | ORAL_TABLET | Freq: Every day | ORAL | 1 refills | Status: DC
Start: 2023-01-19 — End: 2023-06-29

## 2023-01-19 MED ORDER — TRULICITY 4.5 MG/0.5ML ~~LOC~~ SOAJ: 4.5000 mg | SUBCUTANEOUS | 2 refills | Status: DC

## 2023-01-19 NOTE — Patient Instructions (Signed)
                                     Advice for Weight Management  -For most of us the best way to lose weight is by diet management. Generally speaking, diet management means consuming less calories intentionally which over time brings about progressive weight loss.  This can be achieved more effectively by avoiding ultra processed carbohydrates, processed meats, unhealthy fats.    It is critically important to know your numbers: how much calorie you are consuming and how much calorie you need. More importantly, our carbohydrates sources should be unprocessed naturally occurring  complex starch food items.  It is always important to balance nutrition also by  appropriate intake of proteins (mainly plant-based), healthy fats/oils, plenty of fruits and vegetables.   -The American College of Lifestyle Medicine (ACL M) recommends nutrition derived mostly from Whole Food, Plant Predominant Sources example an apple instead of applesauce or apple pie. Eat Plenty of vegetables, Mushrooms, fruits, Legumes, Whole Grains, Nuts, seeds in lieu of processed meats, processed snacks/pastries red meat, poultry, eggs.  Use only water or unsweetened tea for hydration.  The College also recommends the need to stay away from risky substances including alcohol, smoking; obtaining 7-9 hours of restorative sleep, at least 150 minutes of moderate intensity exercise weekly, importance of healthy social connections, and being mindful of stress and seek help when it is overwhelming.    -Sticking to a routine mealtime to eat 3 meals a day and avoiding unnecessary snacks is shown to have a big role in weight control. Under normal circumstances, the only time we burn stored energy is when we are hungry, so allow  some hunger to take place- hunger means no food between appropriate meal times, only water.  It is not advisable to starve.   -It is better to avoid simple carbohydrates including:  Cakes, Sweet Desserts, Ice Cream, Soda (diet and regular), Sweet Tea, Candies, Chips, Cookies, Store Bought Juices, Alcohol in Excess of  1-2 drinks a day, Lemonade,  Artificial Sweeteners, Doughnuts, Coffee Creamers, "Sugar-free" Products, etc, etc.  This is not a complete list.....    -Consulting with certified diabetes educators is proven to provide you with the most accurate and current information on diet.  Also, you may be  interested in discussing diet options/exchanges , we can schedule a visit with Kimberly Graves, RDN, CDE for individualized nutrition education.  -Exercise: If you are able: 30 -60 minutes a day ,4 days a week, or 150 minutes of moderate intensity exercise weekly.    The longer the better if tolerated.  Combine stretch, strength, and aerobic activities.  If you were told in the past that you have high risk for cardiovascular diseases, or if you are currently symptomatic, you may seek evaluation by your heart doctor prior to initiating moderate to intense exercise programs.                                  Additional Care Considerations for Diabetes/Prediabetes   -Diabetes  is a chronic disease.  The most important care consideration is regular follow-up with your diabetes care provider with the goal being avoiding or delaying its complications and to take advantage of advances in medications and technology.  If appropriate actions are taken early enough, type 2 diabetes can even be   reversed.  Seek information from the right source.  - Whole Food, Plant Predominant Nutrition is highly recommended: Eat Plenty of vegetables, Mushrooms, fruits, Legumes, Whole Grains, Nuts, seeds in lieu of processed meats, processed snacks/pastries red meat, poultry, eggs as recommended by American College of  Lifestyle Medicine (ACLM).  -Type 2 diabetes is known to coexist with other important comorbidities such as high blood pressure and high cholesterol.  It is critical to control not only the  diabetes but also the high blood pressure and high cholesterol to minimize and delay the risk of complications including coronary artery disease, stroke, amputations, blindness, etc.  The good news is that this diet recommendation for type 2 diabetes is also very helpful for managing high cholesterol and high blood blood pressure.  - Studies showed that people with diabetes will benefit from a class of medications known as ACE inhibitors and statins.  Unless there are specific reasons not to be on these medications, the standard of care is to consider getting one from these groups of medications at an optimal doses.  These medications are generally considered safe and proven to help protect the heart and the kidneys.    - People with diabetes are encouraged to initiate and maintain regular follow-up with eye doctors, foot doctors, dentists , and if necessary heart and kidney doctors.     - It is highly recommended that people with diabetes quit smoking or stay away from smoking, and get yearly  flu vaccine and pneumonia vaccine at least every 5 years.  See above for additional recommendations on exercise, sleep, stress management , and healthy social connections.      

## 2023-01-19 NOTE — Progress Notes (Signed)
01/19/2023, 2:55 PM  Endocrinology follow-up note   Subjective:    Patient ID: Kimberly Graves, female    DOB: April 14, 1951.  Kimberly Graves is being seen in follow-up after she was seen in consultation for management of currently uncontrolled symptomatic diabetes requested by  [name], MD.   Past Medical History:  Diagnosis Date   COPD (chronic obstructive pulmonary disease) (HCC)    Diabetes mellitus, type II (HCC)    Hypertension    Myocardial infarction (HCC) 2020   Neuromuscular disorder (HCC)    Rectal cancer (HCC)    Small cell carcinoma (HCC) 2007    Past Surgical History:  Procedure Laterality Date   BIOPSY  12/16/2022   Procedure: BIOPSY;  Surgeon: [name], MD;  Location: [facility];  Service: Gastroenterology;;   BREAST BIOPSY Right 08/04/2022   MM RT BREAST BX W LOC DEV 1ST LESION IMAGE BX SPEC STEREO GUIDE 08/04/2022 [facility]   COLONOSCOPY WITH PROPOFOL N/A 12/16/2022   Procedure: COLONOSCOPY WITH PROPOFOL;  Surgeon: [name], MD;  Location: [facility];  Service: Gastroenterology;  Laterality: N/A;  10:15AM;ASA 3   POLYPECTOMY  12/16/2022   Procedure: POLYPECTOMY;  Surgeon: [name], MD;  Location: [facility];  Service: Gastroenterology;;   TUMOR REMOVAL  2019    Social History   Socioeconomic History   Marital status: Widowed    Spouse name: Not on file   Number of children: Not on file   Years of education: Not on file   Highest education level: Not on file  Occupational History   Not on file  Tobacco Use   Smoking status: Former    Types: Cigarettes    Quit date: 2007    Years since quitting: 17.4    Passive exposure: Past   Smokeless tobacco: Not on file  Vaping Use   Vaping Use: Never used  Substance and Sexual Activity   Alcohol use: Not Currently   Drug use: Never   Sexual activity: Not on file   Other Topics Concern   Not on file  Social History Narrative   Not on file   Social Determinants of Health   Financial Resource Strain: Not on file  Food Insecurity: Not on file  Transportation Needs: Not on file  Physical Activity: Not on file  Stress: Not on file  Social Connections: Not on file    Family History  Problem Relation Age of Onset   Hypertension Mother    Cancer Mother    Hypertension Father     Outpatient Encounter Medications as of 01/19/2023  Medication Sig   Dulaglutide (TRULICITY) 4.5 MG/0.5ML SOPN Inject 4.5 mg as directed once a week.   empagliflozin (JARDIANCE) 10 MG TABS tablet Take 1 tablet (10 mg total) by mouth daily before breakfast.   acetaminophen (TYLENOL) 500 MG tablet Take 1,000 mg by mouth daily.   albuterol (PROVENTIL) (2.5 MG/3ML) 0.083% nebulizer solution Take 2.5 mg by nebulization every 6 (six) hours as needed for shortness of breath or wheezing.   albuterol (VENTOLIN HFA) 108 (90 Base) MCG/ACT inhaler Inhale 2 puffs into the lungs every 6 (six) hours  as needed for shortness of breath.   aspirin 81 MG chewable tablet Chew 81 mg by mouth at bedtime.   atorvastatin (LIPITOR) 80 MG tablet Take 1 tablet by mouth daily.   Blood Glucose Monitoring Suppl (ACCU-CHEK GUIDE ME) w/Device KIT 1 Piece by Does not apply route as directed.   busPIRone (BUSPAR) 10 MG tablet Take 10 mg by mouth 2 (two) times daily.   carvedilol (COREG) 3.125 MG tablet Take 3.125 mg by mouth 2 (two) times daily with a meal.   cetirizine (ZYRTEC) 10 MG tablet Take 10 mg by mouth daily.   citalopram (CELEXA) 20 MG tablet Take 20 mg by mouth daily.   Cranberry 200 MG CAPS Take 200 mg by mouth at bedtime.   fenofibrate (TRICOR) 145 MG tablet Take 290 mg by mouth daily.   glucose blood (ACCU-CHEK GUIDE) test strip Use as instructed   ibuprofen (ADVIL) 200 MG tablet Take 400 mg by mouth at bedtime.   Insulin Pen Needle (B-D ULTRAFINE III SHORT PEN) 31G X 8 MM MISC 1 each by  Does not apply route as directed.   levETIRAcetam (KEPPRA) 500 MG tablet Take 500 mg by mouth 2 (two) times daily.   lisinopril (ZESTRIL) 5 MG tablet Take 1 tablet (5 mg total) by mouth daily.   loperamide (IMODIUM A-D) 2 MG tablet Take 2 mg by mouth 4 (four) times daily as needed for diarrhea or loose stools.   Multiple Vitamin (MULTIVITAMIN) capsule Take 1 capsule by mouth daily.   omeprazole (PRILOSEC) 40 MG capsule Take 40 mg by mouth daily before breakfast.   pregabalin (LYRICA) 75 MG capsule Take 75 mg by mouth 2 (two) times daily.   TRELEGY ELLIPTA 200-62.5-25 MCG/ACT AEPB Inhale 1 puff into the lungs daily.   trolamine salicylate (ASPERCREME) 10 % cream Apply 1 Application topically as needed for muscle pain.   VASCEPA 1 g capsule TAKE ONE CAPSULE BY MOUTH TWICE DAILY WITH FOOD   [DISCONTINUED] glipiZIDE (GLUCOTROL XL) 5 MG 24 hr tablet Take 1 tablet (5 mg total) by mouth daily with breakfast. (Patient not taking: Reported on 10/19/2022)   [DISCONTINUED] TRULICITY 3 MG/0.5ML SOPN inject 3mg  SUBCUTANEOUSLY WEEKLY   No facility-administered encounter medications on file as of 01/19/2023.    ALLERGIES: Allergies  Allergen Reactions   Codeine Nausea And Vomiting   Lorcet [Hydrocodone-Acetaminophen] Nausea And Vomiting   Percocet [Oxycodone-Acetaminophen] Nausea And Vomiting    VACCINATION STATUS:  There is no immunization history on file for this patient.  Diabetes She presents for her follow-up diabetic visit. She has type 2 diabetes mellitus. Onset time: She was diagnosed at approximate age of 72 years. Her disease course has been worsening. There are no hypoglycemic associated symptoms. Pertinent negatives for diabetes include no polydipsia and no polyuria. There are no hypoglycemic complications. Symptoms are worsening. There are no diabetic complications. Risk factors for coronary artery disease include diabetes mellitus, dyslipidemia, sedentary lifestyle, post-menopausal and  tobacco exposure. Current diabetic treatments: She is currently on metformin 500 mg p.o. twice daily, recently added Trulicity at 1.5 mg weekly. Her weight is decreasing steadily. She is following a generally unhealthy diet. When asked about meal planning, she reported none. She has had a previous visit with a dietitian. She rarely participates in exercise. Her home blood glucose trend is increasing steadily. Her breakfast blood glucose range is generally 140-180 mg/dl. Her bedtime blood glucose range is generally 180-200 mg/dl. Her overall blood glucose range is 180-200 mg/dl. (Patient is returning with worsening  glycemic profile and point-of-care A1c of 9.3% increasing from 7.7%.  She continues to tolerate Trulicity 3 mg subcutaneously weekly, stop glipizide for unclear reasons.    ) An ACE inhibitor/angiotensin II receptor blocker is not being taken. She does not see a podiatrist.Eye exam is not current.  Hyperlipidemia This is a chronic problem. Exacerbating diseases include diabetes. Current antihyperlipidemic treatment includes fibric acid derivatives and statins. Risk factors for coronary artery disease include diabetes mellitus, dyslipidemia, hypertension, post-menopausal and a sedentary lifestyle.  Hypertension This is a chronic problem. The current episode started more than 1 year ago. Risk factors for coronary artery disease include diabetes mellitus and dyslipidemia. Past treatments include beta blockers.     Review of Systems  Endocrine: Negative for polydipsia and polyuria.    Objective:       01/19/2023    1:08 PM 01/11/2023    4:00 PM 12/16/2022   10:50 AM  Vitals with BMI  Height 5\' 0"     Weight 164 lbs 6 oz 166 lbs 4 oz   BMI 32.11    Systolic 100  141  Diastolic 68  88  Pulse 108  75    BP 100/68   Pulse (!) 108   Ht 5' (1.524 m)   Wt 164 lb 6.4 oz (74.6 kg)   BMI 32.11 kg/m   Wt Readings from Last 3 Encounters:  01/19/23 164 lb 6.4 oz (74.6 kg)  01/11/23 166 lb  3.6 oz (75.4 kg)  12/16/22 171 lb 15.3 oz (78 kg)     Physical Exam    CMP ( most recent) CMP     Component Value Date/Time   NA 141 01/11/2023 1617   K 4.7 01/11/2023 1617   CL 104 01/11/2023 1617   CO2 22 01/11/2023 1617   GLUCOSE 218 (H) 01/11/2023 1617   GLUCOSE 305 (H) 12/14/2022 1154   BUN 12 01/11/2023 1617   CREATININE 0.68 01/11/2023 1617   CREATININE 0.78 08/26/2022 1130   CALCIUM 10.6 (H) 01/11/2023 1617   PROT 6.4 01/11/2023 1622   ALBUMIN 4.3 01/11/2023 1622   AST 73 (H) 01/11/2023 1622   ALT 42 (H) 01/11/2023 1622   ALKPHOS 59 01/11/2023 1622   BILITOT 0.3 01/11/2023 1622   GFRNONAA >60 12/14/2022 1154     Diabetic Labs (most recent): Lab Results  Component Value Date   HGBA1C 7.7 (A) 08/12/2022   HGBA1C 8.1 (A) 03/31/2022   HGBA1C 6.7 (H) 10/24/2021   MICROALBUR 8.3 07/23/2020    Lipid Panel     Component Value Date/Time   CHOL 214 (H) 01/11/2023 1617   TRIG 583 (HH) 01/11/2023 1617   HDL 28 (L) 01/11/2023 1617   CHOLHDL 7.6 (H) 01/11/2023 1617   LDLCALC 90 01/11/2023 1617   LABVLDL 96 (H) 01/11/2023 1617      Assessment & Plan:   1. Type 2 diabetes mellitus with hyperglycemia, unspecified whether long term insulin use (HCC) - Donzetta Sprang has currently uncontrolled symptomatic type 2 DM since  72 years of age.  Patient is returning with worsening glycemic profile and point-of-care A1c of 9.3% increasing from 7.7%.  She continues to tolerate Trulicity 3 mg subcutaneously weekly, stop glipizide for unclear reasons.    - Recent labs reviewed. - I had a long discussion with her about the progressive nature of diabetes and the pathology behind its complications. -She does not report gross complications from her diabetes, however she remains at a high risk for more acute and chronic complications  which include CAD, CVA, CKD, retinopathy, and neuropathy. These are all discussed in detail with her.  - I have counseled her on diet  and weight  management  by adopting a carbohydrate restricted/protein rich diet. Patient is encouraged to switch to  unprocessed or minimally processed     complex starch and increased protein intake (animal or plant source), fruits, and vegetables. -  she is advised to stick to a routine mealtimes to eat 3 meals  a day and avoid unnecessary snacks ( to snack only to correct hypoglycemia).   - she acknowledges that there is a room for improvement in her food and drink choices. - Suggestion is made for her to avoid simple carbohydrates  from her diet including Cakes, Sweet Desserts, Ice Cream, Soda (diet and regular), Sweet Tea, Candies, Chips, Cookies, Store Bought Juices, Alcohol , Artificial Sweeteners,  Coffee Creamer, and "Sugar-free" Products, Lemonade. This will help patient to have more stable blood glucose profile and potentially avoid unintended weight gain.  - she will be scheduled with Norm Salt, RDN, CDE for diabetes education.  - I have approached her with the following individualized plan to manage  her diabetes and patient agrees:    -Based on her presentation with A1c of 9.3%, she will need more intensive treatment.  Will be hard for her to execute insulin treatment.  I discussed and increase her Trulicity to 4.5 mg subcutaneously weekly.    -She is willing and advised to continue monitoring blood glucose twice a day-daily before breakfast and at bedtime. - she is encouraged to call clinic for blood glucose levels less than 70 or above 200 mg /dl. -She has not taken glipizide.  She would benefit from [place] I discussed and prescribed Jardiance 10 mg.   - Specific targets for  A1c;  LDL, HDL,  and Triglycerides were discussed with the patient.  2) Blood Pressure /Hypertension: Her blood pressure is controlled faggot.  she is advised to continue her current blood pressure medication including carvedilol 3.25 mg p.o. twice daily.     3) Lipids/Hyperlipidemia:   Review of her recent  lipid panel showed un controlled hypertriglyceridemia at 583.    She is currently on fenofibrate 145 mg p.o. daily, as well as atorvastatin 80 mg p.o. nightly.  She is advised to continue on both.    Whole food plant-based diet will help with dyslipidemia as well as hypertension.   4)  Weight/Diet:  Body mass index is 32.11 kg/m.  -      she is  a candidate for some weight loss, recently experienced unintended weight loss.  I discussed with her the fact that loss of 5 - 10% of her  current body weight will have the most impact on her diabetes management.  Exercise, and detailed carbohydrates information provided  -  detailed on discharge instructions.  5) Chronic Care/Health Maintenance:  -she  is on  Statin medications and  is encouraged to initiate and continue to follow up with Ophthalmology, Dentist,  Podiatrist at least yearly or according to recommendations, and advised to   stay away from smoking. I have recommended yearly flu vaccine and pneumonia vaccine at least every 5 years; moderate intensity exercise for up to 150 minutes weekly; and  sleep for at least 7 hours a day.  - she is  advised to maintain close follow up with [name], MD for primary care needs, as well as her other providers for optimal and coordinated care.   I  spent  26  minutes in the care of the patient today including review of labs from CMP, Lipids, Thyroid Function, Hematology (current and previous including abstractions from other facilities); face-to-face time discussing  her blood glucose readings/logs, discussing hypoglycemia and hyperglycemia episodes and symptoms, medications doses, her options of short and long term treatment based on the latest standards of care / guidelines;  discussion about incorporating lifestyle medicine;  and documenting the encounter. Risk reduction counseling performed per USPSTF guidelines to reduce  obesity and cardiovascular risk factors.     Please refer to Patient Instructions  for Blood Glucose Monitoring and Insulin/Medications Dosing Guide"  in media tab for additional information. Please  also refer to " Patient Self Inventory" in the Media  tab for reviewed elements of pertinent patient history.  Kimberly Graves participated in the discussions, expressed understanding, and voiced agreement with the above plans.  All questions were answered to her satisfaction. she is encouraged to contact clinic should she have any questions or concerns prior to her return visit.    Follow up plan: - Return in about 3 months (around 04/21/2023) for Bring Meter/CGM Device/Logs- A1c in Office.  Marquis Lunch, MD [facility] [address] [place], [place] [address] Phone: [phone]  Fax: [phone]    01/19/2023, 2:55 PM  This note was partially dictated with voice recognition software. Similar sounding words can be transcribed inadequately or may not  be corrected upon review.

## 2023-01-20 ENCOUNTER — Encounter (HOSPITAL_COMMUNITY): Payer: Medicare Other

## 2023-01-25 ENCOUNTER — Encounter (HOSPITAL_COMMUNITY)
Admission: RE | Admit: 2023-01-25 | Discharge: 2023-01-25 | Disposition: A | Discharge status: Home / Self Care | Payer: Medicare Other | Source: Ambulatory Visit | Attending: Critical Care Medicine | Admitting: Critical Care Medicine

## 2023-01-25 VITALS — Wt 168.7 lb

## 2023-01-25 DIAGNOSIS — J449 Chronic obstructive pulmonary disease, unspecified: Secondary | ICD-10-CM

## 2023-01-25 NOTE — Progress Notes (Signed)
Daily Session Note  Patient Details  Name: Kimberly Graves MRN: [id number] Date of Birth: 03-Jun-1951 Referring Provider:   Flowsheet Row PULMONARY REHAB COPD ORIENTATION from 10/19/2022 in [facility] CARDIAC RE[facility]ATION  Referring Provider Dr. Maia Plan       Encounter Date: 01/25/2023  Check In:  Session Check In - 01/25/23 1500       Check-In   Supervising physician immediately available to respond to emergencies [facility] MD immediately available    Physician(s) Dr Diona Browner    Location [facility]    Staff Present Ross Ludwig, BS, Exercise Physiologist;Danny Gala Romney, RN, BSN    Virtual Visit No    Medication changes reported     No    Fall or balance concerns reported    Yes    Comments She has fallen several times over the past year and she uses a walker.    Tobacco Cessation No Change    Warm-up and Cool-down Performed as group-led instruction    Resistance Training Performed Yes      Pain Assessment   Currently in Pain? No/denies    Pain Score 0-No pain             Capillary Blood Glucose: No results found for this or any previous visit (from the past 24 hour(s)).    Social History   Tobacco Use  Smoking Status Former   Types: Cigarettes   Quit date: 2007   Years since quitting: 17.4   Passive exposure: Past  Smokeless Tobacco Not on file    Goals Met:  Proper associated with RPD/PD & O2 Sat Independence with exercise equipment Using PLB without cueing & demonstrates good technique Exercise tolerated well Queuing for purse lip breathing No report of concerns or symptoms today Strength training completed today  Goals Unmet:  Not Applicable  Comments: Checkout at 1600.   Dr. Erick Blinks is Medical Director for [facility].

## 2023-01-26 NOTE — Progress Notes (Signed)
Pulmonary Individual Treatment Plan  Patient Details  Name: Kimberly Graves MRN: [id number] Date of Birth: [birth year] Referring Provider:   Flowsheet Row PULMONARY REHAB COPD ORIENTATION from 10/19/2022 in [facility] CARDIAC REHABILITATION  Referring Provider Dr. Maia Plan       Initial Encounter Date:  Flowsheet Row PULMONARY REHAB COPD ORIENTATION from 10/19/2022 in [facility]  Date 10/19/22       Visit Diagnosis: Chronic obstructive pulmonary disease, unspecified COPD type (HCC)  Patient's Home Medications on Admission:   Current Outpatient Medications:    acetaminophen (TYLENOL) 500 MG tablet, Take 1,000 mg by mouth daily., Disp: , Rfl:    albuterol (PROVENTIL) (2.5 MG/3ML) 0.083% nebulizer solution, Take 2.5 mg by nebulization every 6 (six) hours as needed for shortness of breath or wheezing., Disp: , Rfl:    albuterol (VENTOLIN HFA) 108 (90 Base) MCG/ACT inhaler, Inhale 2 puffs into the lungs every 6 (six) hours as needed for shortness of breath., Disp: , Rfl:    aspirin 81 MG chewable tablet, Chew 81 mg by mouth at bedtime., Disp: , Rfl:    atorvastatin (LIPITOR) 80 MG tablet, Take 1 tablet by mouth daily., Disp: , Rfl:    Blood Glucose Monitoring Suppl (ACCU-CHEK GUIDE ME) w/Device KIT, 1 Piece by Does not apply route as directed., Disp: 1 kit, Rfl: 0   busPIRone (BUSPAR) 10 MG tablet, Take 10 mg by mouth 2 (two) times daily., Disp: , Rfl:    carvedilol (COREG) 3.125 MG tablet, Take 3.125 mg by mouth 2 (two) times daily with a meal., Disp: , Rfl:    cetirizine (ZYRTEC) 10 MG tablet, Take 10 mg by mouth daily., Disp: , Rfl:    citalopram (CELEXA) 20 MG tablet, Take 20 mg by mouth daily., Disp: , Rfl:    Cranberry 200 MG CAPS, Take 200 mg by mouth at bedtime., Disp: , Rfl:    Dulaglutide (TRULICITY) 4.5 MG/0.5ML SOPN, Inject 4.5 mg as directed once a week., Disp: 2 mL, Rfl: 2   empagliflozin (JARDIANCE) 10 MG TABS tablet, Take 1 tablet (10 mg total)  by mouth daily before breakfast., Disp: 90 tablet, Rfl: 1   fenofibrate (TRICOR) 145 MG tablet, Take 290 mg by mouth daily., Disp: , Rfl:    glucose blood (ACCU-CHEK GUIDE) test strip, Use as instructed, Disp: 100 each, Rfl: 2   ibuprofen (ADVIL) 200 MG tablet, Take 400 mg by mouth at bedtime., Disp: , Rfl:    Insulin Pen Needle (B-D ULTRAFINE III SHORT PEN) 31G X 8 MM MISC, 1 each by Does not apply route as directed., Disp: 50 each, Rfl: 1   levETIRAcetam (KEPPRA) 500 MG tablet, Take 500 mg by mouth 2 (two) times daily., Disp: , Rfl:    lisinopril (ZESTRIL) 5 MG tablet, Take 1 tablet (5 mg total) by mouth daily., Disp: , Rfl:    loperamide (IMODIUM A-D) 2 MG tablet, Take 2 mg by mouth 4 (four) times daily as needed for diarrhea or loose stools., Disp: , Rfl:    Multiple Vitamin (MULTIVITAMIN) capsule, Take 1 capsule by mouth daily., Disp: , Rfl:    omeprazole (PRILOSEC) 40 MG capsule, Take 40 mg by mouth daily before breakfast., Disp: , Rfl:    pregabalin (LYRICA) 75 MG capsule, Take 75 mg by mouth 2 (two) times daily., Disp: , Rfl:    TRELEGY ELLIPTA 200-62.5-25 MCG/ACT AEPB, Inhale 1 puff into the lungs daily., Disp: , Rfl:    trolamine salicylate (ASPERCREME) 10 % cream, Apply  1 Application topically as needed for muscle pain., Disp: , Rfl:    VASCEPA 1 g capsule, TAKE ONE CAPSULE BY MOUTH TWICE DAILY WITH FOOD, Disp: 60 capsule, Rfl: 5  Past Medical History: Past Medical History:  Diagnosis Date   COPD (chronic obstructive pulmonary disease) (HCC)    Diabetes mellitus, type II (HCC)    Hypertension    Myocardial infarction (HCC) 2020   Neuromuscular disorder (HCC)    Rectal cancer (HCC)    Small cell carcinoma (HCC) 2007    Tobacco Use: Social History   Tobacco Use  Smoking Status Former   Types: Cigarettes   Quit date: 2007   Years since quitting: 17.4   Passive exposure: Past  Smokeless Tobacco Not on file    Labs: Review Flowsheet  More data exists      Latest Ref  Rng & Units 10/24/2021 03/31/2022 04/27/2022 08/12/2022 01/11/2023  Labs for ITP Cardiac and Pulmonary Rehab  Cholestrol 100 - 199 mg/dL - - 409     - 811   LDL (calc) 0 - 99 mg/dL - - - - 90   HDL-C >91 mg/dL - - 31     - 28   Trlycerides 0 - 149 mg/dL - - 478     - 295   Hemoglobin A1c 0.0 - 7.0 % 6.7  8.1  - 7.7  -    Details       This result is from an external source.         Capillary Blood Glucose: Lab Results  Component Value Date   GLUCAP 173 (H) 12/16/2022   GLUCAP 236 (H) 12/02/2022   GLUCAP 230 (H) 11/25/2022   GLUCAP 247 (H) 11/23/2022   GLUCAP 168 (H) 10/25/2021    POCT Glucose     Row Name 10/19/22 1340             POCT Blood Glucose   Pre-Exercise 149 mg/dL                Pulmonary Assessment Scores:  Pulmonary Assessment Scores     Row Name 10/19/22 1255         ADL UCSD   ADL Phase Entry     SOB Score total 88     Rest 2     Walk 4     Stairs 5     Bath 4     Dress 3     Shop 3       CAT Score   CAT Score 33       mMRC Score   mMRC Score 4             UCSD: Self-administered rating of dyspnea associated with activities of daily living (ADLs) 6-point scale (0 = "not at all" to 5 = "maximal or unable to do because of breathlessness")  Scoring Scores range from 0 to 120.  Minimally important difference is 5 units  CAT: CAT can identify the health impairment of COPD patients and is better correlated with disease progression.  CAT has a scoring range of zero to 40. The CAT score is classified into four groups of low (less than 10), medium (10 - 20), high (21-30) and very high (31-40) based on the impact level of disease on health status. A CAT score over 10 suggests significant symptoms.  A worsening CAT score could be explained by an exacerbation, poor medication adherence, poor inhaler technique, or progression of COPD or comorbid conditions.  CAT MCID is 2 points  mMRC: mMRC (Modified Medical Research Council) Dyspnea Scale  is used to assess the degree of baseline functional disability in patients of respiratory disease due to dyspnea. No minimal important difference is established. A decrease in score of 1 point or greater is considered a positive change.   Pulmonary Function Assessment:   Exercise Target Goals: Exercise Program Goal: Individual exercise prescription set using results from initial 6 min walk test and THRR while considering  patient's activity barriers and safety.   Exercise Prescription Goal: Initial exercise prescription builds to 30-45 minutes a day of aerobic activity, 2-3 days per week.  Home exercise guidelines will be given to patient during program as part of exercise prescription that the participant will acknowledge.  Activity Barriers & Risk Stratification:  Activity Barriers & Cardiac Risk Stratification - 10/19/22 1257       Activity Barriers & Cardiac Risk Stratification   Activity Barriers Arthritis;Back Problems;Joint Problems;Deconditioning;Shortness of Breath;Balance Concerns;History of Falls;Assistive Device    Cardiac Risk Stratification High             6 Minute Walk:  6 Minute Walk     Row Name 10/19/22 1433         6 Minute Walk   Phase Initial     Distance 450 feet     Walk Time 6 minutes     # of Rest Breaks 1     MPH 0.85     RPE 11     Perceived Dyspnea  11     Symptoms Yes (comment)     Comments 1 seated rest break for 1 min 20 sec due to generalized fatigue. Reported right knee pain 4/10     Resting HR 81 bpm     Resting BP 116/72     Resting Oxygen Saturation  94 %     Exercise Oxygen Saturation  during 6 min walk 92 %     Max Ex. HR 94 bpm     Max Ex. BP 144/74     2 Minute Post BP 116/70       Interval HR   1 Minute HR 85     2 Minute HR 83     3 Minute HR 90     4 Minute HR 85     5 Minute HR 90     6 Minute HR 94     2 Minute Post HR 79     Interval Heart Rate? Yes       Interval Oxygen   Interval Oxygen? Yes     Baseline  Oxygen Saturation % 94 %     1 Minute Oxygen Saturation % 95 %     1 Minute Liters of Oxygen 0 L     2 Minute Oxygen Saturation % 92 %     2 Minute Liters of Oxygen 0 L     3 Minute Oxygen Saturation % 92 %     3 Minute Liters of Oxygen 0 L     4 Minute Oxygen Saturation % 93 %     4 Minute Liters of Oxygen 0 L     5 Minute Oxygen Saturation % 93 %     5 Minute Liters of Oxygen 0 L     6 Minute Oxygen Saturation % 93 %     6 Minute Liters of Oxygen 0 L     2 Minute Post Oxygen Saturation % 95 %  2 Minute Post Liters of Oxygen 0 L              Oxygen Initial Assessment:  Oxygen Initial Assessment - 10/19/22 1432       Home Oxygen   Home Oxygen Device Home Concentrator    Sleep Oxygen Prescription Continuous    Liters per minute 3    Home Exercise Oxygen Prescription None    Home Resting Oxygen Prescription None    Compliance with Home Oxygen Use No      Initial 6 min Walk   Oxygen Used None      Program Oxygen Prescription   Program Oxygen Prescription None      Intervention   Short Term Goals To learn and understand importance of monitoring SPO2 with pulse oximeter and demonstrate accurate use of the pulse oximeter.;To learn and understand importance of maintaining oxygen saturations>88%;To learn and demonstrate proper pursed lip breathing techniques or other breathing techniques.     Long  Term Goals Verbalizes importance of monitoring SPO2 with pulse oximeter and return demonstration;Maintenance of O2 saturations>88%;Exhibits proper breathing techniques, such as pursed lip breathing or other method taught during program session;Compliance with respiratory medication             Oxygen Re-Evaluation:  Oxygen Re-Evaluation     Row Name 11/03/22 0730 12/01/22 1454 12/29/22 1445 01/26/23 1040       Program Oxygen Prescription   Program Oxygen Prescription None None None None      Home Oxygen   Home Oxygen Device Home Concentrator Home Concentrator Home  Concentrator Home Concentrator    Sleep Oxygen Prescription Continuous Continuous Continuous Continuous    Liters per minute 3 3 3 3     Home Exercise Oxygen Prescription None None None None    Home Resting Oxygen Prescription None None None None    Compliance with Home Oxygen Use No No No No      Goals/Expected Outcomes   Short Term Goals To learn and understand importance of monitoring SPO2 with pulse oximeter and demonstrate accurate use of the pulse oximeter.;To learn and understand importance of maintaining oxygen saturations>88%;To learn and demonstrate proper pursed lip breathing techniques or other breathing techniques.  To learn and understand importance of monitoring SPO2 with pulse oximeter and demonstrate accurate use of the pulse oximeter.;To learn and demonstrate proper pursed lip breathing techniques or other breathing techniques. ;To learn and understand importance of maintaining oxygen saturations>88% To learn and understand importance of monitoring SPO2 with pulse oximeter and demonstrate accurate use of the pulse oximeter.;To learn and demonstrate proper pursed lip breathing techniques or other breathing techniques. ;To learn and understand importance of maintaining oxygen saturations>88% To learn and understand importance of monitoring SPO2 with pulse oximeter and demonstrate accurate use of the pulse oximeter.;To learn and demonstrate proper pursed lip breathing techniques or other breathing techniques. ;To learn and understand importance of maintaining oxygen saturations>88%    Long  Term Goals Verbalizes importance of monitoring SPO2 with pulse oximeter and return demonstration;Maintenance of O2 saturations>88%;Exhibits proper breathing techniques, such as pursed lip breathing or other method taught during program session;Compliance with respiratory medication Verbalizes importance of monitoring SPO2 with pulse oximeter and return demonstration;Maintenance of O2  saturations>88%;Exhibits proper breathing techniques, such as pursed lip breathing or other method taught during program session;Compliance with respiratory medication Verbalizes importance of monitoring SPO2 with pulse oximeter and return demonstration;Maintenance of O2 saturations>88%;Exhibits proper breathing techniques, such as pursed lip breathing or other method taught during program session;Compliance  with respiratory medication Verbalizes importance of monitoring SPO2 with pulse oximeter and return demonstration;Maintenance of O2 saturations>88%;Exhibits proper breathing techniques, such as pursed lip breathing or other method taught during program session;Compliance with respiratory medication    Goals/Expected Outcomes compliance compliance compliance compliance             Oxygen Discharge (Final Oxygen Re-Evaluation):  Oxygen Re-Evaluation - 01/26/23 1040       Program Oxygen Prescription   Program Oxygen Prescription None      Home Oxygen   Home Oxygen Device Home Concentrator    Sleep Oxygen Prescription Continuous    Liters per minute 3    Home Exercise Oxygen Prescription None    Home Resting Oxygen Prescription None    Compliance with Home Oxygen Use No      Goals/Expected Outcomes   Short Term Goals To learn and understand importance of monitoring SPO2 with pulse oximeter and demonstrate accurate use of the pulse oximeter.;To learn and demonstrate proper pursed lip breathing techniques or other breathing techniques. ;To learn and understand importance of maintaining oxygen saturations>88%    Long  Term Goals Verbalizes importance of monitoring SPO2 with pulse oximeter and return demonstration;Maintenance of O2 saturations>88%;Exhibits proper breathing techniques, such as pursed lip breathing or other method taught during program session;Compliance with respiratory medication    Goals/Expected Outcomes compliance             Initial Exercise Prescription:  Initial  Exercise Prescription - 10/19/22 1400       Date of Initial Exercise RX and Referring Provider   Date 10/19/22    Referring Provider Dr. Maia Plan    Expected Discharge Date 02/24/23      NuStep   Level 1    SPM 80    Minutes 39      Prescription Details   Frequency (times per week) 2    Duration Progress to 30 minutes of continuous aerobic without signs/symptoms of physical distress      Intensity   THRR 40-80% of Max Heartrate 59-118    Ratings of Perceived Exertion 11-13    Perceived Dyspnea 0-4      Resistance Training   Training Prescription Yes    Weight 2    Reps 10-15             Perform Capillary Blood Glucose checks as needed.  Exercise Prescription Changes:   Exercise Prescription Changes     Row Name 11/25/22 1500 12/02/22 1534 12/28/22 1500 01/11/23 1600 01/25/23 1500     Response to Exercise   Blood Pressure (Admit) 104/60 120/70 114/64 112/60 120/68   Blood Pressure (Exercise) 100/62 112/64 110/78 120/70 124/74   Blood Pressure (Exit) 106/60 126/70 124/76 130/62 124/64   Heart Rate (Admit) 86 bpm 92 bpm 86 bpm 92 bpm 76 bpm   Heart Rate (Exercise) 83 bpm 88 bpm 88 bpm 87 bpm 82 bpm   Heart Rate (Exit) 81 bpm 84 bpm 81 bpm 82 bpm 76 bpm   Oxygen Saturation (Admit) 92 % 92 % 94 % 92 % 95 %   Oxygen Saturation (Exercise) 91 % 90 % 91 % 92 % 93 %   Oxygen Saturation (Exit) 90 % 93 % 95 % 95 % 94 %   Rating of Perceived Exertion (Exercise) 11 11 13 12 12    Perceived Dyspnea (Exercise) 11 11 13 13 12    Duration Continue with 30 min of aerobic exercise without signs/symptoms of physical distress. Continue with 30 min  of aerobic exercise without signs/symptoms of physical distress. Continue with 30 min of aerobic exercise without signs/symptoms of physical distress. Continue with 30 min of aerobic exercise without signs/symptoms of physical distress. Continue with 30 min of aerobic exercise without signs/symptoms of physical distress.   Intensity THRR  unchanged THRR unchanged THRR unchanged THRR unchanged THRR unchanged     Progression   Progression Continue to progress workloads to maintain intensity without signs/symptoms of physical distress. Continue to progress workloads to maintain intensity without signs/symptoms of physical distress. Continue to progress workloads to maintain intensity without signs/symptoms of physical distress. Continue to progress workloads to maintain intensity without signs/symptoms of physical distress. Continue to progress workloads to maintain intensity without signs/symptoms of physical distress.     Resistance Training   Training Prescription Yes Yes Yes Yes Yes   Weight 2 2 2 2 2    Reps 10-15 10-15 10-15 10-15 10-15   Time 10 Minutes 10 Minutes 10 Minutes 10 Minutes 10 Minutes     NuStep   Level 1 1 1 1 2    SPM 47 46 50 51 48   Minutes 39 39 39 39 39   METs 1.8 1.7 1.8 1.7 1.7            Exercise Comments:   Exercise Goals and Review:   Exercise Goals     Row Name 10/19/22 1443 11/03/22 0730 12/01/22 1453 12/29/22 1443 01/26/23 1038     Exercise Goals   Increase Physical Activity Yes Yes Yes Yes Yes   Intervention Provide advice, education, support and counseling about physical activity/exercise needs.;Develop an individualized exercise prescription for aerobic and resistive training based on initial evaluation findings, risk stratification, comorbidities and participant's personal goals. Provide advice, education, support and counseling about physical activity/exercise needs.;Develop an individualized exercise prescription for aerobic and resistive training based on initial evaluation findings, risk stratification, comorbidities and participant's personal goals. Provide advice, education, support and counseling about physical activity/exercise needs.;Develop an individualized exercise prescription for aerobic and resistive training based on initial evaluation findings, risk stratification,  comorbidities and participant's personal goals. Provide advice, education, support and counseling about physical activity/exercise needs.;Develop an individualized exercise prescription for aerobic and resistive training based on initial evaluation findings, risk stratification, comorbidities and participant's personal goals. Provide advice, education, support and counseling about physical activity/exercise needs.;Develop an individualized exercise prescription for aerobic and resistive training based on initial evaluation findings, risk stratification, comorbidities and participant's personal goals.   Expected Outcomes Short Term: Attend rehab on a regular basis to increase amount of physical activity.;Long Term: Add in home exercise to make exercise part of routine and to increase amount of physical activity.;Long Term: Exercising regularly at least 3-5 days a week. Short Term: Attend rehab on a regular basis to increase amount of physical activity.;Long Term: Add in home exercise to make exercise part of routine and to increase amount of physical activity.;Long Term: Exercising regularly at least 3-5 days a week. Short Term: Attend rehab on a regular basis to increase amount of physical activity.;Long Term: Add in home exercise to make exercise part of routine and to increase amount of physical activity.;Long Term: Exercising regularly at least 3-5 days a week. Short Term: Attend rehab on a regular basis to increase amount of physical activity.;Long Term: Add in home exercise to make exercise part of routine and to increase amount of physical activity.;Long Term: Exercising regularly at least 3-5 days a week. Short Term: Attend rehab on a regular basis to increase amount  of physical activity.;Long Term: Add in home exercise to make exercise part of routine and to increase amount of physical activity.;Long Term: Exercising regularly at least 3-5 days a week.   Increase Strength and Stamina Yes Yes Yes Yes Yes    Intervention Provide advice, education, support and counseling about physical activity/exercise needs.;Develop an individualized exercise prescription for aerobic and resistive training based on initial evaluation findings, risk stratification, comorbidities and participant's personal goals. Provide advice, education, support and counseling about physical activity/exercise needs.;Develop an individualized exercise prescription for aerobic and resistive training based on initial evaluation findings, risk stratification, comorbidities and participant's personal goals. Provide advice, education, support and counseling about physical activity/exercise needs.;Develop an individualized exercise prescription for aerobic and resistive training based on initial evaluation findings, risk stratification, comorbidities and participant's personal goals. Provide advice, education, support and counseling about physical activity/exercise needs.;Develop an individualized exercise prescription for aerobic and resistive training based on initial evaluation findings, risk stratification, comorbidities and participant's personal goals. Provide advice, education, support and counseling about physical activity/exercise needs.;Develop an individualized exercise prescription for aerobic and resistive training based on initial evaluation findings, risk stratification, comorbidities and participant's personal goals.   Expected Outcomes Short Term: Increase workloads from initial exercise prescription for resistance, speed, and METs.;Short Term: Perform resistance training exercises routinely during rehab and add in resistance training at home;Long Term: Improve cardiorespiratory fitness, muscular endurance and strength as measured by increased METs and functional capacity ( ) Short Term: Increase workloads from initial exercise prescription for resistance, speed, and METs.;Short Term: Perform resistance training exercises routinely during  rehab and add in resistance training at home;Long Term: Improve cardiorespiratory fitness, muscular endurance and strength as measured by increased METs and functional capacity ( ) Short Term: Increase workloads from initial exercise prescription for resistance, speed, and METs.;Short Term: Perform resistance training exercises routinely during rehab and add in resistance training at home;Long Term: Improve cardiorespiratory fitness, muscular endurance and strength as measured by increased METs and functional capacity ( ) Short Term: Increase workloads from initial exercise prescription for resistance, speed, and METs.;Short Term: Perform resistance training exercises routinely during rehab and add in resistance training at home;Long Term: Improve cardiorespiratory fitness, muscular endurance and strength as measured by increased METs and functional capacity ( ) Short Term: Increase workloads from initial exercise prescription for resistance, speed, and METs.;Short Term: Perform resistance training exercises routinely during rehab and add in resistance training at home;Long Term: Improve cardiorespiratory fitness, muscular endurance and strength as measured by increased METs and functional capacity ( )   Able to understand and use rate of perceived exertion (RPE) scale Yes Yes Yes Yes Yes   Intervention Provide education and explanation on how to use RPE scale Provide education and explanation on how to use RPE scale Provide education and explanation on how to use RPE scale Provide education and explanation on how to use RPE scale Provide education and explanation on how to use RPE scale   Expected Outcomes Short Term: Able to use RPE daily in rehab to express subjective intensity level;Long Term:  Able to use RPE to guide intensity level when exercising independently Short Term: Able to use RPE daily in rehab to express subjective intensity level;Long Term:  Able to use RPE to guide intensity level  when exercising independently Short Term: Able to use RPE daily in rehab to express subjective intensity level;Long Term:  Able to use RPE to guide intensity level when exercising independently Short Term: Able to use RPE daily in rehab to express subjective intensity  level;Long Term:  Able to use RPE to guide intensity level when exercising independently Short Term: Able to use RPE daily in rehab to express subjective intensity level;Long Term:  Able to use RPE to guide intensity level when exercising independently   Able to understand and use Dyspnea scale Yes Yes Yes Yes Yes   Intervention Provide education and explanation on how to use Dyspnea scale Provide education and explanation on how to use Dyspnea scale Provide education and explanation on how to use Dyspnea scale Provide education and explanation on how to use Dyspnea scale Provide education and explanation on how to use Dyspnea scale   Expected Outcomes Short Term: Able to use Dyspnea scale daily in rehab to express subjective sense of shortness of breath during exertion;Long Term: Able to use Dyspnea scale to guide intensity level when exercising independently Short Term: Able to use Dyspnea scale daily in rehab to express subjective sense of shortness of breath during exertion;Long Term: Able to use Dyspnea scale to guide intensity level when exercising independently Short Term: Able to use Dyspnea scale daily in rehab to express subjective sense of shortness of breath during exertion;Long Term: Able to use Dyspnea scale to guide intensity level when exercising independently Short Term: Able to use Dyspnea scale daily in rehab to express subjective sense of shortness of breath during exertion;Long Term: Able to use Dyspnea scale to guide intensity level when exercising independently Short Term: Able to use Dyspnea scale daily in rehab to express subjective sense of shortness of breath during exertion;Long Term: Able to use Dyspnea scale to guide  intensity level when exercising independently   Knowledge and understanding of Target Heart Rate Range (THRR) Yes Yes Yes Yes Yes   Intervention Provide education and explanation of THRR including how the numbers were predicted and where they are located for reference Provide education and explanation of THRR including how the numbers were predicted and where they are located for reference Provide education and explanation of THRR including how the numbers were predicted and where they are located for reference Provide education and explanation of THRR including how the numbers were predicted and where they are located for reference Provide education and explanation of THRR including how the numbers were predicted and where they are located for reference   Expected Outcomes Short Term: Able to state/look up THRR;Short Term: Able to use daily as guideline for intensity in rehab;Long Term: Able to use THRR to govern intensity when exercising independently Short Term: Able to state/look up THRR;Short Term: Able to use daily as guideline for intensity in rehab;Long Term: Able to use THRR to govern intensity when exercising independently Short Term: Able to state/look up THRR;Short Term: Able to use daily as guideline for intensity in rehab;Long Term: Able to use THRR to govern intensity when exercising independently Short Term: Able to state/look up THRR;Short Term: Able to use daily as guideline for intensity in rehab;Long Term: Able to use THRR to govern intensity when exercising independently Short Term: Able to state/look up THRR;Short Term: Able to use daily as guideline for intensity in rehab;Long Term: Able to use THRR to govern intensity when exercising independently   Understanding of Exercise Prescription Yes Yes Yes Yes Yes   Intervention Provide education, explanation, and written materials on patient's individual exercise prescription Provide education, explanation, and written materials on patient's  individual exercise prescription Provide education, explanation, and written materials on patient's individual exercise prescription Provide education, explanation, and written materials on patient's individual exercise prescription  Provide education, explanation, and written materials on patient's individual exercise prescription   Expected Outcomes Long Term: Able to explain home exercise prescription to exercise independently;Short Term: Able to explain program exercise prescription Long Term: Able to explain home exercise prescription to exercise independently;Short Term: Able to explain program exercise prescription Long Term: Able to explain home exercise prescription to exercise independently;Short Term: Able to explain program exercise prescription Long Term: Able to explain home exercise prescription to exercise independently;Short Term: Able to explain program exercise prescription Long Term: Able to explain home exercise prescription to exercise independently;Short Term: Able to explain program exercise prescription            Exercise Goals Re-Evaluation :  Exercise Goals Re-Evaluation     Row Name 11/03/22 0731 12/01/22 1453 12/29/22 1443 01/26/23 1038       Exercise Goal Re-Evaluation   Exercise Goals Review Increase Physical Activity;Able to understand and use rate of perceived exertion (RPE) scale;Increase Strength and Stamina;Able to understand and use Dyspnea scale;Knowledge and understanding of Target Heart Rate Range (THRR);Understanding of Exercise Prescription Increase Physical Activity;Increase Strength and Stamina;Able to understand and use rate of perceived exertion (RPE) scale;Able to understand and use Dyspnea scale;Knowledge and understanding of Target Heart Rate Range (THRR);Understanding of Exercise Prescription Increase Physical Activity;Increase Strength and Stamina;Able to understand and use rate of perceived exertion (RPE) scale;Able to understand and use Dyspnea  scale;Knowledge and understanding of Target Heart Rate Range (THRR);Understanding of Exercise Prescription Increase Physical Activity;Increase Strength and Stamina;Able to understand and use rate of perceived exertion (RPE) scale;Knowledge and understanding of Target Heart Rate Range (THRR);Able to check pulse independently;Understanding of Exercise Prescription    Comments Pt has not attended a PR session yet. Her daughter reports that she has pneumonia, so she has missed her first two weeks. She is hopeful to begin 4/2. Unable to assess at this time due to lack of attendance. Pt has completed 2 sessions of PR. She is deconditioned and will progress slowly in  the program. She is currently exercising at 1.8 METs on  the stepper. Will continue to monitor and progress as able., Pt has completed 5 sessions of PR. She is deconditioned and will progress slowly. She has started to increase her SPM on the stepper which is a small goal. She is currently exercising at 1.8 METs on the stepper. Will continue to monitor and progress as able . Pt has completed 9 sessions of PR. SHe continues to be deconditioned but has started to progress by increasing her level on the stepper. She is still has low SPM. She sometimes misses classes due to transpertation. She is currenttly exercising at 1.7 METs on the stepper. Will continue to monitor and progress as able.    Expected Outcomes Through exercise at rehab and at home, the patient will meet their stated goals. Through exercise at rehab and at home, the patient will meet their stated goals. Through exercise at rehab and at home, the patient will meet their stated goals. Through exercise at rehab and at home, the patient will meet their stated goals.             Discharge Exercise Prescription (Final Exercise Prescription Changes):  Exercise Prescription Changes - 01/25/23 1500       Response to Exercise   Blood Pressure (Admit) 120/68    Blood Pressure (Exercise)  124/74    Blood Pressure (Exit) 124/64    Heart Rate (Admit) 76 bpm    Heart Rate (Exercise) 82 bpm  Heart Rate (Exit) 76 bpm    Oxygen Saturation (Admit) 95 %    Oxygen Saturation (Exercise) 93 %    Oxygen Saturation (Exit) 94 %    Rating of Perceived Exertion (Exercise) 12    Perceived Dyspnea (Exercise) 12    Duration Continue with 30 min of aerobic exercise without signs/symptoms of physical distress.    Intensity THRR unchanged      Progression   Progression Continue to progress workloads to maintain intensity without signs/symptoms of physical distress.      Resistance Training   Training Prescription Yes    Weight 2    Reps 10-15    Time 10 Minutes      NuStep   Level 2    SPM 48    Minutes 39    METs 1.7             Nutrition:  Target Goals: Understanding of nutrition guidelines, daily intake of sodium 1500mg , cholesterol 200mg , calories 30% from fat and 7% or less from saturated fats, daily to have 5 or more servings of fruits and vegetables.  Biometrics:  Pre Biometrics - 10/19/22 1450       Pre Biometrics   Height 5' (1.524 m)    Weight 78 kg    Waist Circumference 44.5 inches    Hip Circumference 45.5 inches    Waist to Hip Ratio 0.98 %    BMI (Calculated) 33.58    Triceps Skinfold 43 mm    % Body Fat 49.1 %    Grip Strength 43 kg    Flexibility 0 in    Single Leg Stand 0 seconds              Nutrition Therapy Plan and Nutrition Goals:  Nutrition Therapy & Goals - 10/20/22 0737       Personal Nutrition Goals   Comments Patient scored 45 on diet assessment. We provide educational material on heart healthy nutrition with handouts and assistance with RD referral if patient is interested.      Intervention Plan   Intervention Nutrition handout(s) given to patient.             Nutrition Assessments:  Nutrition Assessments - 10/19/22 1315       MEDFICTS Scores   Pre Score 45            MEDIFICTS Score Key: ?70 Need to  make dietary changes  40-70 Heart Healthy Diet ? 40 Therapeutic Level Cholesterol Diet   Picture Your Plate Scores: <16 Unhealthy dietary pattern with much room for improvement. 41-50 Dietary pattern unlikely to meet recommendations for good health and room for improvement. 51-60 More healthful dietary pattern, with some room for improvement.  >60 Healthy dietary pattern, although there may be some specific behaviors that could be improved.    Nutrition Goals Re-Evaluation:   Nutrition Goals Discharge (Final Nutrition Goals Re-Evaluation):   Psychosocial: Target Goals: Acknowledge presence or absence of significant depression and/or stress, maximize coping skills, provide positive support system. Participant is able to verbalize types and ability to use techniques and skills needed for reducing stress and depression.  Initial Review & Psychosocial Screening:  Initial Psych Review & Screening - 10/19/22 1301       Initial Review   Current issues with Current Depression;Current Sleep Concerns      Family Dynamics   Good Support System? Yes    Comments Her son, daugher, son in law, grandson, and sister support her.  Barriers   Psychosocial barriers to participate in program There are no identifiable barriers or psychosocial needs.      Screening Interventions   Interventions Encouraged to exercise;Provide feedback about the scores to participant    Expected Outcomes Long Term goal: The participant improves quality of Life and PHQ9 Scores as seen by post scores and/or verbalization of changes;Short Term goal: Identification and review with participant of any Quality of Life or Depression concerns found by scoring the questionnaire.             Quality of Life Scores:  Quality of Life - 10/19/22 1432       Quality of Life   Select Quality of Life      Quality of Life Scores   Health/Function Pre 13.13 %    Socioeconomic Pre 26.92 %    Psych/Spiritual Pre 26.57 %     Family Pre 30 %    GLOBAL Pre 20.77 %            Scores of 19 and below usually indicate a poorer quality of life in these areas.  A difference of  2-3 points is a clinically meaningful difference.  A difference of 2-3 points in the total score of the Quality of Life Index has been associated with significant improvement in overall quality of life, self-image, physical symptoms, and general health in studies assessing change in quality of life.   PHQ-9: Review Flowsheet       10/19/2022  Depression screen PHQ 2/9  Decreased Interest 1  Down, Depressed, Hopeless 1  PHQ - 2 Score 2  Altered sleeping 3  Tired, decreased energy 3  Change in appetite 1  Feeling bad or failure about yourself  1  Trouble concentrating 1  Moving slowly or fidgety/restless 0  Suicidal thoughts 0  PHQ-9 Score 11  Difficult doing work/chores Somewhat difficult   Interpretation of Total Score  Total Score Depression Severity:  1-4 = Minimal depression, 5-9 = Mild depression, 10-14 = Moderate depression, 15-19 = Moderately severe depression, 20-27 = Severe depression   Psychosocial Evaluation and Intervention:  Psychosocial Evaluation - 10/19/22 1454       Psychosocial Evaluation & Interventions   Interventions Stress management education;Relaxation education;Encouraged to exercise with the program and follow exercise prescription    Comments Pt has no barriers to participate in PR. She has no identifiable psychosocial issues. She does have depression, but she states that it is well managed with buspirone 20 mg twice daily and citalopram 20 mg once daily. Her husband passed away back in 10-Jan-2022, but she reports that she has been able to cope well with this. She is with her daughter, Joni Reining, today. Joni Reining is her primary caregiver and takes her to all of her appointments. Pt also reports problems with her sleep, but she does not take anything for her sleep. She scored a 14 on her PHQ-9, and most of  this relates to her depression and the effects of her COPD. She reports not being able to go out much due to her SOB. Outside of her daughter, she also lists her son, son in law, grandson, and sister as her support system. Her goals while in the program are to decrease her SOB with exertion and to gain her confidence back. She would like to be more confident that she can do activities such as shopping or walking up stairs without becoming SOB. She is eager to start the program.    Expected Outcomes  Pt's depression will continue to be treated, and she will have no other identifiable psychosocial issues.    Continue Psychosocial Services  No Follow up required             Psychosocial Re-Evaluation:  Psychosocial Re-Evaluation     Row Name 10/25/22 1610 11/19/22 1339 12/20/22 1431 01/17/23 1301       Psychosocial Re-Evaluation   Current issues with History of Depression;None Identified;Current Psychotropic Meds History of Depression;None Identified;Current Psychotropic Meds History of Depression;Current Psychotropic Meds History of Depression;Current Psychotropic Meds    Comments Patient has not started class yet, plans to this week.  Her initial PHQ-7 score is 14 and QOL score is 20.77%.  Patient was referred to PR with COPD via Dr. Renaldo Reel.  Her personal goals are to decrease her SOB with exertion, go up stairs without SOB and gain confidence to go back shopping. Patient plans to start soon.  Her initial PHQ-7 score is 14 and QOL score is 20.77%.  Patient was referred to PR with COPD via Dr. Renaldo Reel.  Her personal goals are to decrease her SOB with exertion, go up stairs without SOB and gain confidence to go back shopping. Patient has started with attending only 3 sessions.  Her attendance is limited due to transportation problems.  Patient was referred to PR with COPD via Dr. Renaldo Reel.  She reports having a history of depression that is managed with Buspar and citalopram.  She seems to enjoy coming to  class and interactive with others in class. We will contiue to monitor her as she progresses in the program. Patient has completed 7th sessions.  Her attendance is limited due to transportation problems.  Patient was referred to PR with COPD via Dr. Renaldo Reel.  She reports having a history of depression that is managed with Buspar and citalopram.  She seems to enjoy coming to class.  She continues to demonstrate a positive outlook and interactive with class and staff. We will contiue to monitor her as she progresses in the program.    Expected Outcomes Patient will have no psychosocial issuses identified at discharge. Patient will have no psychosocial issuses identified at discharge. Patient will have no psychosocial issuses identified at discharge. Patient will have no psychosocial issuses identified at discharge.    Interventions Stress management education;Relaxation education;Encouraged to attend Pulmonary Rehabilitation for the exercise Stress management education;Relaxation education;Encouraged to attend Pulmonary Rehabilitation for the exercise Stress management education;Relaxation education;Encouraged to attend Pulmonary Rehabilitation for the exercise Stress management education;Relaxation education;Encouraged to attend Pulmonary Rehabilitation for the exercise    Continue Psychosocial Services  No Follow up required No Follow up required No Follow up required No Follow up required             Psychosocial Discharge (Final Psychosocial Re-Evaluation):  Psychosocial Re-Evaluation - 01/17/23 1301       Psychosocial Re-Evaluation   Current issues with History of Depression;Current Psychotropic Meds    Comments Patient has completed 7th sessions.  Her attendance is limited due to transportation problems.  Patient was referred to PR with COPD via Dr. Renaldo Reel.  She reports having a history of depression that is managed with Buspar and citalopram.  She seems to enjoy coming to class.  She continues to  demonstrate a positive outlook and interactive with class and staff. We will contiue to monitor her as she progresses in the program.    Expected Outcomes Patient will have no psychosocial issuses identified at discharge.    Interventions Stress management  education;Relaxation education;Encouraged to attend Pulmonary Rehabilitation for the exercise    Continue Psychosocial Services  No Follow up required              Education: Education Goals: Education classes will be provided on a weekly basis, covering required topics. Participant will state understanding/return demonstration of topics presented.  Learning Barriers/Preferences:  Learning Barriers/Preferences - 10/19/22 1326       Learning Barriers/Preferences   Learning Barriers None    Learning Preferences Written Material             Education Topics: How Lungs Work and Diseases: - Discuss the anatomy of the lungs and diseases that can affect the lungs, such as COPD.   Exercise: -Discuss the importance of exercise, FITT principles of exercise, normal and abnormal responses to exercise, and how to exercise safely.   Environmental Irritants: -Discuss types of environmental irritants and how to limit exposure to environmental irritants.   Meds/Inhalers and oxygen: - Discuss respiratory medications, definition of an inhaler and oxygen, and the proper way to use an inhaler and oxygen. Flowsheet Row PULMONARY REHAB OTHER RESPIRATORY from 12/23/2022 in [place] [facility]  Date 11/25/22  Educator handout       Energy Saving Techniques: - Discuss methods to conserve energy and decrease shortness of breath when performing activities of daily living.  Flowsheet Row PULMONARY REHAB OTHER RESPIRATORY from 12/23/2022 in [place] [facility]  Date 12/02/22  Educator handout       Bronchial Hygiene / Breathing Techniques: - Discuss breathing mechanics, pursed-lip breathing technique,   proper posture, effective ways to clear airways, and other functional breathing techniques   Cleaning Equipment: - Provides group verbal and written instruction about the health risks of elevated stress, cause of high stress, and healthy ways to reduce stress.   Nutrition I: Fats: - Discuss the types of cholesterol, what cholesterol does to the body, and how cholesterol levels can be controlled. Flowsheet Row PULMONARY REHAB OTHER RESPIRATORY from 12/23/2022 in [place] [facility]  Date 12/23/22  Educator handout       Nutrition II: Labels: -Discuss the different components of food labels and how to read food labels.   Respiratory Infections: - Discuss the signs and symptoms of respiratory infections, ways to prevent respiratory infections, and the importance of seeking medical treatment when having a respiratory infection.   Stress I: Signs and Symptoms: - Discuss the causes of stress, how stress may lead to anxiety and depression, and ways to limit stress.   Stress II: Relaxation: -Discuss relaxation techniques to limit stress.   Oxygen for Home/Travel: - Discuss how to prepare for travel when on oxygen and proper ways to transport and store oxygen to ensure safety.   Knowledge Questionnaire Score:  Knowledge Questionnaire Score - 10/19/22 1327       Knowledge Questionnaire Score   Pre Score 0/18, did not complete, but reviewed all correct answers with patient             Core Components/Risk Factors/Patient Goals at Admission:  Personal Goals and Risk Factors at Admission - 10/19/22 1336       Core Components/Risk Factors/Patient Goals on Admission   Improve shortness of breath with ADL's Yes    Intervention Provide education, individualized exercise plan and daily activity instruction to help decrease symptoms of SOB with activities of daily living.    Expected Outcomes Short Term: Improve cardiorespiratory fitness to achieve a reduction of  symptoms when performing ADLs;Long Term:  Be able to perform more ADLs without symptoms or delay the onset of symptoms    Increase knowledge of respiratory medications and ability to use respiratory devices properly  Yes    Intervention Provide education and demonstration as needed of appropriate use of medications, inhalers, and oxygen therapy.    Expected Outcomes Short Term: Achieves understanding of medications use. Understands that oxygen is a medication prescribed by physician. Demonstrates appropriate use of inhaler and oxygen therapy.;Long Term: Maintain appropriate use of medications, inhalers, and oxygen therapy.    Diabetes Yes    Intervention Provide education about signs/symptoms and action to take for hypo/hyperglycemia.;Provide education about proper nutrition, including hydration, and aerobic/resistive exercise prescription along with prescribed medications to achieve blood glucose in normal ranges: Fasting glucose 65-99 mg/dL    Expected Outcomes Short Term: Participant verbalizes understanding of the signs/symptoms and immediate care of hyper/hypoglycemia, proper foot care and importance of medication, aerobic/resistive exercise and nutrition plan for blood glucose control.;Long Term: Attainment of HbA1C < 7%.    Personal Goal Other Yes    Personal Goal Increase confidence in doing things for herself and going out more.    Intervention Attend PR two days per week and begin a home exercise program.    Expected Outcomes Pt will meet stated goals.             Core Components/Risk Factors/Patient Goals Review:   Goals and Risk Factor Review     Row Name 10/25/22 0941 11/19/22 1342 12/20/22 1443 01/17/23 1307       Core Components/Risk Factors/Patient Goals Review   Personal Goals Review Improve shortness of breath with ADL's;Develop more efficient breathing techniques such as purse lipped breathing and diaphragmatic breathing and practicing self-pacing with activity.;Other  Improve shortness of breath with ADL's;Develop more efficient breathing techniques such as purse lipped breathing and diaphragmatic breathing and practicing self-pacing with activity.;Other Improve shortness of breath with ADL's;Develop more efficient breathing techniques such as purse lipped breathing and diaphragmatic breathing and practicing self-pacing with activity.;Other Improve shortness of breath with ADL's;Develop more efficient breathing techniques such as purse lipped breathing and diaphragmatic breathing and practicing self-pacing with activity.;Other    Review Patient plans to start program soon.  Patient referred to PR with COPD via Dr. Renaldo Reel.  Her personal goals for the program is to decrease SOB with exertion, go up stairs without SOB and gain confidence to go back shopping without SOB.  We will continue to monitor her progress as she works toward meeting these goals. Patient plans to start program soon.  Patient referred to PR with COPD via Dr. Renaldo Reel.  Her personal goals for the program is to decrease SOB with exertion, go up stairs without SOB and gain confidence to go back shopping without SOB.  We will continue to monitor her progress as she works toward meeting these goals. Patient has started program with only attending 3 sessions. Her low attendance is due to transportation problems.  Her personal goals for the program is to decrease SOB with exertion, go up stairs without SOB and gain confidence to go back shopping without SOB.  We will help her develpe a efficient breathing technique, such as purse lipped breathing and practice self pacing with increased activity.  We will continue to monitor her progress as she works toward meeting these goals. Patient has completed 7th sessions. Her low attendance is due to transportation problems.  Her personal goals for the program is to decrease SOB with exertion, go up stairs without  SOB and gain confidence to go back shopping without SOB.  We will  help her develope a efficient breathing technique, such as purse lipped breathing and practice self pacing with increased activity. Her O2 sats average between 90%-94% without  any supplemental oxygen needed at  this time.  We will continue to monitor her progress as she works toward meeting these goals.    Expected Outcomes Patient will complete the program meeting both program and personal goals. Patient will complete the program meeting both program and personal goals. Patient will complete the program meeting both program and personal goals. Patient will complete the program meeting both program and personal goals.             Core Components/Risk Factors/Patient Goals at Discharge (Final Review):   Goals and Risk Factor Review - 01/17/23 1307       Core Components/Risk Factors/Patient Goals Review   Personal Goals Review Improve shortness of breath with ADL's;Develop more efficient breathing techniques such as purse lipped breathing and diaphragmatic breathing and practicing self-pacing with activity.;Other    Review Patient has completed 7th sessions. Her low attendance is due to transportation problems.  Her personal goals for the program is to decrease SOB with exertion, go up stairs without SOB and gain confidence to go back shopping without SOB.  We will help her develope a efficient breathing technique, such as purse lipped breathing and practice self pacing with increased activity. Her O2 sats average between 90%-94% without  any supplemental oxygen needed at  this time.  We will continue to monitor her progress as she works toward meeting these goals.    Expected Outcomes Patient will complete the program meeting both program and personal goals.             ITP Comments:   Comments: ITP REVIEW Pt is making expected progress toward pulmonary rehab goals after completing 10 sessions. Recommend continued exercise, life style modification, education, and utilization of breathing  techniques to increase stamina and strength and decrease shortness of breath with exertion.

## 2023-01-27 ENCOUNTER — Other Ambulatory Visit: Payer: Self-pay

## 2023-01-27 ENCOUNTER — Encounter (HOSPITAL_COMMUNITY)
Admission: RE | Admit: 2023-01-27 | Discharge: 2023-01-27 | Disposition: A | Discharge status: Home / Self Care | Payer: Medicare Other | Source: Ambulatory Visit | Attending: Critical Care Medicine | Admitting: Critical Care Medicine

## 2023-01-27 DIAGNOSIS — J449 Chronic obstructive pulmonary disease, unspecified: Secondary | ICD-10-CM

## 2023-01-27 DIAGNOSIS — E1165 Type 2 diabetes mellitus with hyperglycemia: Secondary | ICD-10-CM

## 2023-01-27 MED ORDER — TRULICITY 4.5 MG/0.5ML ~~LOC~~ SOAJ
4.5000 mg | SUBCUTANEOUS | 2 refills | Status: DC
Start: 2023-01-27 — End: 2023-05-10

## 2023-01-27 NOTE — Progress Notes (Signed)
Daily Session Note  Patient Details  Name: Kimberly Graves MRN: [id number] Date of Birth: 1951/04/01 Referring Provider:   Flowsheet Row PULMONARY REHAB COPD ORIENTATION from 10/19/2022 in [facility] CARDIAC REHABILITATION  Referring Provider Dr. Maia Plan       Encounter Date: 01/27/2023  Check In:  Session Check In - 01/27/23 1500       Check-In   Supervising physician immediately available to respond to emergencies [facility] MD immediately available    Physician(s) Dr Diona Browner    Location [facility]    Staff Present Ross Ludwig, BS, Exercise Physiologist;Twylah Bennetts Daphine Deutscher, RN, BSN    Virtual Visit No    Medication changes reported     No    Fall or balance concerns reported    Yes    Comments She has fallen several times over the past year and she uses a walker.    Tobacco Cessation No Change    Warm-up and Cool-down Performed as group-led instruction    Resistance Training Performed Yes      Pain Assessment   Currently in Pain? No/denies    Pain Score 0-No pain             Capillary Blood Glucose: No results found for this or any previous visit (from the past 24 hour(s)).    Social History   Tobacco Use  Smoking Status Former   Types: Cigarettes   Quit date: 2007   Years since quitting: 17.4   Passive exposure: Past  Smokeless Tobacco Not on file    Goals Met:  Proper associated with RPD/PD & O2 Sat Independence with exercise equipment Improved SOB with ADL's Exercise tolerated well Queuing for purse lip breathing No report of concerns or symptoms today Strength training completed today  Goals Unmet:  Not Applicable  Comments: Checkout at 1600.   Dr. Erick Blinks is Medical Director for [facility] Pulmonary Rehab.

## 2023-02-01 ENCOUNTER — Encounter (HOSPITAL_COMMUNITY): Payer: Medicare Other

## 2023-02-03 ENCOUNTER — Encounter (HOSPITAL_COMMUNITY)
Admission: RE | Admit: 2023-02-03 | Discharge: 2023-02-03 | Disposition: A | Discharge status: Home / Self Care | Payer: Medicare Other | Source: Ambulatory Visit | Attending: Critical Care Medicine | Admitting: Critical Care Medicine

## 2023-02-03 DIAGNOSIS — J449 Chronic obstructive pulmonary disease, unspecified: Secondary | ICD-10-CM | POA: Diagnosis not present

## 2023-02-03 NOTE — Progress Notes (Signed)
Daily Session Note  Patient Details  Name: Kimberly Graves MRN: [id number] Date of Birth: 1950-11-21 Referring Provider:   Flowsheet Row PULMONARY REHAB COPD ORIENTATION from 10/19/2022 in [facility] CARDIAC REHABILITATION  Referring Provider Dr. Maia Plan       Encounter Date: 02/03/2023  Check In:  Session Check In - 02/03/23 1458       Check-In   Supervising physician immediately available to respond to emergencies [facility] immediately available    Physician(s) Dr Jenene Slicker    Location [facility]    Staff Present Ross Ludwig, BS, Exercise Physiologist;Kedar Sedano Daphine Deutscher, RN, BSN    Virtual Visit No    Medication changes reported     No    Fall or balance concerns reported    Yes    Comments She has fallen several times over the past year and she uses a walker.    Tobacco Cessation No Change    Warm-up and Cool-down Performed as group-led instruction    Resistance Training Performed Yes      Pain Assessment   Currently in Pain? No/denies    Pain Score 0-No pain             Capillary Blood Glucose: No results found for this or any previous visit (from the past 24 hour(s)).    Social History   Tobacco Use  Smoking Status Former   Types: Cigarettes   Quit date: 2007   Years since quitting: 17.4   Passive exposure: Past  Smokeless Tobacco Not on file    Goals Met:  Proper associated with RPD/PD & O2 Sat Independence with exercise equipment Using PLB without cueing & demonstrates good technique Exercise tolerated well Queuing for purse lip breathing No report of concerns or symptoms today Strength training completed today  Goals Unmet:  Not Applicable  Comments: Checkout at 1600.   Dr. Erick Blinks is Medical Director for [facility] Pulmonary Rehab.

## 2023-02-04 ENCOUNTER — Other Ambulatory Visit: Payer: Self-pay

## 2023-02-04 MED ORDER — TRULICITY 1.5 MG/0.5ML ~~LOC~~ SOAJ: 1.5000 mg | SUBCUTANEOUS | 2 refills | Status: DC

## 2023-02-08 ENCOUNTER — Encounter (HOSPITAL_COMMUNITY)
Admission: RE | Admit: 2023-02-08 | Discharge: 2023-02-08 | Disposition: A | Discharge status: Home / Self Care | Payer: Medicare Other | Source: Ambulatory Visit | Attending: Critical Care Medicine | Admitting: Critical Care Medicine

## 2023-02-08 DIAGNOSIS — J449 Chronic obstructive pulmonary disease, unspecified: Secondary | ICD-10-CM | POA: Insufficient documentation

## 2023-02-08 NOTE — Progress Notes (Signed)
Daily Session Note  Patient Details  Name: Kimberly Graves MRN: [id number] Date of Birth: Jul 27, 1951 Referring Provider:   Flowsheet Row PULMONARY REHAB COPD ORIENTATION from 10/19/2022 in [facility] CARDIAC REHABILITATION  Referring Provider Dr. Maia Plan       Encounter Date: 02/08/2023  Check In:  Session Check In - 02/08/23 1500       Check-In   Supervising physician immediately available to respond to emergencies [facility] immediately available    Physician(s) Dr. Wyline Mood    Location [facility]    Staff Present Other;Denaisha Swango, RN;Heather Fredric Mare, BS, Exercise Physiologist;Debra Laural Benes, RN, BSN    Virtual Visit No    Medication changes reported     No    Fall or balance concerns reported    Yes    Comments She has fallen several times over the past year and she uses a walker.    Tobacco Cessation No Change    Warm-up and Cool-down Performed as group-led instruction    Resistance Training Performed Yes    VAD Patient? No    PAD/SET Patient? No      Pain Assessment   Currently in Pain? No/denies    Pain Score 0-No pain    Multiple Pain Sites No             Capillary Blood Glucose: No results found for this or any previous visit (from the past 24 hour(s)).    Social History   Tobacco Use  Smoking Status Former   Types: Cigarettes   Quit date: 2007   Years since quitting: 17.5   Passive exposure: Past  Smokeless Tobacco Not on file    Goals Met:  Independence with exercise equipment Using PLB without cueing & demonstrates good technique Exercise tolerated well No report of concerns or symptoms today Strength training completed today  Goals Unmet:  Not Applicable  Comments: check out @ 4:00pm   Dr. Erick Blinks is Medical Director for [facility] Pulmonary Rehab.

## 2023-02-10 ENCOUNTER — Encounter (HOSPITAL_COMMUNITY): Payer: Medicare Other

## 2023-02-15 ENCOUNTER — Encounter (HOSPITAL_COMMUNITY)
Admission: RE | Admit: 2023-02-15 | Discharge: 2023-02-15 | Disposition: A | Discharge status: Home / Self Care | Payer: Medicare Other | Source: Ambulatory Visit | Attending: Critical Care Medicine | Admitting: Critical Care Medicine

## 2023-02-15 DIAGNOSIS — J449 Chronic obstructive pulmonary disease, unspecified: Secondary | ICD-10-CM

## 2023-02-15 NOTE — Progress Notes (Signed)
Daily Session Note  Patient Details  Name: Kimberly Graves MRN: [id number] Date of Birth: 10/01/1950 Referring Provider:   Flowsheet Row PULMONARY REHAB COPD ORIENTATION from 10/19/2022 in [facility] CARDIAC REHABILITATION  Referring Provider Dr. Maia Plan       Encounter Date: 02/15/2023  Check In:  Session Check In - 02/15/23 1500       Check-In   Supervising physician immediately available to respond to emergencies [facility] immediately available    Physician(s) DrEden Emms    Location [facility]    Staff Present Ross Ludwig, BS, Exercise Physiologist;Katurah Karapetian, RN;Jessica Hawkins, MA, RCEP, CCRP, CCET    Virtual Visit No    Medication changes reported     No    Fall or balance concerns reported    Yes    Comments She has fallen several times over the past year and she uses a walker.    Tobacco Cessation No Change    Warm-up and Cool-down Performed as group-led instruction    Resistance Training Performed Yes    VAD Patient? No    PAD/SET Patient? No      Pain Assessment   Currently in Pain? No/denies    Pain Score 0-No pain    Multiple Pain Sites No             Capillary Blood Glucose: No results found for this or any previous visit (from the past 24 hour(s)).    Social History   Tobacco Use  Smoking Status Former   Types: Cigarettes   Quit date: 2007   Years since quitting: 17.5   Passive exposure: Past  Smokeless Tobacco Not on file    Goals Met:  Proper associated with RPD/PD & O2 Sat Independence with exercise equipment Using PLB without cueing & demonstrates good technique Exercise tolerated well No report of concerns or symptoms today Strength training completed today  Goals Unmet:  Not Applicable  Comments: Pt able to follow exercise prescription today without complaint.  Will continue to monitor for progression.    Dr. Erick Blinks is Medical Director for [facility] Pulmonary Rehab.

## 2023-02-17 ENCOUNTER — Encounter (HOSPITAL_COMMUNITY)
Admission: RE | Admit: 2023-02-17 | Discharge: 2023-02-17 | Disposition: A | Discharge status: Home / Self Care | Payer: Medicare Other | Source: Ambulatory Visit | Attending: Critical Care Medicine | Admitting: Critical Care Medicine

## 2023-02-17 DIAGNOSIS — J449 Chronic obstructive pulmonary disease, unspecified: Secondary | ICD-10-CM

## 2023-02-17 NOTE — Progress Notes (Signed)
Daily Session Note  Patient Details  Name: Cache Decoursey MRN: [id number] Date of Birth: [birth year] Referring Provider:   Flowsheet Row PULMONARY REHAB COPD ORIENTATION from 10/19/2022 in [facility] CARDIAC REHABILITATION  Referring Provider Dr. Maia Plan       Encounter Date: 02/17/2023  Check In:  Session Check In - 02/17/23 1500       Check-In   Supervising physician immediately available to respond to emergencies [facility] MD immediately available    Physician(s) Dr. Diona Browner    Location [facility]    Staff Present Avanell Shackleton BSN, RN;Debra Laural Benes, RN, BSN    Virtual Visit No    Medication changes reported     No    Fall or balance concerns reported    Yes    Comments She has fallen several times over the past year and she uses a walker.    Tobacco Cessation No Change    Warm-up and Cool-down Performed on first and last piece of equipment    Resistance Training Performed Yes    VAD Patient? No    PAD/SET Patient? No      Pain Assessment   Currently in Pain? No/denies    Pain Score 0-No pain    Multiple Pain Sites No             Capillary Blood Glucose: No results found for this or any previous visit (from the past 24 hour(s)).    Social History   Tobacco Use  Smoking Status Former   Current packs/day: 0.00   Types: Cigarettes   Quit date: 2007   Years since quitting: 17.5   Passive exposure: Past  Smokeless Tobacco Not on file    Goals Met:  Proper associated with RPD/PD & O2 Sat Independence with exercise equipment Using PLB without cueing & demonstrates good technique Exercise tolerated well Queuing for purse lip breathing No report of concerns or symptoms today Strength training completed today  Goals Unmet:  Not Applicable  Comments: Marland KitchenMarland KitchenPt able to follow exercise prescription today without complaint.  Will continue to monitor for progression.     Dr. Erick Blinks is Medical Director for [facility] Pulmonary  Rehab.

## 2023-02-22 ENCOUNTER — Encounter (HOSPITAL_COMMUNITY): Payer: Medicare Other

## 2023-02-23 ENCOUNTER — Encounter (HOSPITAL_COMMUNITY): Payer: Self-pay | Admitting: *Deleted

## 2023-02-23 DIAGNOSIS — J449 Chronic obstructive pulmonary disease, unspecified: Secondary | ICD-10-CM

## 2023-02-23 NOTE — Progress Notes (Signed)
Pulmonary Individual Treatment Plan  Patient Details  Name: Kimberly Graves MRN: [id number] Date of Birth: Feb 09, 1951 Referring Provider:   Flowsheet Row PULMONARY REHAB COPD ORIENTATION from 10/19/2022 in [facility] CARDIAC REHABILITATION  Referring Provider Dr. Maia Plan       Initial Encounter Date:  Flowsheet Row PULMONARY REHAB COPD ORIENTATION from 10/19/2022 in [facility] CARDIAC REHABILITATION  Date 10/19/22       Visit Diagnosis: Chronic obstructive pulmonary disease, unspecified COPD type (HCC)  Patient's Home Medications on Admission:   Current Outpatient Medications:    acetaminophen (TYLENOL) 500 MG tablet, Take 1,000 mg by mouth daily., Disp: , Rfl:    albuterol (PROVENTIL) (2.5 MG/3ML) 0.083% nebulizer solution, Take 2.5 mg by nebulization every 6 (six) hours as needed for shortness of breath or wheezing., Disp: , Rfl:    albuterol (VENTOLIN HFA) 108 (90 Base) MCG/ACT inhaler, Inhale 2 puffs into the lungs every 6 (six) hours as needed for shortness of breath., Disp: , Rfl:    aspirin 81 MG chewable tablet, Chew 81 mg by mouth at bedtime., Disp: , Rfl:    atorvastatin (LIPITOR) 80 MG tablet, Take 1 tablet by mouth daily., Disp: , Rfl:    Blood Glucose Monitoring Suppl (ACCU-CHEK GUIDE ME) w/Device KIT, 1 Piece by Does not apply route as directed., Disp: 1 kit, Rfl: 0   busPIRone (BUSPAR) 10 MG tablet, Take 10 mg by mouth 2 (two) times daily., Disp: , Rfl:    carvedilol (COREG) 3.125 MG tablet, Take 3.125 mg by mouth 2 (two) times daily with a meal., Disp: , Rfl:    cetirizine (ZYRTEC) 10 MG tablet, Take 10 mg by mouth daily., Disp: , Rfl:    citalopram (CELEXA) 20 MG tablet, Take 20 mg by mouth daily., Disp: , Rfl:    Cranberry 200 MG CAPS, Take 200 mg by mouth at bedtime., Disp: , Rfl:    Dulaglutide (TRULICITY) 1.5 MG/0.5ML SOPN, Inject 1.5 mg into the skin once a week., Disp: 2 mL, Rfl: 2   Dulaglutide (TRULICITY) 4.5 MG/0.5ML SOPN, Inject 4.5 mg as directed  once a week., Disp: 2 mL, Rfl: 2   empagliflozin (JARDIANCE) 10 MG TABS tablet, Take 1 tablet (10 mg total) by mouth daily before breakfast., Disp: 90 tablet, Rfl: 1   fenofibrate (TRICOR) 145 MG tablet, Take 290 mg by mouth daily., Disp: , Rfl:    glucose blood (ACCU-CHEK GUIDE) test strip, Use as instructed, Disp: 100 each, Rfl: 2   ibuprofen (ADVIL) 200 MG tablet, Take 400 mg by mouth at bedtime., Disp: , Rfl:    Insulin Pen Needle (B-D ULTRAFINE III SHORT PEN) 31G X 8 MM MISC, 1 each by Does not apply route as directed., Disp: 50 each, Rfl: 1   levETIRAcetam (KEPPRA) 500 MG tablet, Take 500 mg by mouth 2 (two) times daily., Disp: , Rfl:    lisinopril (ZESTRIL) 5 MG tablet, Take 1 tablet (5 mg total) by mouth daily., Disp: , Rfl:    loperamide (IMODIUM A-D) 2 MG tablet, Take 2 mg by mouth 4 (four) times daily as needed for diarrhea or loose stools., Disp: , Rfl:    Multiple Vitamin (MULTIVITAMIN) capsule, Take 1 capsule by mouth daily., Disp: , Rfl:    omeprazole (PRILOSEC) 40 MG capsule, Take 40 mg by mouth daily before breakfast., Disp: , Rfl:    pregabalin (LYRICA) 75 MG capsule, Take 75 mg by mouth 2 (two) times daily., Disp: , Rfl:    TRELEGY ELLIPTA 200-62.5-25 MCG/ACT  AEPB, Inhale 1 puff into the lungs daily., Disp: , Rfl:    trolamine salicylate (ASPERCREME) 10 % cream, Apply 1 Application topically as needed for muscle pain., Disp: , Rfl:    VASCEPA 1 g capsule, TAKE ONE CAPSULE BY MOUTH TWICE DAILY WITH FOOD, Disp: 60 capsule, Rfl: 5  Past Medical History: Past Medical History:  Diagnosis Date   COPD (chronic obstructive pulmonary disease) (HCC)    Diabetes mellitus, type II (HCC)    Hypertension    Myocardial infarction (HCC) 2020   Neuromuscular disorder (HCC)    Rectal cancer (HCC)    Small cell carcinoma (HCC) 2007    Tobacco Use: Social History   Tobacco Use  Smoking Status Former   Current packs/day: 0.00   Types: Cigarettes   Quit date: 2007   Years since  quitting: 17.5   Passive exposure: Past  Smokeless Tobacco Not on file    Labs: Review Flowsheet  More data exists      Latest Ref Rng & Units 10/24/2021 03/31/2022 04/27/2022 08/12/2022 01/11/2023  Labs for ITP Cardiac and Pulmonary Rehab  Cholestrol 100 - 199 mg/dL - - 161     - 096   LDL (calc) 0 - 99 mg/dL - - - - 90   HDL-C >04 mg/dL - - 31     - 28   Trlycerides 0 - 149 mg/dL - - 540     - 981   Hemoglobin A1c 0.0 - 7.0 % 6.7  8.1  - 7.7  -    Details       This result is from an external source.         Capillary Blood Glucose: Lab Results  Component Value Date   GLUCAP 173 (H) 12/16/2022   GLUCAP 236 (H) 12/02/2022   GLUCAP 230 (H) 11/25/2022   GLUCAP 247 (H) 11/23/2022   GLUCAP 168 (H) 10/25/2021    POCT Glucose     Row Name 10/19/22 1340             POCT Blood Glucose   Pre-Exercise 149 mg/dL                Pulmonary Assessment Scores:  Pulmonary Assessment Scores     Row Name 10/19/22 1255         ADL UCSD   ADL Phase Entry     SOB Score total 88     Rest 2     Walk 4     Stairs 5     Bath 4     Dress 3     Shop 3       CAT Score   CAT Score 33       mMRC Score   mMRC Score 4             UCSD: Self-administered rating of dyspnea associated with activities of daily living (ADLs) 6-point scale (0 = "not at all" to 5 = "maximal or unable to do because of breathlessness")  Scoring Scores range from 0 to 120.  Minimally important difference is 5 units  CAT: CAT can identify the health impairment of COPD patients and is better correlated with disease progression.  CAT has a scoring range of zero to 40. The CAT score is classified into four groups of low (less than 10), medium (10 - 20), high (21-30) and very high (31-40) based on the impact level of disease on health status. A CAT score over 10 suggests significant  symptoms.  A worsening CAT score could be explained by an exacerbation, poor medication adherence, poor inhaler  technique, or progression of COPD or comorbid conditions.  CAT MCID is 2 points  mMRC: mMRC (Modified Medical Research Council) Dyspnea Scale is used to assess the degree of baseline functional disability in patients of respiratory disease due to dyspnea. No minimal important difference is established. A decrease in score of 1 point or greater is considered a positive change.   Pulmonary Function Assessment:   Exercise Target Goals: Exercise Program Goal: Individual exercise prescription set using results from initial 6 min walk test and THRR while considering  patient's activity barriers and safety.   Exercise Prescription Goal: Initial exercise prescription builds to 30-45 minutes a day of aerobic activity, 2-3 days per week.  Home exercise guidelines will be given to patient during program as part of exercise prescription that the participant will acknowledge.  Activity Barriers & Risk Stratification:  Activity Barriers & Cardiac Risk Stratification - 10/19/22 1257       Activity Barriers & Cardiac Risk Stratification   Activity Barriers Arthritis;Back Problems;Joint Problems;Deconditioning;Shortness of Breath;Balance Concerns;History of Falls;Assistive Device    Cardiac Risk Stratification High             6 Minute Walk:  6 Minute Walk     Row Name 10/19/22 1433         6 Minute Walk   Phase Initial     Distance 450 feet     Walk Time 6 minutes     # of Rest Breaks 1     MPH 0.85     RPE 11     Perceived Dyspnea  11     Symptoms Yes (comment)     Comments 1 seated rest break for 1 min 20 sec due to generalized fatigue. Reported right knee pain 4/10     Resting HR 81 bpm     Resting BP 116/72     Resting Oxygen Saturation  94 %     Exercise Oxygen Saturation  during 6 min walk 92 %     Max Ex. HR 94 bpm     Max Ex. BP 144/74     2 Minute Post BP 116/70       Interval HR   1 Minute HR 85     2 Minute HR 83     3 Minute HR 90     4 Minute HR 85     5  Minute HR 90     6 Minute HR 94     2 Minute Post HR 79     Interval Heart Rate? Yes       Interval Oxygen   Interval Oxygen? Yes     Baseline Oxygen Saturation % 94 %     1 Minute Oxygen Saturation % 95 %     1 Minute Liters of Oxygen 0 L     2 Minute Oxygen Saturation % 92 %     2 Minute Liters of Oxygen 0 L     3 Minute Oxygen Saturation % 92 %     3 Minute Liters of Oxygen 0 L     4 Minute Oxygen Saturation % 93 %     4 Minute Liters of Oxygen 0 L     5 Minute Oxygen Saturation % 93 %     5 Minute Liters of Oxygen 0 L     6 Minute Oxygen Saturation % 93 %  6 Minute Liters of Oxygen 0 L     2 Minute Post Oxygen Saturation % 95 %     2 Minute Post Liters of Oxygen 0 L              Oxygen Initial Assessment:  Oxygen Initial Assessment - 10/19/22 1432       Home Oxygen   Home Oxygen Device Home Concentrator    Sleep Oxygen Prescription Continuous    Liters per minute 3    Home Exercise Oxygen Prescription None    Home Resting Oxygen Prescription None    Compliance with Home Oxygen Use No      Initial 6 min Walk   Oxygen Used None      Program Oxygen Prescription   Program Oxygen Prescription None      Intervention   Short Term Goals To learn and understand importance of monitoring SPO2 with pulse oximeter and demonstrate accurate use of the pulse oximeter.;To learn and understand importance of maintaining oxygen saturations>88%;To learn and demonstrate proper pursed lip breathing techniques or other breathing techniques.     Long  Term Goals Verbalizes importance of monitoring SPO2 with pulse oximeter and return demonstration;Maintenance of O2 saturations>88%;Exhibits proper breathing techniques, such as pursed lip breathing or other method taught during program session;Compliance with respiratory medication             Oxygen Re-Evaluation:  Oxygen Re-Evaluation     Row Name 11/03/22 0730 12/01/22 1454 12/29/22 1445 01/26/23 1040       Program Oxygen  Prescription   Program Oxygen Prescription None None None None      Home Oxygen   Home Oxygen Device Home Concentrator Home Concentrator Home Concentrator Home Concentrator    Sleep Oxygen Prescription Continuous Continuous Continuous Continuous    Liters per minute 3 3 3 3     Home Exercise Oxygen Prescription None None None None    Home Resting Oxygen Prescription None None None None    Compliance with Home Oxygen Use No No No No      Goals/Expected Outcomes   Short Term Goals To learn and understand importance of monitoring SPO2 with pulse oximeter and demonstrate accurate use of the pulse oximeter.;To learn and understand importance of maintaining oxygen saturations>88%;To learn and demonstrate proper pursed lip breathing techniques or other breathing techniques.  To learn and understand importance of monitoring SPO2 with pulse oximeter and demonstrate accurate use of the pulse oximeter.;To learn and demonstrate proper pursed lip breathing techniques or other breathing techniques. ;To learn and understand importance of maintaining oxygen saturations>88% To learn and understand importance of monitoring SPO2 with pulse oximeter and demonstrate accurate use of the pulse oximeter.;To learn and demonstrate proper pursed lip breathing techniques or other breathing techniques. ;To learn and understand importance of maintaining oxygen saturations>88% To learn and understand importance of monitoring SPO2 with pulse oximeter and demonstrate accurate use of the pulse oximeter.;To learn and demonstrate proper pursed lip breathing techniques or other breathing techniques. ;To learn and understand importance of maintaining oxygen saturations>88%    Long  Term Goals Verbalizes importance of monitoring SPO2 with pulse oximeter and return demonstration;Maintenance of O2 saturations>88%;Exhibits proper breathing techniques, such as pursed lip breathing or other method taught during program session;Compliance with  respiratory medication Verbalizes importance of monitoring SPO2 with pulse oximeter and return demonstration;Maintenance of O2 saturations>88%;Exhibits proper breathing techniques, such as pursed lip breathing or other method taught during program session;Compliance with respiratory medication Verbalizes importance of monitoring SPO2 with  pulse oximeter and return demonstration;Maintenance of O2 saturations>88%;Exhibits proper breathing techniques, such as pursed lip breathing or other method taught during program session;Compliance with respiratory medication Verbalizes importance of monitoring SPO2 with pulse oximeter and return demonstration;Maintenance of O2 saturations>88%;Exhibits proper breathing techniques, such as pursed lip breathing or other method taught during program session;Compliance with respiratory medication    Goals/Expected Outcomes compliance compliance compliance compliance             Oxygen Discharge (Final Oxygen Re-Evaluation):  Oxygen Re-Evaluation - 01/26/23 1040       Program Oxygen Prescription   Program Oxygen Prescription None      Home Oxygen   Home Oxygen Device Home Concentrator    Sleep Oxygen Prescription Continuous    Liters per minute 3    Home Exercise Oxygen Prescription None    Home Resting Oxygen Prescription None    Compliance with Home Oxygen Use No      Goals/Expected Outcomes   Short Term Goals To learn and understand importance of monitoring SPO2 with pulse oximeter and demonstrate accurate use of the pulse oximeter.;To learn and demonstrate proper pursed lip breathing techniques or other breathing techniques. ;To learn and understand importance of maintaining oxygen saturations>88%    Long  Term Goals Verbalizes importance of monitoring SPO2 with pulse oximeter and return demonstration;Maintenance of O2 saturations>88%;Exhibits proper breathing techniques, such as pursed lip breathing or other method taught during program session;Compliance  with respiratory medication    Goals/Expected Outcomes compliance             Initial Exercise Prescription:  Initial Exercise Prescription - 10/19/22 1400       Date of Initial Exercise RX and Referring Provider   Date 10/19/22    Referring Provider Dr. Maia Plan    Expected Discharge Date 02/24/23      NuStep   Level 1    SPM 80    Minutes 39      Prescription Details   Frequency (times per week) 2    Duration Progress to 30 minutes of continuous aerobic without signs/symptoms of physical distress      Intensity   THRR 40-80% of Max Heartrate 59-118    Ratings of Perceived Exertion 11-13    Perceived Dyspnea 0-4      Resistance Training   Training Prescription Yes    Weight 2    Reps 10-15             Perform Capillary Blood Glucose checks as needed.  Exercise Prescription Changes:   Exercise Prescription Changes     Row Name 11/25/22 1500 12/02/22 1534 12/28/22 1500 01/11/23 1600 01/25/23 1500     Response to Exercise   Blood Pressure (Admit) 104/60 120/70 114/64 112/60 120/68   Blood Pressure (Exercise) 100/62 112/64 110/78 120/70 124/74   Blood Pressure (Exit) 106/60 126/70 124/76 130/62 124/64   Heart Rate (Admit) 86 bpm 92 bpm 86 bpm 92 bpm 76 bpm   Heart Rate (Exercise) 83 bpm 88 bpm 88 bpm 87 bpm 82 bpm   Heart Rate (Exit) 81 bpm 84 bpm 81 bpm 82 bpm 76 bpm   Oxygen Saturation (Admit) 92 % 92 % 94 % 92 % 95 %   Oxygen Saturation (Exercise) 91 % 90 % 91 % 92 % 93 %   Oxygen Saturation (Exit) 90 % 93 % 95 % 95 % 94 %   Rating of Perceived Exertion (Exercise) 11 11 13 12 12    Perceived Dyspnea (Exercise) 11  11 13 13 12    Duration Continue with 30 min of aerobic exercise without signs/symptoms of physical distress. Continue with 30 min of aerobic exercise without signs/symptoms of physical distress. Continue with 30 min of aerobic exercise without signs/symptoms of physical distress. Continue with 30 min of aerobic exercise without  signs/symptoms of physical distress. Continue with 30 min of aerobic exercise without signs/symptoms of physical distress.   Intensity THRR unchanged THRR unchanged THRR unchanged THRR unchanged THRR unchanged     Progression   Progression Continue to progress workloads to maintain intensity without signs/symptoms of physical distress. Continue to progress workloads to maintain intensity without signs/symptoms of physical distress. Continue to progress workloads to maintain intensity without signs/symptoms of physical distress. Continue to progress workloads to maintain intensity without signs/symptoms of physical distress. Continue to progress workloads to maintain intensity without signs/symptoms of physical distress.     Resistance Training   Training Prescription Yes Yes Yes Yes Yes   Weight 2 2 2 2 2    Reps 10-15 10-15 10-15 10-15 10-15   Time 10 Minutes 10 Minutes 10 Minutes 10 Minutes 10 Minutes     NuStep   Level 1 1 1 1 2    SPM 47 46 50 51 48   Minutes 39 39 39 39 39   METs 1.8 1.7 1.8 1.7 1.7    Row Name 02/08/23 1500             Response to Exercise   Blood Pressure (Admit) 102/58       Blood Pressure (Exercise) 102/60       Blood Pressure (Exit) 100/60       Heart Rate (Admit) 94 bpm       Heart Rate (Exercise) 82 bpm       Heart Rate (Exit) 86 bpm       Oxygen Saturation (Admit) 93 %       Oxygen Saturation (Exercise) 92 %       Oxygen Saturation (Exit) 92 %       Rating of Perceived Exertion (Exercise) 12       Perceived Dyspnea (Exercise) 12       Duration Continue with 30 min of aerobic exercise without signs/symptoms of physical distress.       Intensity THRR unchanged         Progression   Progression Continue to progress workloads to maintain intensity without signs/symptoms of physical distress.         Resistance Training   Training Prescription Yes       Weight 2       Reps 10-15       Time 10 Minutes         NuStep   Level 2       SPM 54        Minutes 39       METs 1.7                Exercise Comments:   Exercise Goals and Review:   Exercise Goals     Row Name 10/19/22 1443 11/03/22 0730 12/01/22 1453 12/29/22 1443 01/26/23 1038     Exercise Goals   Increase Physical Activity Yes Yes Yes Yes Yes   Intervention Provide advice, education, support and counseling about physical activity/exercise needs.;Develop an individualized exercise prescription for aerobic and resistive training based on initial evaluation findings, risk stratification, comorbidities and participant's personal goals. Provide advice, education, support and counseling about physical activity/exercise needs.;Develop an  individualized exercise prescription for aerobic and resistive training based on initial evaluation findings, risk stratification, comorbidities and participant's personal goals. Provide advice, education, support and counseling about physical activity/exercise needs.;Develop an individualized exercise prescription for aerobic and resistive training based on initial evaluation findings, risk stratification, comorbidities and participant's personal goals. Provide advice, education, support and counseling about physical activity/exercise needs.;Develop an individualized exercise prescription for aerobic and resistive training based on initial evaluation findings, risk stratification, comorbidities and participant's personal goals. Provide advice, education, support and counseling about physical activity/exercise needs.;Develop an individualized exercise prescription for aerobic and resistive training based on initial evaluation findings, risk stratification, comorbidities and participant's personal goals.   Expected Outcomes Short Term: Attend rehab on a regular basis to increase amount of physical activity.;Long Term: Add in home exercise to make exercise part of routine and to increase amount of physical activity.;Long Term: Exercising regularly at least  3-5 days a week. Short Term: Attend rehab on a regular basis to increase amount of physical activity.;Long Term: Add in home exercise to make exercise part of routine and to increase amount of physical activity.;Long Term: Exercising regularly at least 3-5 days a week. Short Term: Attend rehab on a regular basis to increase amount of physical activity.;Long Term: Add in home exercise to make exercise part of routine and to increase amount of physical activity.;Long Term: Exercising regularly at least 3-5 days a week. Short Term: Attend rehab on a regular basis to increase amount of physical activity.;Long Term: Add in home exercise to make exercise part of routine and to increase amount of physical activity.;Long Term: Exercising regularly at least 3-5 days a week. Short Term: Attend rehab on a regular basis to increase amount of physical activity.;Long Term: Add in home exercise to make exercise part of routine and to increase amount of physical activity.;Long Term: Exercising regularly at least 3-5 days a week.   Increase Strength and Stamina Yes Yes Yes Yes Yes   Intervention Provide advice, education, support and counseling about physical activity/exercise needs.;Develop an individualized exercise prescription for aerobic and resistive training based on initial evaluation findings, risk stratification, comorbidities and participant's personal goals. Provide advice, education, support and counseling about physical activity/exercise needs.;Develop an individualized exercise prescription for aerobic and resistive training based on initial evaluation findings, risk stratification, comorbidities and participant's personal goals. Provide advice, education, support and counseling about physical activity/exercise needs.;Develop an individualized exercise prescription for aerobic and resistive training based on initial evaluation findings, risk stratification, comorbidities and participant's personal goals. Provide  advice, education, support and counseling about physical activity/exercise needs.;Develop an individualized exercise prescription for aerobic and resistive training based on initial evaluation findings, risk stratification, comorbidities and participant's personal goals. Provide advice, education, support and counseling about physical activity/exercise needs.;Develop an individualized exercise prescription for aerobic and resistive training based on initial evaluation findings, risk stratification, comorbidities and participant's personal goals.   Expected Outcomes Short Term: Increase workloads from initial exercise prescription for resistance, speed, and METs.;Short Term: Perform resistance training exercises routinely during rehab and add in resistance training at home;Long Term: Improve cardiorespiratory fitness, muscular endurance and strength as measured by increased METs and functional capacity ( ) Short Term: Increase workloads from initial exercise prescription for resistance, speed, and METs.;Short Term: Perform resistance training exercises routinely during rehab and add in resistance training at home;Long Term: Improve cardiorespiratory fitness, muscular endurance and strength as measured by increased METs and functional capacity ( ) Short Term: Increase workloads from initial exercise prescription for resistance, speed, and METs.;Short  Term: Perform resistance training exercises routinely during rehab and add in resistance training at home;Long Term: Improve cardiorespiratory fitness, muscular endurance and strength as measured by increased METs and functional capacity ( ) Short Term: Increase workloads from initial exercise prescription for resistance, speed, and METs.;Short Term: Perform resistance training exercises routinely during rehab and add in resistance training at home;Long Term: Improve cardiorespiratory fitness, muscular endurance and strength as measured by increased METs and  functional capacity ( ) Short Term: Increase workloads from initial exercise prescription for resistance, speed, and METs.;Short Term: Perform resistance training exercises routinely during rehab and add in resistance training at home;Long Term: Improve cardiorespiratory fitness, muscular endurance and strength as measured by increased METs and functional capacity ( )   Able to understand and use rate of perceived exertion (RPE) scale Yes Yes Yes Yes Yes   Intervention Provide education and explanation on how to use RPE scale Provide education and explanation on how to use RPE scale Provide education and explanation on how to use RPE scale Provide education and explanation on how to use RPE scale Provide education and explanation on how to use RPE scale   Expected Outcomes Short Term: Able to use RPE daily in rehab to express subjective intensity level;Long Term:  Able to use RPE to guide intensity level when exercising independently Short Term: Able to use RPE daily in rehab to express subjective intensity level;Long Term:  Able to use RPE to guide intensity level when exercising independently Short Term: Able to use RPE daily in rehab to express subjective intensity level;Long Term:  Able to use RPE to guide intensity level when exercising independently Short Term: Able to use RPE daily in rehab to express subjective intensity level;Long Term:  Able to use RPE to guide intensity level when exercising independently Short Term: Able to use RPE daily in rehab to express subjective intensity level;Long Term:  Able to use RPE to guide intensity level when exercising independently   Able to understand and use Dyspnea scale Yes Yes Yes Yes Yes   Intervention Provide education and explanation on how to use Dyspnea scale Provide education and explanation on how to use Dyspnea scale Provide education and explanation on how to use Dyspnea scale Provide education and explanation on how to use Dyspnea scale Provide  education and explanation on how to use Dyspnea scale   Expected Outcomes Short Term: Able to use Dyspnea scale daily in rehab to express subjective sense of shortness of breath during exertion;Long Term: Able to use Dyspnea scale to guide intensity level when exercising independently Short Term: Able to use Dyspnea scale daily in rehab to express subjective sense of shortness of breath during exertion;Long Term: Able to use Dyspnea scale to guide intensity level when exercising independently Short Term: Able to use Dyspnea scale daily in rehab to express subjective sense of shortness of breath during exertion;Long Term: Able to use Dyspnea scale to guide intensity level when exercising independently Short Term: Able to use Dyspnea scale daily in rehab to express subjective sense of shortness of breath during exertion;Long Term: Able to use Dyspnea scale to guide intensity level when exercising independently Short Term: Able to use Dyspnea scale daily in rehab to express subjective sense of shortness of breath during exertion;Long Term: Able to use Dyspnea scale to guide intensity level when exercising independently   Knowledge and understanding of Target Heart Rate Range (THRR) Yes Yes Yes Yes Yes   Intervention Provide education and explanation of THRR including how the numbers were  predicted and where they are located for reference Provide education and explanation of THRR including how the numbers were predicted and where they are located for reference Provide education and explanation of THRR including how the numbers were predicted and where they are located for reference Provide education and explanation of THRR including how the numbers were predicted and where they are located for reference Provide education and explanation of THRR including how the numbers were predicted and where they are located for reference   Expected Outcomes Short Term: Able to state/look up THRR;Short Term: Able to use daily as  guideline for intensity in rehab;Long Term: Able to use THRR to govern intensity when exercising independently Short Term: Able to state/look up THRR;Short Term: Able to use daily as guideline for intensity in rehab;Long Term: Able to use THRR to govern intensity when exercising independently Short Term: Able to state/look up THRR;Short Term: Able to use daily as guideline for intensity in rehab;Long Term: Able to use THRR to govern intensity when exercising independently Short Term: Able to state/look up THRR;Short Term: Able to use daily as guideline for intensity in rehab;Long Term: Able to use THRR to govern intensity when exercising independently Short Term: Able to state/look up THRR;Short Term: Able to use daily as guideline for intensity in rehab;Long Term: Able to use THRR to govern intensity when exercising independently   Understanding of Exercise Prescription Yes Yes Yes Yes Yes   Intervention Provide education, explanation, and written materials on patient's individual exercise prescription Provide education, explanation, and written materials on patient's individual exercise prescription Provide education, explanation, and written materials on patient's individual exercise prescription Provide education, explanation, and written materials on patient's individual exercise prescription Provide education, explanation, and written materials on patient's individual exercise prescription   Expected Outcomes Long Term: Able to explain home exercise prescription to exercise independently;Short Term: Able to explain program exercise prescription Long Term: Able to explain home exercise prescription to exercise independently;Short Term: Able to explain program exercise prescription Long Term: Able to explain home exercise prescription to exercise independently;Short Term: Able to explain program exercise prescription Long Term: Able to explain home exercise prescription to exercise independently;Short Term:  Able to explain program exercise prescription Long Term: Able to explain home exercise prescription to exercise independently;Short Term: Able to explain program exercise prescription            Exercise Goals Re-Evaluation :  Exercise Goals Re-Evaluation     Row Name 11/03/22 0731 12/01/22 1453 12/29/22 1443 01/26/23 1038       Exercise Goal Re-Evaluation   Exercise Goals Review Increase Physical Activity;Able to understand and use rate of perceived exertion (RPE) scale;Increase Strength and Stamina;Able to understand and use Dyspnea scale;Knowledge and understanding of Target Heart Rate Range (THRR);Understanding of Exercise Prescription Increase Physical Activity;Increase Strength and Stamina;Able to understand and use rate of perceived exertion (RPE) scale;Able to understand and use Dyspnea scale;Knowledge and understanding of Target Heart Rate Range (THRR);Understanding of Exercise Prescription Increase Physical Activity;Increase Strength and Stamina;Able to understand and use rate of perceived exertion (RPE) scale;Able to understand and use Dyspnea scale;Knowledge and understanding of Target Heart Rate Range (THRR);Understanding of Exercise Prescription Increase Physical Activity;Increase Strength and Stamina;Able to understand and use rate of perceived exertion (RPE) scale;Knowledge and understanding of Target Heart Rate Range (THRR);Able to check pulse independently;Understanding of Exercise Prescription    Comments Pt has not attended a PR session yet. Her daughter reports that she has pneumonia, so she has missed her first  two weeks. She is hopeful to begin 4/2. Unable to assess at this time due to lack of attendance. Pt has completed 2 sessions of PR. She is deconditioned and will progress slowly in  the program. She is currently exercising at 1.8 METs on  the stepper. Will continue to monitor and progress as able., Pt has completed 5 sessions of PR. She is deconditioned and will progress  slowly. She has started to increase her SPM on the stepper which is a small goal. She is currently exercising at 1.8 METs on the stepper. Will continue to monitor and progress as able . Pt has completed 9 sessions of PR. SHe continues to be deconditioned but has started to progress by increasing her level on the stepper. She is still has low SPM. She sometimes misses classes due to transpertation. She is currenttly exercising at 1.7 METs on the stepper. Will continue to monitor and progress as able.    Expected Outcomes Through exercise at rehab and at home, the patient will meet their stated goals. Through exercise at rehab and at home, the patient will meet their stated goals. Through exercise at rehab and at home, the patient will meet their stated goals. Through exercise at rehab and at home, the patient will meet their stated goals.             Discharge Exercise Prescription (Final Exercise Prescription Changes):  Exercise Prescription Changes - 02/08/23 1500       Response to Exercise   Blood Pressure (Admit) 102/58    Blood Pressure (Exercise) 102/60    Blood Pressure (Exit) 100/60    Heart Rate (Admit) 94 bpm    Heart Rate (Exercise) 82 bpm    Heart Rate (Exit) 86 bpm    Oxygen Saturation (Admit) 93 %    Oxygen Saturation (Exercise) 92 %    Oxygen Saturation (Exit) 92 %    Rating of Perceived Exertion (Exercise) 12    Perceived Dyspnea (Exercise) 12    Duration Continue with 30 min of aerobic exercise without signs/symptoms of physical distress.    Intensity THRR unchanged      Progression   Progression Continue to progress workloads to maintain intensity without signs/symptoms of physical distress.      Resistance Training   Training Prescription Yes    Weight 2    Reps 10-15    Time 10 Minutes      NuStep   Level 2    SPM 54    Minutes 39    METs 1.7             Nutrition:  Target Goals: Understanding of nutrition guidelines, daily intake of sodium  1500mg , cholesterol 200mg , calories 30% from fat and 7% or less from saturated fats, daily to have 5 or more servings of fruits and vegetables.  Biometrics:  Pre Biometrics - 10/19/22 1450       Pre Biometrics   Height 5' (1.524 m)    Weight 171 lb 15.3 oz (78 kg)    Waist Circumference 44.5 inches    Hip Circumference 45.5 inches    Waist to Hip Ratio 0.98 %    BMI (Calculated) 33.58    Triceps Skinfold 43 mm    % Body Fat 49.1 %    Grip Strength 43 kg    Flexibility 0 in    Single Leg Stand 0 seconds              Nutrition Therapy Plan and  Nutrition Goals:  Nutrition Therapy & Goals - 02/14/23 1408       Nutrition Therapy   RD appointment deferred Yes      Personal Nutrition Goals   Comments We provide educational material on heart healthy nutrition with handouts.      Intervention Plan   Intervention Nutrition handout(s) given to patient.    Expected Outcomes Short Term Goal: Understand basic principles of dietary content, such as calories, fat, sodium, cholesterol and nutrients.             Nutrition Assessments:  Nutrition Assessments - 10/19/22 1315       MEDFICTS Scores   Pre Score 45            MEDIFICTS Score Key: ?70 Need to make dietary changes  40-70 Heart Healthy Diet ? 40 Therapeutic Level Cholesterol Diet   Picture Your Plate Scores: <78 Unhealthy dietary pattern with much room for improvement. 41-50 Dietary pattern unlikely to meet recommendations for good health and room for improvement. 51-60 More healthful dietary pattern, with some room for improvement.  >60 Healthy dietary pattern, although there may be some specific behaviors that could be improved.    Nutrition Goals Re-Evaluation:   Nutrition Goals Discharge (Final Nutrition Goals Re-Evaluation):   Psychosocial: Target Goals: Acknowledge presence or absence of significant depression and/or stress, maximize coping skills, provide positive support system.  Participant is able to verbalize types and ability to use techniques and skills needed for reducing stress and depression.  Initial Review & Psychosocial Screening:  Initial Psych Review & Screening - 10/19/22 1301       Initial Review   Current issues with Current Depression;Current Sleep Concerns      Family Dynamics   Good Support System? Yes    Comments Her son, daugher, son in law, grandson, and sister support her.      Barriers   Psychosocial barriers to participate in program There are no identifiable barriers or psychosocial needs.      Screening Interventions   Interventions Encouraged to exercise;Provide feedback about the scores to participant    Expected Outcomes Long Term goal: The participant improves quality of Life and PHQ9 Scores as seen by post scores and/or verbalization of changes;Short Term goal: Identification and review with participant of any Quality of Life or Depression concerns found by scoring the questionnaire.             Quality of Life Scores:  Quality of Life - 10/19/22 1432       Quality of Life   Select Quality of Life      Quality of Life Scores   Health/Function Pre 13.13 %    Socioeconomic Pre 26.92 %    Psych/Spiritual Pre 26.57 %    Family Pre 30 %    GLOBAL Pre 20.77 %            Scores of 19 and below usually indicate a poorer quality of life in these areas.  A difference of  2-3 points is a clinically meaningful difference.  A difference of 2-3 points in the total score of the Quality of Life Index has been associated with significant improvement in overall quality of life, self-image, physical symptoms, and general health in studies assessing change in quality of life.   PHQ-9: Review Flowsheet       10/19/2022  Depression screen PHQ 2/9  Decreased Interest 1  Down, Depressed, Hopeless 1  PHQ - 2 Score 2  Altered sleeping 3  Tired, decreased energy 3  Change in appetite 1  Feeling bad or failure about yourself  1   Trouble concentrating 1  Moving slowly or fidgety/restless 0  Suicidal thoughts 0  PHQ-9 Score 11  Difficult doing work/chores Somewhat difficult    Details           Interpretation of Total Score  Total Score Depression Severity:  1-4 = Minimal depression, 5-9 = Mild depression, 10-14 = Moderate depression, 15-19 = Moderately severe depression, 20-27 = Severe depression   Psychosocial Evaluation and Intervention:  Psychosocial Evaluation - 10/19/22 1454       Psychosocial Evaluation & Interventions   Interventions Stress management education;Relaxation education;Encouraged to exercise with the program and follow exercise prescription    Comments Pt has no barriers to participate in PR. She has no identifiable psychosocial issues. She does have depression, but she states that it is well managed with buspirone 20 mg twice daily and citalopram 20 mg once daily. Her husband passed away back in 01/13/2022, but she reports that she has been able to cope well with this. She is with her daughter, Joni Reining, today. Joni Reining is her primary caregiver and takes her to all of her appointments. Pt also reports problems with her sleep, but she does not take anything for her sleep. She scored a 14 on her PHQ-9, and most of this relates to her depression and the effects of her COPD. She reports not being able to go out much due to her SOB. Outside of her daughter, she also lists her son, son in law, grandson, and sister as her support system. Her goals while in the program are to decrease her SOB with exertion and to gain her confidence back. She would like to be more confident that she can do activities such as shopping or walking up stairs without becoming SOB. She is eager to start the program.    Expected Outcomes Pt's depression will continue to be treated, and she will have no other identifiable psychosocial issues.    Continue Psychosocial Services  No Follow up required             Psychosocial  Re-Evaluation:  Psychosocial Re-Evaluation     Row Name 10/25/22 9604 11/19/22 1339 12/20/22 1431 01/17/23 1301 02/14/23 1409     Psychosocial Re-Evaluation   Current issues with History of Depression;None Identified;Current Psychotropic Meds History of Depression;None Identified;Current Psychotropic Meds History of Depression;Current Psychotropic Meds History of Depression;Current Psychotropic Meds History of Depression;Current Psychotropic Meds   Comments Patient has not started class yet, plans to this week.  Her initial PHQ-7 score is 14 and QOL score is 20.77%.  Patient was referred to PR with COPD via Dr. Renaldo Reel.  Her personal goals are to decrease her SOB with exertion, go up stairs without SOB and gain confidence to go back shopping. Patient plans to start soon.  Her initial PHQ-7 score is 14 and QOL score is 20.77%.  Patient was referred to PR with COPD via Dr. Renaldo Reel.  Her personal goals are to decrease her SOB with exertion, go up stairs without SOB and gain confidence to go back shopping. Patient has started with attending only 3 sessions.  Her attendance is limited due to transportation problems.  Patient was referred to PR with COPD via Dr. Renaldo Reel.  She reports having a history of depression that is managed with Buspar and citalopram.  She seems to enjoy coming to class and interactive with others in class. We will  contiue to monitor her as she progresses in the program. Patient has completed 7th sessions.  Her attendance is limited due to transportation problems.  Patient was referred to PR with COPD via Dr. Renaldo Reel.  She reports having a history of depression that is managed with Buspar and citalopram.  She seems to enjoy coming to class.  She continues to demonstrate a positive outlook and interactive with class and staff. We will contiue to monitor her as she progresses in the program. Patient has completed 12 sessions. Transportation is a barrier to her being able to attend. There are no other  physhosocial barriers identified. Patient's depression continues to be managed with Buspar and citalopram.  She continues to  enjoy coming the sessions.  She continues to demonstrate a positive outlook and interactive with class and staff. We will contiue to monitor her as she progresses in the program.   Expected Outcomes Patient will have no psychosocial issuses identified at discharge. Patient will have no psychosocial issuses identified at discharge. Patient will have no psychosocial issuses identified at discharge. Patient will have no psychosocial issuses identified at discharge. Patient will have no psychosocial issuses identified at discharge.   Interventions Stress management education;Relaxation education;Encouraged to attend Pulmonary Rehabilitation for the exercise Stress management education;Relaxation education;Encouraged to attend Pulmonary Rehabilitation for the exercise Stress management education;Relaxation education;Encouraged to attend Pulmonary Rehabilitation for the exercise Stress management education;Relaxation education;Encouraged to attend Pulmonary Rehabilitation for the exercise Stress management education;Relaxation education;Encouraged to attend Pulmonary Rehabilitation for the exercise   Continue Psychosocial Services  No Follow up required No Follow up required No Follow up required No Follow up required No Follow up required            Psychosocial Discharge (Final Psychosocial Re-Evaluation):  Psychosocial Re-Evaluation - 02/14/23 1409       Psychosocial Re-Evaluation   Current issues with History of Depression;Current Psychotropic Meds    Comments Patient has completed 12 sessions. Transportation is a barrier to her being able to attend. There are no other physhosocial barriers identified. Patient's depression continues to be managed with Buspar and citalopram.  She continues to  enjoy coming the sessions.  She continues to demonstrate a positive outlook and  interactive with class and staff. We will contiue to monitor her as she progresses in the program.    Expected Outcomes Patient will have no psychosocial issuses identified at discharge.    Interventions Stress management education;Relaxation education;Encouraged to attend Pulmonary Rehabilitation for the exercise    Continue Psychosocial Services  No Follow up required              Education: Education Goals: Education classes will be provided on a weekly basis, covering required topics. Participant will state understanding/return demonstration of topics presented.  Learning Barriers/Preferences:  Learning Barriers/Preferences - 10/19/22 1326       Learning Barriers/Preferences   Learning Barriers None    Learning Preferences Written Material             Education Topics: How Lungs Work and Diseases: - Discuss the anatomy of the lungs and diseases that can affect the lungs, such as COPD.   Exercise: -Discuss the importance of exercise, FITT principles of exercise, normal and abnormal responses to exercise, and how to exercise safely.   Environmental Irritants: -Discuss types of environmental irritants and how to limit exposure to environmental irritants. Flowsheet Row PULMONARY REHAB OTHER RESPIRATORY from 02/17/2023 in [place] [facility]  Date 02/17/23  Educator handout  Meds/Inhalers and oxygen: - Discuss respiratory medications, definition of an inhaler and oxygen, and the proper way to use an inhaler and oxygen. Flowsheet Row PULMONARY REHAB OTHER RESPIRATORY from 02/17/2023 in [place] [facility]  Date 11/25/22  Educator handout       Energy Saving Techniques: - Discuss methods to conserve energy and decrease shortness of breath when performing activities of daily living.  Flowsheet Row PULMONARY REHAB OTHER RESPIRATORY from 02/17/2023 in [place] [facility]  Date 12/02/22  Educator handout        Bronchial Hygiene / Breathing Techniques: - Discuss breathing mechanics, pursed-lip breathing technique,  proper posture, effective ways to clear airways, and other functional breathing techniques   Cleaning Equipment: - Provides group verbal and written instruction about the health risks of elevated stress, cause of high stress, and healthy ways to reduce stress.   Nutrition I: Fats: - Discuss the types of cholesterol, what cholesterol does to the body, and how cholesterol levels can be controlled. Flowsheet Row PULMONARY REHAB OTHER RESPIRATORY from 02/17/2023 in [place] [facility]  Date 12/23/22  Educator handout       Nutrition II: Labels: -Discuss the different components of food labels and how to read food labels.   Respiratory Infections: - Discuss the signs and symptoms of respiratory infections, ways to prevent respiratory infections, and the importance of seeking medical treatment when having a respiratory infection.   Stress I: Signs and Symptoms: - Discuss the causes of stress, how stress may lead to anxiety and depression, and ways to limit stress.   Stress II: Relaxation: -Discuss relaxation techniques to limit stress. Flowsheet Row PULMONARY REHAB OTHER RESPIRATORY from 02/17/2023 in [place] [facility]  Date 01/27/23  Educator handout       Oxygen for Home/Travel: - Discuss how to prepare for travel when on oxygen and proper ways to transport and store oxygen to ensure safety. Flowsheet Row PULMONARY REHAB OTHER RESPIRATORY from 02/17/2023 in [place] [facility]  Date 02/03/23  Educator handout       Knowledge Questionnaire Score:  Knowledge Questionnaire Score - 10/19/22 1327       Knowledge Questionnaire Score   Pre Score 0/18, did not complete, but reviewed all correct answers with patient             Core Components/Risk Factors/Patient Goals at Admission:  Personal Goals and Risk Factors at  Admission - 10/19/22 1336       Core Components/Risk Factors/Patient Goals on Admission   Improve shortness of breath with ADL's Yes    Intervention Provide education, individualized exercise plan and daily activity instruction to help decrease symptoms of SOB with activities of daily living.    Expected Outcomes Short Term: Improve cardiorespiratory fitness to achieve a reduction of symptoms when performing ADLs;Long Term: Be able to perform more ADLs without symptoms or delay the onset of symptoms    Increase knowledge of respiratory medications and ability to use respiratory devices properly  Yes    Intervention Provide education and demonstration as needed of appropriate use of medications, inhalers, and oxygen therapy.    Expected Outcomes Short Term: Achieves understanding of medications use. Understands that oxygen is a medication prescribed by physician. Demonstrates appropriate use of inhaler and oxygen therapy.;Long Term: Maintain appropriate use of medications, inhalers, and oxygen therapy.    Diabetes Yes    Intervention Provide education about signs/symptoms and action to take for hypo/hyperglycemia.;Provide education about proper nutrition, including hydration, and aerobic/resistive exercise  prescription along with prescribed medications to achieve blood glucose in normal ranges: Fasting glucose 65-99 mg/dL    Expected Outcomes Short Term: Participant verbalizes understanding of the signs/symptoms and immediate care of hyper/hypoglycemia, proper foot care and importance of medication, aerobic/resistive exercise and nutrition plan for blood glucose control.;Long Term: Attainment of HbA1C < 7%.    Personal Goal Other Yes    Personal Goal Increase confidence in doing things for herself and going out more.    Intervention Attend PR two days per week and begin a home exercise program.    Expected Outcomes Pt will meet stated goals.             Core Components/Risk Factors/Patient  Goals Review:   Goals and Risk Factor Review     Row Name 10/25/22 0941 11/19/22 1342 12/20/22 1443 01/17/23 1307 02/14/23 1411     Core Components/Risk Factors/Patient Goals Review   Personal Goals Review Improve shortness of breath with ADL's;Develop more efficient breathing techniques such as purse lipped breathing and diaphragmatic breathing and practicing self-pacing with activity.;Other Improve shortness of breath with ADL's;Develop more efficient breathing techniques such as purse lipped breathing and diaphragmatic breathing and practicing self-pacing with activity.;Other Improve shortness of breath with ADL's;Develop more efficient breathing techniques such as purse lipped breathing and diaphragmatic breathing and practicing self-pacing with activity.;Other Improve shortness of breath with ADL's;Develop more efficient breathing techniques such as purse lipped breathing and diaphragmatic breathing and practicing self-pacing with activity.;Other Improve shortness of breath with ADL's;Develop more efficient breathing techniques such as purse lipped breathing and diaphragmatic breathing and practicing self-pacing with activity.;Other   Review Patient plans to start program soon.  Patient referred to PR with COPD via Dr. Renaldo Reel.  Her personal goals for the program is to decrease SOB with exertion, go up stairs without SOB and gain confidence to go back shopping without SOB.  We will continue to monitor her progress as she works toward meeting these goals. Patient plans to start program soon.  Patient referred to PR with COPD via Dr. Renaldo Reel.  Her personal goals for the program is to decrease SOB with exertion, go up stairs without SOB and gain confidence to go back shopping without SOB.  We will continue to monitor her progress as she works toward meeting these goals. Patient has started program with only attending 3 sessions. Her low attendance is due to transportation problems.  Her personal goals for the  program is to decrease SOB with exertion, go up stairs without SOB and gain confidence to go back shopping without SOB.  We will help her develpe a efficient breathing technique, such as purse lipped breathing and practice self pacing with increased activity.  We will continue to monitor her progress as she works toward meeting these goals. Patient has completed 7th sessions. Her low attendance is due to transportation problems.  Her personal goals for the program is to decrease SOB with exertion, go up stairs without SOB and gain confidence to go back shopping without SOB.  We will help her develope a efficient breathing technique, such as purse lipped breathing and practice self pacing with increased activity. Her O2 sats average between 90%-94% without  any supplemental oxygen needed at  this time.  We will continue to monitor her progress as she works toward meeting these goals. Patient has completed 12sessions. She is doing well in the program with inconsistent attendance due to transportation. Her current weight is 75.3 KG down 1 KG from last 30 day review.  She exercises on RA with O2 sats averaging between 90%-92%. Her personal goals for the program is to decrease SOB with exertion, go up stairs without SOB and gain confidence to go back shopping without SOB.We will continue to monitor her progress as she works toward meeting these goals.   Expected Outcomes Patient will complete the program meeting both program and personal goals. Patient will complete the program meeting both program and personal goals. Patient will complete the program meeting both program and personal goals. Patient will complete the program meeting both program and personal goals. Patient will complete the program meeting both program and personal goals.            Core Components/Risk Factors/Patient Goals at Discharge (Final Review):   Goals and Risk Factor Review - 02/14/23 1411       Core Components/Risk Factors/Patient  Goals Review   Personal Goals Review Improve shortness of breath with ADL's;Develop more efficient breathing techniques such as purse lipped breathing and diaphragmatic breathing and practicing self-pacing with activity.;Other    Review Patient has completed 12sessions. She is doing well in the program with inconsistent attendance due to transportation. Her current weight is 75.3 KG down 1 KG from last 30 day review. She exercises on RA with O2 sats averaging between 90%-92%. Her personal goals for the program is to decrease SOB with exertion, go up stairs without SOB and gain confidence to go back shopping without SOB.We will continue to monitor her progress as she works toward meeting these goals.    Expected Outcomes Patient will complete the program meeting both program and personal goals.             ITP Comments:  ITP Comments     Row Name 02/23/23 1615           ITP Comments 30 day review completed. ITP sent to Dr.Jehanzeb Memon, Medical Director of  Pulmonary Rehab. Continue with ITP unless changes are made by physician.                Comments: 30 day review

## 2023-02-24 ENCOUNTER — Encounter (HOSPITAL_COMMUNITY): Admission: RE | Admit: 2023-02-24 | Payer: Medicare Other | Source: Ambulatory Visit

## 2023-03-01 ENCOUNTER — Encounter (HOSPITAL_COMMUNITY): Payer: Medicare Other

## 2023-03-03 ENCOUNTER — Encounter (HOSPITAL_COMMUNITY): Payer: Medicare Other

## 2023-03-08 ENCOUNTER — Encounter (HOSPITAL_COMMUNITY): Payer: Medicare Other

## 2023-03-09 ENCOUNTER — Telehealth (HOSPITAL_COMMUNITY): Payer: Self-pay | Admitting: Emergency Medicine

## 2023-03-09 NOTE — Telephone Encounter (Signed)
FALLS and COPD exacerbation.  Daughter report pt to see doctor on Friday Aug 2nd.  Will bring note to clear for PR.  Informed that pt has 20 more sessions.

## 2023-03-10 ENCOUNTER — Encounter (HOSPITAL_COMMUNITY): Payer: Medicare Other

## 2023-03-10 DIAGNOSIS — J449 Chronic obstructive pulmonary disease, unspecified: Secondary | ICD-10-CM | POA: Insufficient documentation

## 2023-03-15 ENCOUNTER — Encounter (HOSPITAL_COMMUNITY)
Admission: RE | Admit: 2023-03-15 | Discharge: 2023-03-15 | Disposition: A | Discharge status: Home / Self Care | Payer: Medicare Other | Source: Ambulatory Visit | Attending: Critical Care Medicine | Admitting: Critical Care Medicine

## 2023-03-15 DIAGNOSIS — J449 Chronic obstructive pulmonary disease, unspecified: Secondary | ICD-10-CM

## 2023-03-15 NOTE — Progress Notes (Signed)
Daily Session Note  Patient Details  Name: Kimberly Graves MRN: [id number] Date of Birth: 02/15/1951 Referring Provider:   Flowsheet Row PULMONARY REHAB COPD ORIENTATION from 10/19/2022 in [facility]  Referring Provider Dr. Maia Plan       Encounter Date: 03/15/2023  Check In:  Session Check In - 03/15/23 1500       Check-In   Supervising physician immediately available to respond to emergencies See telemetry face sheet for immediately available MD    Physician(s) Dr. Jenene Slicker    Location [facility]    Staff Present Erskine Speed, RN;Jessica [place], MA, RCEP, CCRP, CCET;Daphyne Daphine Deutscher, RN, BSN    Virtual Visit No    Medication changes reported     No    Fall or balance concerns reported    Yes    Comments She has fallen several times over the past year and she uses a walker.    Tobacco Cessation No Change    Warm-up and Cool-down Performed on first and last piece of equipment    Resistance Training Performed Yes    VAD Patient? No    PAD/SET Patient? No      Pain Assessment   Currently in Pain? No/denies    Pain Score 0-No pain    Multiple Pain Sites No             Capillary Blood Glucose: No results found for this or any previous visit (from the past 24 hour(s)).    Social History   Tobacco Use  Smoking Status Former   Current packs/day: 0.00   Types: Cigarettes   Quit date: 2007   Years since quitting: 17.6   Passive exposure: Past  Smokeless Tobacco Not on file    Goals Met:  Proper associated with RPD/PD & O2 Sat Independence with exercise equipment Using PLB without cueing & demonstrates good technique Exercise tolerated well No report of concerns or symptoms today Strength training completed today  Goals Unmet:  Not Applicable  Comments: Pt able to follow exercise prescription today without complaint.  Will continue to monitor for progression.    Dr. Erick Blinks is Medical Director  for [facility].

## 2023-03-17 ENCOUNTER — Encounter (HOSPITAL_COMMUNITY): Payer: Medicare Other

## 2023-03-22 ENCOUNTER — Encounter (HOSPITAL_COMMUNITY): Payer: Medicare Other

## 2023-03-23 ENCOUNTER — Encounter (HOSPITAL_COMMUNITY): Payer: Self-pay | Admitting: *Deleted

## 2023-03-23 DIAGNOSIS — J449 Chronic obstructive pulmonary disease, unspecified: Secondary | ICD-10-CM

## 2023-03-23 NOTE — Progress Notes (Signed)
Pulmonary Individual Treatment Plan  Patient Details  Name: Kimberly Graves MRN: [id number] Date of Birth: 1950/08/12 Referring Provider:   Flowsheet Row PULMONARY REHAB COPD ORIENTATION from 10/19/2022 in [facility] CARDIAC REHABILITATION  Referring Provider Dr. Maia Plan       Initial Encounter Date:  Flowsheet Row PULMONARY REHAB COPD ORIENTATION from 10/19/2022 in [facility]  Date 10/19/22       Visit Diagnosis: Chronic obstructive pulmonary disease, unspecified COPD type (HCC)  Patient's Home Medications on Admission:   Current Outpatient Medications:    acetaminophen (TYLENOL) 500 MG tablet, Take 1,000 mg by mouth daily., Disp: , Rfl:    albuterol (PROVENTIL) (2.5 MG/3ML) 0.083% nebulizer solution, Take 2.5 mg by nebulization every 6 (six) hours as needed for shortness of breath or wheezing., Disp: , Rfl:    albuterol (VENTOLIN HFA) 108 (90 Base) MCG/ACT inhaler, Inhale 2 puffs into the lungs every 6 (six) hours as needed for shortness of breath., Disp: , Rfl:    aspirin 81 MG chewable tablet, Chew 81 mg by mouth at bedtime., Disp: , Rfl:    atorvastatin (LIPITOR) 80 MG tablet, Take 1 tablet by mouth daily., Disp: , Rfl:    Blood Glucose Monitoring Suppl (ACCU-CHEK GUIDE ME) w/Device KIT, 1 Piece by Does not apply route as directed., Disp: 1 kit, Rfl: 0   busPIRone (BUSPAR) 10 MG tablet, Take 10 mg by mouth 2 (two) times daily., Disp: , Rfl:    carvedilol (COREG) 3.125 MG tablet, Take 3.125 mg by mouth 2 (two) times daily with a meal., Disp: , Rfl:    cetirizine (ZYRTEC) 10 MG tablet, Take 10 mg by mouth daily., Disp: , Rfl:    citalopram (CELEXA) 20 MG tablet, Take 20 mg by mouth daily., Disp: , Rfl:    Cranberry 200 MG CAPS, Take 200 mg by mouth at bedtime., Disp: , Rfl:    Dulaglutide (TRULICITY) 1.5 MG/0.5ML SOPN, Inject 1.5 mg into the skin once a week., Disp: 2 mL, Rfl: 2   Dulaglutide (TRULICITY) 4.5 MG/0.5ML SOPN, Inject 4.5 mg as directed  once a week., Disp: 2 mL, Rfl: 2   empagliflozin (JARDIANCE) 10 MG TABS tablet, Take 1 tablet (10 mg total) by mouth daily before breakfast., Disp: 90 tablet, Rfl: 1   fenofibrate (TRICOR) 145 MG tablet, Take 290 mg by mouth daily., Disp: , Rfl:    glucose blood (ACCU-CHEK GUIDE) test strip, Use as instructed, Disp: 100 each, Rfl: 2   ibuprofen (ADVIL) 200 MG tablet, Take 400 mg by mouth at bedtime., Disp: , Rfl:    Insulin Pen Needle (B-D ULTRAFINE III SHORT PEN) 31G X 8 MM MISC, 1 each by Does not apply route as directed., Disp: 50 each, Rfl: 1   levETIRAcetam (KEPPRA) 500 MG tablet, Take 500 mg by mouth 2 (two) times daily., Disp: , Rfl:    lisinopril (ZESTRIL) 5 MG tablet, Take 1 tablet (5 mg total) by mouth daily., Disp: , Rfl:    loperamide (IMODIUM A-D) 2 MG tablet, Take 2 mg by mouth 4 (four) times daily as needed for diarrhea or loose stools., Disp: , Rfl:    Multiple Vitamin (MULTIVITAMIN) capsule, Take 1 capsule by mouth daily., Disp: , Rfl:    omeprazole (PRILOSEC) 40 MG capsule, Take 40 mg by mouth daily before breakfast., Disp: , Rfl:    pregabalin (LYRICA) 75 MG capsule, Take 75 mg by mouth 2 (two) times daily., Disp: , Rfl:    TRELEGY ELLIPTA 200-62.5-25 MCG/ACT  AEPB, Inhale 1 puff into the lungs daily., Disp: , Rfl:    trolamine salicylate (ASPERCREME) 10 % cream, Apply 1 Application topically as needed for muscle pain., Disp: , Rfl:    VASCEPA 1 g capsule, TAKE ONE CAPSULE BY MOUTH TWICE DAILY WITH FOOD, Disp: 60 capsule, Rfl: 5  Past Medical History: Past Medical History:  Diagnosis Date   COPD (chronic obstructive pulmonary disease) (HCC)    Diabetes mellitus, type II (HCC)    Hypertension    Myocardial infarction (HCC) 2020   Neuromuscular disorder (HCC)    Rectal cancer (HCC)    Small cell carcinoma (HCC) 2007    Tobacco Use: Social History   Tobacco Use  Smoking Status Former   Current packs/day: 0.00   Types: Cigarettes   Quit date: 2007   Years since  quitting: 17.6   Passive exposure: Past  Smokeless Tobacco Not on file    Labs: Review Flowsheet  More data exists      Latest Ref Rng & Units 10/24/2021 03/31/2022 04/27/2022 08/12/2022 01/11/2023  Labs for ITP Cardiac and Pulmonary Rehab  Cholestrol 100 - 199 mg/dL - - 865     - 784   LDL (calc) 0 - 99 mg/dL - - - - 90   HDL-C >69 mg/dL - - 31     - 28   Trlycerides 0 - 149 mg/dL - - 629     - 528   Hemoglobin A1c 0.0 - 7.0 % 6.7  8.1  - 7.7  -    Details       This result is from an external source.         Capillary Blood Glucose: Lab Results  Component Value Date   GLUCAP 173 (H) 12/16/2022   GLUCAP 236 (H) 12/02/2022   GLUCAP 230 (H) 11/25/2022   GLUCAP 247 (H) 11/23/2022   GLUCAP 168 (H) 10/25/2021    POCT Glucose     Row Name 10/19/22 1340             POCT Blood Glucose   Pre-Exercise 149 mg/dL                Pulmonary Assessment Scores:  Pulmonary Assessment Scores     Row Name 10/19/22 1255         ADL UCSD   ADL Phase Entry     SOB Score total 88     Rest 2     Walk 4     Stairs 5     Bath 4     Dress 3     Shop 3       CAT Score   CAT Score 33       mMRC Score   mMRC Score 4             UCSD: Self-administered rating of dyspnea associated with activities of daily living (ADLs) 6-point scale (0 = "not at all" to 5 = "maximal or unable to do because of breathlessness")  Scoring Scores range from 0 to 120.  Minimally important difference is 5 units  CAT: CAT can identify the health impairment of COPD patients and is better correlated with disease progression.  CAT has a scoring range of zero to 40. The CAT score is classified into four groups of low (less than 10), medium (10 - 20), high (21-30) and very high (31-40) based on the impact level of disease on health status. A CAT score over 10 suggests significant  symptoms.  A worsening CAT score could be explained by an exacerbation, poor medication adherence, poor inhaler  technique, or progression of COPD or comorbid conditions.  CAT MCID is 2 points  mMRC: mMRC (Modified Medical Research Council) Dyspnea Scale is used to assess the degree of baseline functional disability in patients of respiratory disease due to dyspnea. No minimal important difference is established. A decrease in score of 1 point or greater is considered a positive change.   Pulmonary Function Assessment:   Exercise Target Goals: Exercise Program Goal: Individual exercise prescription set using results from initial 6 min walk test and THRR while considering  patient's activity barriers and safety.   Exercise Prescription Goal: Initial exercise prescription builds to 30-45 minutes a day of aerobic activity, 2-3 days per week.  Home exercise guidelines will be given to patient during program as part of exercise prescription that the participant will acknowledge.  Activity Barriers & Risk Stratification:  Activity Barriers & Cardiac Risk Stratification - 10/19/22 1257       Activity Barriers & Cardiac Risk Stratification   Activity Barriers Arthritis;Back Problems;Joint Problems;Deconditioning;Shortness of Breath;Balance Concerns;History of Falls;Assistive Device    Cardiac Risk Stratification High             6 Minute Walk:  6 Minute Walk     Row Name 10/19/22 1433         6 Minute Walk   Phase Initial     Distance 450 feet     Walk Time 6 minutes     # of Rest Breaks 1     MPH 0.85     RPE 11     Perceived Dyspnea  11     Symptoms Yes (comment)     Comments 1 seated rest break for 1 min 20 sec due to generalized fatigue. Reported right knee pain 4/10     Resting HR 81 bpm     Resting BP 116/72     Resting Oxygen Saturation  94 %     Exercise Oxygen Saturation  during 6 min walk 92 %     Max Ex. HR 94 bpm     Max Ex. BP 144/74     2 Minute Post BP 116/70       Interval HR   1 Minute HR 85     2 Minute HR 83     3 Minute HR 90     4 Minute HR 85     5  Minute HR 90     6 Minute HR 94     2 Minute Post HR 79     Interval Heart Rate? Yes       Interval Oxygen   Interval Oxygen? Yes     Baseline Oxygen Saturation % 94 %     1 Minute Oxygen Saturation % 95 %     1 Minute Liters of Oxygen 0 L     2 Minute Oxygen Saturation % 92 %     2 Minute Liters of Oxygen 0 L     3 Minute Oxygen Saturation % 92 %     3 Minute Liters of Oxygen 0 L     4 Minute Oxygen Saturation % 93 %     4 Minute Liters of Oxygen 0 L     5 Minute Oxygen Saturation % 93 %     5 Minute Liters of Oxygen 0 L     6 Minute Oxygen Saturation % 93 %  6 Minute Liters of Oxygen 0 L     2 Minute Post Oxygen Saturation % 95 %     2 Minute Post Liters of Oxygen 0 L              Oxygen Initial Assessment:  Oxygen Initial Assessment - 10/19/22 1432       Home Oxygen   Home Oxygen Device Home Concentrator    Sleep Oxygen Prescription Continuous    Liters per minute 3    Home Exercise Oxygen Prescription None    Home Resting Oxygen Prescription None    Compliance with Home Oxygen Use No      Initial 6 min Walk   Oxygen Used None      Program Oxygen Prescription   Program Oxygen Prescription None      Intervention   Short Term Goals To learn and understand importance of monitoring SPO2 with pulse oximeter and demonstrate accurate use of the pulse oximeter.;To learn and understand importance of maintaining oxygen saturations>88%;To learn and demonstrate proper pursed lip breathing techniques or other breathing techniques.     Long  Term Goals Verbalizes importance of monitoring SPO2 with pulse oximeter and return demonstration;Maintenance of O2 saturations>88%;Exhibits proper breathing techniques, such as pursed lip breathing or other method taught during program session;Compliance with respiratory medication             Oxygen Re-Evaluation:  Oxygen Re-Evaluation     Row Name 11/03/22 0730 12/01/22 1454 12/29/22 1445 01/26/23 1040 03/15/23 1530      Program Oxygen Prescription   Program Oxygen Prescription None None None None None     Home Oxygen   Home Oxygen Device Home Concentrator Home Concentrator Home Concentrator Home Concentrator Home Concentrator   Sleep Oxygen Prescription Continuous Continuous Continuous Continuous Continuous   Liters per minute 3 3 3 3 3    Home Exercise Oxygen Prescription None None None None None   Home Resting Oxygen Prescription None None None None None   Compliance with Home Oxygen Use No No No No No  only uses it on occassion     Goals/Expected Outcomes   Short Term Goals To learn and understand importance of monitoring SPO2 with pulse oximeter and demonstrate accurate use of the pulse oximeter.;To learn and understand importance of maintaining oxygen saturations>88%;To learn and demonstrate proper pursed lip breathing techniques or other breathing techniques.  To learn and understand importance of monitoring SPO2 with pulse oximeter and demonstrate accurate use of the pulse oximeter.;To learn and demonstrate proper pursed lip breathing techniques or other breathing techniques. ;To learn and understand importance of maintaining oxygen saturations>88% To learn and understand importance of monitoring SPO2 with pulse oximeter and demonstrate accurate use of the pulse oximeter.;To learn and demonstrate proper pursed lip breathing techniques or other breathing techniques. ;To learn and understand importance of maintaining oxygen saturations>88% To learn and understand importance of monitoring SPO2 with pulse oximeter and demonstrate accurate use of the pulse oximeter.;To learn and demonstrate proper pursed lip breathing techniques or other breathing techniques. ;To learn and understand importance of maintaining oxygen saturations>88% To learn and understand importance of monitoring SPO2 with pulse oximeter and demonstrate accurate use of the pulse oximeter.;To learn and demonstrate proper pursed lip breathing techniques  or other breathing techniques. ;To learn and understand importance of maintaining oxygen saturations>88%;To learn and exhibit compliance with exercise, home and travel O2 prescription   Long  Term Goals Verbalizes importance of monitoring SPO2 with pulse oximeter and return demonstration;Maintenance of O2  saturations>88%;Exhibits proper breathing techniques, such as pursed lip breathing or other method taught during program session;Compliance with respiratory medication Verbalizes importance of monitoring SPO2 with pulse oximeter and return demonstration;Maintenance of O2 saturations>88%;Exhibits proper breathing techniques, such as pursed lip breathing or other method taught during program session;Compliance with respiratory medication Verbalizes importance of monitoring SPO2 with pulse oximeter and return demonstration;Maintenance of O2 saturations>88%;Exhibits proper breathing techniques, such as pursed lip breathing or other method taught during program session;Compliance with respiratory medication Verbalizes importance of monitoring SPO2 with pulse oximeter and return demonstration;Maintenance of O2 saturations>88%;Exhibits proper breathing techniques, such as pursed lip breathing or other method taught during program session;Compliance with respiratory medication Verbalizes importance of monitoring SPO2 with pulse oximeter and return demonstration;Maintenance of O2 saturations>88%;Exhibits proper breathing techniques, such as pursed lip breathing or other method taught during program session;Compliance with respiratory medication;Exhibits compliance with exercise, home  and travel O2 prescription   Comments -- -- -- -- Harriett Sine returned to rehab today. She does not always use her oxygen at night, but will most nights.  She has been watching her saturations more.  She is dealing with a cough.  She uses her nebulizer twice a day and finds it very helpful.  She continues to work on her PLB.   Goals/Expected  Outcomes compliance compliance compliance compliance Short: Continue to work on improving breathing Long: continue to manage pulmonary disease.            Oxygen Discharge (Final Oxygen Re-Evaluation):  Oxygen Re-Evaluation - 03/15/23 1530       Program Oxygen Prescription   Program Oxygen Prescription None      Home Oxygen   Home Oxygen Device Home Concentrator    Sleep Oxygen Prescription Continuous    Liters per minute 3    Home Exercise Oxygen Prescription None    Home Resting Oxygen Prescription None    Compliance with Home Oxygen Use No   only uses it on occassion     Goals/Expected Outcomes   Short Term Goals To learn and understand importance of monitoring SPO2 with pulse oximeter and demonstrate accurate use of the pulse oximeter.;To learn and demonstrate proper pursed lip breathing techniques or other breathing techniques. ;To learn and understand importance of maintaining oxygen saturations>88%;To learn and exhibit compliance with exercise, home and travel O2 prescription    Long  Term Goals Verbalizes importance of monitoring SPO2 with pulse oximeter and return demonstration;Maintenance of O2 saturations>88%;Exhibits proper breathing techniques, such as pursed lip breathing or other method taught during program session;Compliance with respiratory medication;Exhibits compliance with exercise, home  and travel O2 prescription    Comments Jeanette returned to rehab today. She does not always use her oxygen at night, but will most nights.  She has been watching her saturations more.  She is dealing with a cough.  She uses her nebulizer twice a day and finds it very helpful.  She continues to work on her PLB.    Goals/Expected Outcomes Short: Continue to work on improving breathing Long: continue to manage pulmonary disease.             Initial Exercise Prescription:  Initial Exercise Prescription - 10/19/22 1400       Date of Initial Exercise RX and Referring Provider    Date 10/19/22    Referring Provider Dr. Maia Plan    Expected Discharge Date 02/24/23      NuStep   Level 1    SPM 80    Minutes 39  Prescription Details   Frequency (times per week) 2    Duration Progress to 30 minutes of continuous aerobic without signs/symptoms of physical distress      Intensity   THRR 40-80% of Max Heartrate 59-118    Ratings of Perceived Exertion 11-13    Perceived Dyspnea 0-4      Resistance Training   Training Prescription Yes    Weight 2    Reps 10-15             Perform Capillary Blood Glucose checks as needed.  Exercise Prescription Changes:   Exercise Prescription Changes     Row Name 11/25/22 1500 12/02/22 1534 12/28/22 1500 01/11/23 1600 01/25/23 1500     Response to Exercise   Blood Pressure (Admit) 104/60 120/70 114/64 112/60 120/68   Blood Pressure (Exercise) 100/62 112/64 110/78 120/70 124/74   Blood Pressure (Exit) 106/60 126/70 124/76 130/62 124/64   Heart Rate (Admit) 86 bpm 92 bpm 86 bpm 92 bpm 76 bpm   Heart Rate (Exercise) 83 bpm 88 bpm 88 bpm 87 bpm 82 bpm   Heart Rate (Exit) 81 bpm 84 bpm 81 bpm 82 bpm 76 bpm   Oxygen Saturation (Admit) 92 % 92 % 94 % 92 % 95 %   Oxygen Saturation (Exercise) 91 % 90 % 91 % 92 % 93 %   Oxygen Saturation (Exit) 90 % 93 % 95 % 95 % 94 %   Rating of Perceived Exertion (Exercise) 11 11 13 12 12    Perceived Dyspnea (Exercise) 11 11 13 13 12    Duration Continue with 30 min of aerobic exercise without signs/symptoms of physical distress. Continue with 30 min of aerobic exercise without signs/symptoms of physical distress. Continue with 30 min of aerobic exercise without signs/symptoms of physical distress. Continue with 30 min of aerobic exercise without signs/symptoms of physical distress. Continue with 30 min of aerobic exercise without signs/symptoms of physical distress.   Intensity THRR unchanged THRR unchanged THRR unchanged THRR unchanged THRR unchanged     Progression    Progression Continue to progress workloads to maintain intensity without signs/symptoms of physical distress. Continue to progress workloads to maintain intensity without signs/symptoms of physical distress. Continue to progress workloads to maintain intensity without signs/symptoms of physical distress. Continue to progress workloads to maintain intensity without signs/symptoms of physical distress. Continue to progress workloads to maintain intensity without signs/symptoms of physical distress.     Resistance Training   Training Prescription Yes Yes Yes Yes Yes   Weight 2 2 2 2 2    Reps 10-15 10-15 10-15 10-15 10-15   Time 10 Minutes 10 Minutes 10 Minutes 10 Minutes 10 Minutes     NuStep   Level 1 1 1 1 2    SPM 47 46 50 51 48   Minutes 39 39 39 39 39   METs 1.8 1.7 1.8 1.7 1.7    Row Name 02/08/23 1500             Response to Exercise   Blood Pressure (Admit) 102/58       Blood Pressure (Exercise) 102/60       Blood Pressure (Exit) 100/60       Heart Rate (Admit) 94 bpm       Heart Rate (Exercise) 82 bpm       Heart Rate (Exit) 86 bpm       Oxygen Saturation (Admit) 93 %       Oxygen Saturation (Exercise) 92 %  Oxygen Saturation (Exit) 92 %       Rating of Perceived Exertion (Exercise) 12       Perceived Dyspnea (Exercise) 12       Duration Continue with 30 min of aerobic exercise without signs/symptoms of physical distress.       Intensity THRR unchanged         Progression   Progression Continue to progress workloads to maintain intensity without signs/symptoms of physical distress.         Resistance Training   Training Prescription Yes       Weight 2       Reps 10-15       Time 10 Minutes         NuStep   Level 2       SPM 54       Minutes 39       METs 1.7                Exercise Comments:   Exercise Goals and Review:   Exercise Goals     Row Name 10/19/22 1443 11/03/22 0730 12/01/22 1453 12/29/22 1443 01/26/23 1038     Exercise Goals    Increase Physical Activity Yes Yes Yes Yes Yes   Intervention Provide advice, education, support and counseling about physical activity/exercise needs.;Develop an individualized exercise prescription for aerobic and resistive training based on initial evaluation findings, risk stratification, comorbidities and participant's personal goals. Provide advice, education, support and counseling about physical activity/exercise needs.;Develop an individualized exercise prescription for aerobic and resistive training based on initial evaluation findings, risk stratification, comorbidities and participant's personal goals. Provide advice, education, support and counseling about physical activity/exercise needs.;Develop an individualized exercise prescription for aerobic and resistive training based on initial evaluation findings, risk stratification, comorbidities and participant's personal goals. Provide advice, education, support and counseling about physical activity/exercise needs.;Develop an individualized exercise prescription for aerobic and resistive training based on initial evaluation findings, risk stratification, comorbidities and participant's personal goals. Provide advice, education, support and counseling about physical activity/exercise needs.;Develop an individualized exercise prescription for aerobic and resistive training based on initial evaluation findings, risk stratification, comorbidities and participant's personal goals.   Expected Outcomes Short Term: Attend rehab on a regular basis to increase amount of physical activity.;Long Term: Add in home exercise to make exercise part of routine and to increase amount of physical activity.;Long Term: Exercising regularly at least 3-5 days a week. Short Term: Attend rehab on a regular basis to increase amount of physical activity.;Long Term: Add in home exercise to make exercise part of routine and to increase amount of physical activity.;Long Term:  Exercising regularly at least 3-5 days a week. Short Term: Attend rehab on a regular basis to increase amount of physical activity.;Long Term: Add in home exercise to make exercise part of routine and to increase amount of physical activity.;Long Term: Exercising regularly at least 3-5 days a week. Short Term: Attend rehab on a regular basis to increase amount of physical activity.;Long Term: Add in home exercise to make exercise part of routine and to increase amount of physical activity.;Long Term: Exercising regularly at least 3-5 days a week. Short Term: Attend rehab on a regular basis to increase amount of physical activity.;Long Term: Add in home exercise to make exercise part of routine and to increase amount of physical activity.;Long Term: Exercising regularly at least 3-5 days a week.   Increase Strength and Stamina Yes Yes Yes Yes Yes   Intervention Provide advice,  education, support and counseling about physical activity/exercise needs.;Develop an individualized exercise prescription for aerobic and resistive training based on initial evaluation findings, risk stratification, comorbidities and participant's personal goals. Provide advice, education, support and counseling about physical activity/exercise needs.;Develop an individualized exercise prescription for aerobic and resistive training based on initial evaluation findings, risk stratification, comorbidities and participant's personal goals. Provide advice, education, support and counseling about physical activity/exercise needs.;Develop an individualized exercise prescription for aerobic and resistive training based on initial evaluation findings, risk stratification, comorbidities and participant's personal goals. Provide advice, education, support and counseling about physical activity/exercise needs.;Develop an individualized exercise prescription for aerobic and resistive training based on initial evaluation findings, risk stratification,  comorbidities and participant's personal goals. Provide advice, education, support and counseling about physical activity/exercise needs.;Develop an individualized exercise prescription for aerobic and resistive training based on initial evaluation findings, risk stratification, comorbidities and participant's personal goals.   Expected Outcomes Short Term: Increase workloads from initial exercise prescription for resistance, speed, and METs.;Short Term: Perform resistance training exercises routinely during rehab and add in resistance training at home;Long Term: Improve cardiorespiratory fitness, muscular endurance and strength as measured by increased METs and functional capacity ( ) Short Term: Increase workloads from initial exercise prescription for resistance, speed, and METs.;Short Term: Perform resistance training exercises routinely during rehab and add in resistance training at home;Long Term: Improve cardiorespiratory fitness, muscular endurance and strength as measured by increased METs and functional capacity ( ) Short Term: Increase workloads from initial exercise prescription for resistance, speed, and METs.;Short Term: Perform resistance training exercises routinely during rehab and add in resistance training at home;Long Term: Improve cardiorespiratory fitness, muscular endurance and strength as measured by increased METs and functional capacity ( ) Short Term: Increase workloads from initial exercise prescription for resistance, speed, and METs.;Short Term: Perform resistance training exercises routinely during rehab and add in resistance training at home;Long Term: Improve cardiorespiratory fitness, muscular endurance and strength as measured by increased METs and functional capacity ( ) Short Term: Increase workloads from initial exercise prescription for resistance, speed, and METs.;Short Term: Perform resistance training exercises routinely during rehab and add in resistance  training at home;Long Term: Improve cardiorespiratory fitness, muscular endurance and strength as measured by increased METs and functional capacity ( )   Able to understand and use rate of perceived exertion (RPE) scale Yes Yes Yes Yes Yes   Intervention Provide education and explanation on how to use RPE scale Provide education and explanation on how to use RPE scale Provide education and explanation on how to use RPE scale Provide education and explanation on how to use RPE scale Provide education and explanation on how to use RPE scale   Expected Outcomes Short Term: Able to use RPE daily in rehab to express subjective intensity level;Long Term:  Able to use RPE to guide intensity level when exercising independently Short Term: Able to use RPE daily in rehab to express subjective intensity level;Long Term:  Able to use RPE to guide intensity level when exercising independently Short Term: Able to use RPE daily in rehab to express subjective intensity level;Long Term:  Able to use RPE to guide intensity level when exercising independently Short Term: Able to use RPE daily in rehab to express subjective intensity level;Long Term:  Able to use RPE to guide intensity level when exercising independently Short Term: Able to use RPE daily in rehab to express subjective intensity level;Long Term:  Able to use RPE to guide intensity level when exercising independently   Able to understand and  use Dyspnea scale Yes Yes Yes Yes Yes   Intervention Provide education and explanation on how to use Dyspnea scale Provide education and explanation on how to use Dyspnea scale Provide education and explanation on how to use Dyspnea scale Provide education and explanation on how to use Dyspnea scale Provide education and explanation on how to use Dyspnea scale   Expected Outcomes Short Term: Able to use Dyspnea scale daily in rehab to express subjective sense of shortness of breath during exertion;Long Term: Able to use  Dyspnea scale to guide intensity level when exercising independently Short Term: Able to use Dyspnea scale daily in rehab to express subjective sense of shortness of breath during exertion;Long Term: Able to use Dyspnea scale to guide intensity level when exercising independently Short Term: Able to use Dyspnea scale daily in rehab to express subjective sense of shortness of breath during exertion;Long Term: Able to use Dyspnea scale to guide intensity level when exercising independently Short Term: Able to use Dyspnea scale daily in rehab to express subjective sense of shortness of breath during exertion;Long Term: Able to use Dyspnea scale to guide intensity level when exercising independently Short Term: Able to use Dyspnea scale daily in rehab to express subjective sense of shortness of breath during exertion;Long Term: Able to use Dyspnea scale to guide intensity level when exercising independently   Knowledge and understanding of Target Heart Rate Range (THRR) Yes Yes Yes Yes Yes   Intervention Provide education and explanation of THRR including how the numbers were predicted and where they are located for reference Provide education and explanation of THRR including how the numbers were predicted and where they are located for reference Provide education and explanation of THRR including how the numbers were predicted and where they are located for reference Provide education and explanation of THRR including how the numbers were predicted and where they are located for reference Provide education and explanation of THRR including how the numbers were predicted and where they are located for reference   Expected Outcomes Short Term: Able to state/look up THRR;Short Term: Able to use daily as guideline for intensity in rehab;Long Term: Able to use THRR to govern intensity when exercising independently Short Term: Able to state/look up THRR;Short Term: Able to use daily as guideline for intensity in  rehab;Long Term: Able to use THRR to govern intensity when exercising independently Short Term: Able to state/look up THRR;Short Term: Able to use daily as guideline for intensity in rehab;Long Term: Able to use THRR to govern intensity when exercising independently Short Term: Able to state/look up THRR;Short Term: Able to use daily as guideline for intensity in rehab;Long Term: Able to use THRR to govern intensity when exercising independently Short Term: Able to state/look up THRR;Short Term: Able to use daily as guideline for intensity in rehab;Long Term: Able to use THRR to govern intensity when exercising independently   Understanding of Exercise Prescription Yes Yes Yes Yes Yes   Intervention Provide education, explanation, and written materials on patient's individual exercise prescription Provide education, explanation, and written materials on patient's individual exercise prescription Provide education, explanation, and written materials on patient's individual exercise prescription Provide education, explanation, and written materials on patient's individual exercise prescription Provide education, explanation, and written materials on patient's individual exercise prescription   Expected Outcomes Long Term: Able to explain home exercise prescription to exercise independently;Short Term: Able to explain program exercise prescription Long Term: Able to explain home exercise prescription to exercise independently;Short Term: Able to  explain program exercise prescription Long Term: Able to explain home exercise prescription to exercise independently;Short Term: Able to explain program exercise prescription Long Term: Able to explain home exercise prescription to exercise independently;Short Term: Able to explain program exercise prescription Long Term: Able to explain home exercise prescription to exercise independently;Short Term: Able to explain program exercise prescription            Exercise  Goals Re-Evaluation :  Exercise Goals Re-Evaluation     Row Name 11/03/22 0731 12/01/22 1453 12/29/22 1443 01/26/23 1038 03/15/23 1517     Exercise Goal Re-Evaluation   Exercise Goals Review Increase Physical Activity;Able to understand and use rate of perceived exertion (RPE) scale;Increase Strength and Stamina;Able to understand and use Dyspnea scale;Knowledge and understanding of Target Heart Rate Range (THRR);Understanding of Exercise Prescription Increase Physical Activity;Increase Strength and Stamina;Able to understand and use rate of perceived exertion (RPE) scale;Able to understand and use Dyspnea scale;Knowledge and understanding of Target Heart Rate Range (THRR);Understanding of Exercise Prescription Increase Physical Activity;Increase Strength and Stamina;Able to understand and use rate of perceived exertion (RPE) scale;Able to understand and use Dyspnea scale;Knowledge and understanding of Target Heart Rate Range (THRR);Understanding of Exercise Prescription Increase Physical Activity;Increase Strength and Stamina;Able to understand and use rate of perceived exertion (RPE) scale;Knowledge and understanding of Target Heart Rate Range (THRR);Able to check pulse independently;Understanding of Exercise Prescription Increase Physical Activity;Increase Strength and Stamina;Understanding of Exercise Prescription   Comments Pt has not attended a PR session yet. Her daughter reports that she has pneumonia, so she has missed her first two weeks. She is hopeful to begin 4/2. Unable to assess at this time due to lack of attendance. Pt has completed 2 sessions of PR. She is deconditioned and will progress slowly in  the program. She is currently exercising at 1.8 METs on  the stepper. Will continue to monitor and progress as able., Pt has completed 5 sessions of PR. She is deconditioned and will progress slowly. She has started to increase her SPM on the stepper which is a small goal. She is currently  exercising at 1.8 METs on the stepper. Will continue to monitor and progress as able . Pt has completed 9 sessions of PR. SHe continues to be deconditioned but has started to progress by increasing her level on the stepper. She is still has low SPM. She sometimes misses classes due to transpertation. She is currenttly exercising at 1.7 METs on the stepper. Will continue to monitor and progress as able. Glenis returned to rehab today.  She has been out since 02/17/23 dealing with low blood pressures.  She has not been able to exercise as much due to not feeling as well with low pressures.   Expected Outcomes Through exercise at rehab and at home, the patient will meet their stated goals. Through exercise at rehab and at home, the patient will meet their stated goals. Through exercise at rehab and at home, the patient will meet their stated goals. Through exercise at rehab and at home, the patient will meet their stated goals. Short: Get back to routine exercise Long: Continue to improve stamina            Discharge Exercise Prescription (Final Exercise Prescription Changes):  Exercise Prescription Changes - 02/08/23 1500       Response to Exercise   Blood Pressure (Admit) 102/58    Blood Pressure (Exercise) 102/60    Blood Pressure (Exit) 100/60    Heart Rate (Admit) 94 bpm  Heart Rate (Exercise) 82 bpm    Heart Rate (Exit) 86 bpm    Oxygen Saturation (Admit) 93 %    Oxygen Saturation (Exercise) 92 %    Oxygen Saturation (Exit) 92 %    Rating of Perceived Exertion (Exercise) 12    Perceived Dyspnea (Exercise) 12    Duration Continue with 30 min of aerobic exercise without signs/symptoms of physical distress.    Intensity THRR unchanged      Progression   Progression Continue to progress workloads to maintain intensity without signs/symptoms of physical distress.      Resistance Training   Training Prescription Yes    Weight 2    Reps 10-15    Time 10 Minutes      NuStep   Level 2     SPM 54    Minutes 39    METs 1.7             Nutrition:  Target Goals: Understanding of nutrition guidelines, daily intake of sodium 1500mg , cholesterol 200mg , calories 30% from fat and 7% or less from saturated fats, daily to have 5 or more servings of fruits and vegetables.  Biometrics:  Pre Biometrics - 10/19/22 1450       Pre Biometrics   Height 5' (1.524 m)    Weight 171 lb 15.3 oz (78 kg)    Waist Circumference 44.5 inches    Hip Circumference 45.5 inches    Waist to Hip Ratio 0.98 %    BMI (Calculated) 33.58    Triceps Skinfold 43 mm    % Body Fat 49.1 %    Grip Strength 43 kg    Flexibility 0 in    Single Leg Stand 0 seconds              Nutrition Therapy Plan and Nutrition Goals:  Nutrition Therapy & Goals - 02/14/23 1408       Nutrition Therapy   RD appointment deferred Yes      Personal Nutrition Goals   Comments We provide educational material on heart healthy nutrition with handouts.      Intervention Plan   Intervention Nutrition handout(s) given to patient.    Expected Outcomes Short Term Goal: Understand basic principles of dietary content, such as calories, fat, sodium, cholesterol and nutrients.             Nutrition Assessments:  Nutrition Assessments - 10/19/22 1315       MEDFICTS Scores   Pre Score 45            MEDIFICTS Score Key: ?70 Need to make dietary changes  40-70 Heart Healthy Diet ? 40 Therapeutic Level Cholesterol Diet   Picture Your Plate Scores: <95 Unhealthy dietary pattern with much room for improvement. 41-50 Dietary pattern unlikely to meet recommendations for good health and room for improvement. 51-60 More healthful dietary pattern, with some room for improvement.  >60 Healthy dietary pattern, although there may be some specific behaviors that could be improved.    Nutrition Goals Re-Evaluation:  Nutrition Goals Re-Evaluation     Row Name 03/15/23 1521             Goals    Nutrition Goal Mechanical Eating       Comment Elmire is working on her diet.  She has lost her appetitie and not hungry.  We talked about mechanical eating even when not hungry or using meal replacement shakes.  We talked about not wanting to go into caxeia and  she needs the protein to not continue to get weaker.  She loves ice cream and was encouraged to get more of it.       Expected Outcome Short: Try protein shakes Long: Continue to use mechanical eating to eat more                Nutrition Goals Discharge (Final Nutrition Goals Re-Evaluation):  Nutrition Goals Re-Evaluation - 03/15/23 1521       Goals   Nutrition Goal Mechanical Eating    Comment Cassidie is working on her diet.  She has lost her appetitie and not hungry.  We talked about mechanical eating even when not hungry or using meal replacement shakes.  We talked about not wanting to go into caxeia and she needs the protein to not continue to get weaker.  She loves ice cream and was encouraged to get more of it.    Expected Outcome Short: Try protein shakes Long: Continue to use mechanical eating to eat more             Psychosocial: Target Goals: Acknowledge presence or absence of significant depression and/or stress, maximize coping skills, provide positive support system. Participant is able to verbalize types and ability to use techniques and skills needed for reducing stress and depression.  Initial Review & Psychosocial Screening:  Initial Psych Review & Screening - 10/19/22 1301       Initial Review   Current issues with Current Depression;Current Sleep Concerns      Family Dynamics   Good Support System? Yes    Comments Her son, daugher, son in law, grandson, and sister support her.      Barriers   Psychosocial barriers to participate in program There are no identifiable barriers or psychosocial needs.      Screening Interventions   Interventions Encouraged to exercise;Provide feedback about the scores to  participant    Expected Outcomes Long Term goal: The participant improves quality of Life and PHQ9 Scores as seen by post scores and/or verbalization of changes;Short Term goal: Identification and review with participant of any Quality of Life or Depression concerns found by scoring the questionnaire.             Quality of Life Scores:  Quality of Life - 10/19/22 1432       Quality of Life   Select Quality of Life      Quality of Life Scores   Health/Function Pre 13.13 %    Socioeconomic Pre 26.92 %    Psych/Spiritual Pre 26.57 %    Family Pre 30 %    GLOBAL Pre 20.77 %            Scores of 19 and below usually indicate a poorer quality of life in these areas.  A difference of  2-3 points is a clinically meaningful difference.  A difference of 2-3 points in the total score of the Quality of Life Index has been associated with significant improvement in overall quality of life, self-image, physical symptoms, and general health in studies assessing change in quality of life.   PHQ-9: Review Flowsheet       03/15/2023 10/19/2022  Depression screen PHQ 2/9  Decreased Interest 3 1  Down, Depressed, Hopeless 1 1  PHQ - 2 Score 4 2  Altered sleeping 3 3  Tired, decreased energy 3 3  Change in appetite 3 1  Feeling bad or failure about yourself  1 1  Trouble concentrating 1 1  Moving  slowly or fidgety/restless 1 0  Suicidal thoughts 1 0  PHQ-9 Score 17 11  Difficult doing work/chores Somewhat difficult Somewhat difficult    Details           Interpretation of Total Score  Total Score Depression Severity:  1-4 = Minimal depression, 5-9 = Mild depression, 10-14 = Moderate depression, 15-19 = Moderately severe depression, 20-27 = Severe depression   Psychosocial Evaluation and Intervention:  Psychosocial Evaluation - 10/19/22 1454       Psychosocial Evaluation & Interventions   Interventions Stress management education;Relaxation education;Encouraged to exercise  with the program and follow exercise prescription    Comments Pt has no barriers to participate in PR. She has no identifiable psychosocial issues. She does have depression, but she states that it is well managed with buspirone 20 mg twice daily and citalopram 20 mg once daily. Her husband passed away back in 12/26/2021, but she reports that she has been able to cope well with this. She is with her daughter, Joni Reining, today. Joni Reining is her primary caregiver and takes her to all of her appointments. Pt also reports problems with her sleep, but she does not take anything for her sleep. She scored a 14 on her PHQ-9, and most of this relates to her depression and the effects of her COPD. She reports not being able to go out much due to her SOB. Outside of her daughter, she also lists her son, son in law, grandson, and sister as her support system. Her goals while in the program are to decrease her SOB with exertion and to gain her confidence back. She would like to be more confident that she can do activities such as shopping or walking up stairs without becoming SOB. She is eager to start the program.    Expected Outcomes Pt's depression will continue to be treated, and she will have no other identifiable psychosocial issues.    Continue Psychosocial Services  No Follow up required             Psychosocial Re-Evaluation:  Psychosocial Re-Evaluation     Row Name 10/25/22 4098 11/19/22 1339 12/20/22 1431 01/17/23 1301 02/14/23 1409     Psychosocial Re-Evaluation   Current issues with History of Depression;None Identified;Current Psychotropic Meds History of Depression;None Identified;Current Psychotropic Meds History of Depression;Current Psychotropic Meds History of Depression;Current Psychotropic Meds History of Depression;Current Psychotropic Meds   Comments Patient has not started class yet, plans to this week.  Her initial PHQ-7 score is 14 and QOL score is 20.77%.  Patient was referred to PR with  COPD via Dr. Renaldo Reel.  Her personal goals are to decrease her SOB with exertion, go up stairs without SOB and gain confidence to go back shopping. Patient plans to start soon.  Her initial PHQ-7 score is 14 and QOL score is 20.77%.  Patient was referred to PR with COPD via Dr. Renaldo Reel.  Her personal goals are to decrease her SOB with exertion, go up stairs without SOB and gain confidence to go back shopping. Patient has started with attending only 3 sessions.  Her attendance is limited due to transportation problems.  Patient was referred to PR with COPD via Dr. Renaldo Reel.  She reports having a history of depression that is managed with Buspar and citalopram.  She seems to enjoy coming to class and interactive with others in class. We will contiue to monitor her as she progresses in the program. Patient has completed 7th sessions.  Her attendance is  limited due to transportation problems.  Patient was referred to PR with COPD via Dr. Renaldo Reel.  She reports having a history of depression that is managed with Buspar and citalopram.  She seems to enjoy coming to class.  She continues to demonstrate a positive outlook and interactive with class and staff. We will contiue to monitor her as she progresses in the program. Patient has completed 12 sessions. Transportation is a barrier to her being able to attend. There are no other physhosocial barriers identified. Patient's depression continues to be managed with Buspar and citalopram.  She continues to  enjoy coming the sessions.  She continues to demonstrate a positive outlook and interactive with class and staff. We will contiue to monitor her as she progresses in the program.   Expected Outcomes Patient will have no psychosocial issuses identified at discharge. Patient will have no psychosocial issuses identified at discharge. Patient will have no psychosocial issuses identified at discharge. Patient will have no psychosocial issuses identified at discharge. Patient will have no  psychosocial issuses identified at discharge.   Interventions Stress management education;Relaxation education;Encouraged to attend Pulmonary Rehabilitation for the exercise Stress management education;Relaxation education;Encouraged to attend Pulmonary Rehabilitation for the exercise Stress management education;Relaxation education;Encouraged to attend Pulmonary Rehabilitation for the exercise Stress management education;Relaxation education;Encouraged to attend Pulmonary Rehabilitation for the exercise Stress management education;Relaxation education;Encouraged to attend Pulmonary Rehabilitation for the exercise   Continue Psychosocial Services  No Follow up required No Follow up required No Follow up required No Follow up required No Follow up required    Row Name 03/15/23 1524             Psychosocial Re-Evaluation   Current issues with History of Depression;Current Psychotropic Meds       Comments Aeva has been out dealing with low blood pressure issues. She had enjoyed her vacation, but came back to her pressures running too low.  She fatigues quickly and easily frustrated by it.  She lost her husband recently and she is still adjusting to that and has days that are harder than others.  Her PHQ was 17. She is on celexa to help. She continues to grieve her husband.       Expected Outcomes Short: Get back to routine exercise for mental boost Long: Continue to grieve for husband.       Interventions Stress management education;Encouraged to attend Pulmonary Rehabilitation for the exercise       Continue Psychosocial Services  Follow up required by staff                Psychosocial Discharge (Final Psychosocial Re-Evaluation):  Psychosocial Re-Evaluation - 03/15/23 1524       Psychosocial Re-Evaluation   Current issues with History of Depression;Current Psychotropic Meds    Comments Calvina has been out dealing with low blood pressure issues. She had enjoyed her vacation, but came back  to her pressures running too low.  She fatigues quickly and easily frustrated by it.  She lost her husband recently and she is still adjusting to that and has days that are harder than others.  Her PHQ was 17. She is on celexa to help. She continues to grieve her husband.    Expected Outcomes Short: Get back to routine exercise for mental boost Long: Continue to grieve for husband.    Interventions Stress management education;Encouraged to attend Pulmonary Rehabilitation for the exercise    Continue Psychosocial Services  Follow up required by staff  Education: Education Goals: Education classes will be provided on a weekly basis, covering required topics. Participant will state understanding/return demonstration of topics presented.  Learning Barriers/Preferences:  Learning Barriers/Preferences - 10/19/22 1326       Learning Barriers/Preferences   Learning Barriers None    Learning Preferences Written Material             Education Topics: How Lungs Work and Diseases: - Discuss the anatomy of the lungs and diseases that can affect the lungs, such as COPD.   Exercise: -Discuss the importance of exercise, FITT principles of exercise, normal and abnormal responses to exercise, and how to exercise safely.   Environmental Irritants: -Discuss types of environmental irritants and how to limit exposure to environmental irritants. Flowsheet Row PULMONARY REHAB OTHER RESPIRATORY from 02/17/2023 in [place] [facility]  Date 02/17/23  Educator handout       Meds/Inhalers and oxygen: - Discuss respiratory medications, definition of an inhaler and oxygen, and the proper way to use an inhaler and oxygen. Flowsheet Row PULMONARY REHAB OTHER RESPIRATORY from 02/17/2023 in [place] [facility]  Date 11/25/22  Educator handout       Energy Saving Techniques: - Discuss methods to conserve energy and decrease shortness of breath when  performing activities of daily living.  Flowsheet Row PULMONARY REHAB OTHER RESPIRATORY from 02/17/2023 in [facility]  Date 12/02/22  Educator handout       Bronchial Hygiene / Breathing Techniques: - Discuss breathing mechanics, pursed-lip breathing technique,  proper posture, effective ways to clear airways, and other functional breathing techniques   Cleaning Equipment: - Provides group verbal and written instruction about the health risks of elevated stress, cause of high stress, and healthy ways to reduce stress.   Nutrition I: Fats: - Discuss the types of cholesterol, what cholesterol does to the body, and how cholesterol levels can be controlled. Flowsheet Row PULMONARY REHAB OTHER RESPIRATORY from 02/17/2023 in [facility]  Date 12/23/22  Educator handout       Nutrition II: Labels: -Discuss the different components of food labels and how to read food labels.   Respiratory Infections: - Discuss the signs and symptoms of respiratory infections, ways to prevent respiratory infections, and the importance of seeking medical treatment when having a respiratory infection.   Stress I: Signs and Symptoms: - Discuss the causes of stress, how stress may lead to anxiety and depression, and ways to limit stress.   Stress II: Relaxation: -Discuss relaxation techniques to limit stress. Flowsheet Row PULMONARY REHAB OTHER RESPIRATORY from 02/17/2023 in [facility]  Date 01/27/23  Educator handout       Oxygen for Home/Travel: - Discuss how to prepare for travel when on oxygen and proper ways to transport and store oxygen to ensure safety. Flowsheet Row PULMONARY REHAB OTHER RESPIRATORY from 02/17/2023 in [facility]  Date 02/03/23  Educator handout       Knowledge Questionnaire Score:  Knowledge Questionnaire Score - 10/19/22 1327       Knowledge Questionnaire Score   Pre Score  0/18, did not complete, but reviewed all correct answers with patient             Core Components/Risk Factors/Patient Goals at Admission:  Personal Goals and Risk Factors at Admission - 10/19/22 1336       Core Components/Risk Factors/Patient Goals on Admission   Improve shortness of breath with ADL's Yes    Intervention Provide education, individualized exercise  plan and daily activity instruction to help decrease symptoms of SOB with activities of daily living.    Expected Outcomes Short Term: Improve cardiorespiratory fitness to achieve a reduction of symptoms when performing ADLs;Long Term: Be able to perform more ADLs without symptoms or delay the onset of symptoms    Increase knowledge of respiratory medications and ability to use respiratory devices properly  Yes    Intervention Provide education and demonstration as needed of appropriate use of medications, inhalers, and oxygen therapy.    Expected Outcomes Short Term: Achieves understanding of medications use. Understands that oxygen is a medication prescribed by physician. Demonstrates appropriate use of inhaler and oxygen therapy.;Long Term: Maintain appropriate use of medications, inhalers, and oxygen therapy.    Diabetes Yes    Intervention Provide education about signs/symptoms and action to take for hypo/hyperglycemia.;Provide education about proper nutrition, including hydration, and aerobic/resistive exercise prescription along with prescribed medications to achieve blood glucose in normal ranges: Fasting glucose 65-99 mg/dL    Expected Outcomes Short Term: Participant verbalizes understanding of the signs/symptoms and immediate care of hyper/hypoglycemia, proper foot care and importance of medication, aerobic/resistive exercise and nutrition plan for blood glucose control.;Long Term: Attainment of HbA1C < 7%.    Personal Goal Other Yes    Personal Goal Increase confidence in doing things for herself and going out more.     Intervention Attend PR two days per week and begin a home exercise program.    Expected Outcomes Pt will meet stated goals.             Core Components/Risk Factors/Patient Goals Review:   Goals and Risk Factor Review     Row Name 10/25/22 0941 11/19/22 1342 12/20/22 1443 01/17/23 1307 02/14/23 1411     Core Components/Risk Factors/Patient Goals Review   Personal Goals Review Improve shortness of breath with ADL's;Develop more efficient breathing techniques such as purse lipped breathing and diaphragmatic breathing and practicing self-pacing with activity.;Other Improve shortness of breath with ADL's;Develop more efficient breathing techniques such as purse lipped breathing and diaphragmatic breathing and practicing self-pacing with activity.;Other Improve shortness of breath with ADL's;Develop more efficient breathing techniques such as purse lipped breathing and diaphragmatic breathing and practicing self-pacing with activity.;Other Improve shortness of breath with ADL's;Develop more efficient breathing techniques such as purse lipped breathing and diaphragmatic breathing and practicing self-pacing with activity.;Other Improve shortness of breath with ADL's;Develop more efficient breathing techniques such as purse lipped breathing and diaphragmatic breathing and practicing self-pacing with activity.;Other   Review Patient plans to start program soon.  Patient referred to PR with COPD via Dr. Renaldo Reel.  Her personal goals for the program is to decrease SOB with exertion, go up stairs without SOB and gain confidence to go back shopping without SOB.  We will continue to monitor her progress as she works toward meeting these goals. Patient plans to start program soon.  Patient referred to PR with COPD via Dr. Renaldo Reel.  Her personal goals for the program is to decrease SOB with exertion, go up stairs without SOB and gain confidence to go back shopping without SOB.  We will continue to monitor her progress as  she works toward meeting these goals. Patient has started program with only attending 3 sessions. Her low attendance is due to transportation problems.  Her personal goals for the program is to decrease SOB with exertion, go up stairs without SOB and gain confidence to go back shopping without SOB.  We will help her develpe a efficient  breathing technique, such as purse lipped breathing and practice self pacing with increased activity.  We will continue to monitor her progress as she works toward meeting these goals. Patient has completed 7th sessions. Her low attendance is due to transportation problems.  Her personal goals for the program is to decrease SOB with exertion, go up stairs without SOB and gain confidence to go back shopping without SOB.  We will help her develope a efficient breathing technique, such as purse lipped breathing and practice self pacing with increased activity. Her O2 sats average between 90%-94% without  any supplemental oxygen needed at  this time.  We will continue to monitor her progress as she works toward meeting these goals. Patient has completed 12sessions. She is doing well in the program with inconsistent attendance due to transportation. Her current weight is 75.3 KG down 1 KG from last 30 day review. She exercises on RA with O2 sats averaging between 90%-92%. Her personal goals for the program is to decrease SOB with exertion, go up stairs without SOB and gain confidence to go back shopping without SOB.We will continue to monitor her progress as she works toward meeting these goals.   Expected Outcomes Patient will complete the program meeting both program and personal goals. Patient will complete the program meeting both program and personal goals. Patient will complete the program meeting both program and personal goals. Patient will complete the program meeting both program and personal goals. Patient will complete the program meeting both program and personal goals.     Row Name 03/15/23 1519             Core Components/Risk Factors/Patient Goals Review   Personal Goals Review Weight Management/Obesity;Hypertension;Improve shortness of breath with ADL's;Increase knowledge of respiratory medications and ability to use respiratory devices properly.       Review Azia returned to rehab after being out since 02/17/23  with low blood pressures.  She has not been feeling like herself with the low pressure and the doctors have told her to just keep moving and exercising.  She is also dealing with a cough.  She is losing weight from not eating and not being hungry. She has been struggling with her breathing more and with coughing more too.  She now has cough syrup to help.  She has been using her nebulizer and her daughter sets it up for her twice a day.  She is also using her inhalers daily too.       Expected Outcomes Short:Continue to keep close eye on pressures Long: Get back to routine exercise                Core Components/Risk Factors/Patient Goals at Discharge (Final Review):   Goals and Risk Factor Review - 03/15/23 1519       Core Components/Risk Factors/Patient Goals Review   Personal Goals Review Weight Management/Obesity;Hypertension;Improve shortness of breath with ADL's;Increase knowledge of respiratory medications and ability to use respiratory devices properly.    Review Tiernan returned to rehab after being out since 02/17/23  with low blood pressures.  She has not been feeling like herself with the low pressure and the doctors have told her to just keep moving and exercising.  She is also dealing with a cough.  She is losing weight from not eating and not being hungry. She has been struggling with her breathing more and with coughing more too.  She now has cough syrup to help.  She has been using her nebulizer  and her daughter sets it up for her twice a day.  She is also using her inhalers daily too.    Expected Outcomes Short:Continue to keep close  eye on pressures Long: Get back to routine exercise             ITP Comments:  ITP Comments     Row Name 02/23/23 1615 03/23/23 1522         ITP Comments 30 day review completed. ITP sent to Dr.Jehanzeb Memon, Medical Director of  Pulmonary Rehab. Continue with ITP unless changes are made by physician. 30 day review completed. ITP sent to Dr.Jehanzeb Memon, Medical Director of  Pulmonary Rehab. Continue with ITP unless changes are made by physician.               Comments: 30 day review

## 2023-03-24 ENCOUNTER — Encounter (HOSPITAL_COMMUNITY): Payer: Medicare Other

## 2023-03-29 ENCOUNTER — Encounter (HOSPITAL_COMMUNITY)
Admission: RE | Admit: 2023-03-29 | Discharge: 2023-03-29 | Disposition: A | Discharge status: Home / Self Care | Payer: Medicare Other | Source: Ambulatory Visit | Attending: Internal Medicine | Admitting: Internal Medicine

## 2023-03-29 ENCOUNTER — Encounter (HOSPITAL_COMMUNITY): Payer: Medicare Other

## 2023-03-29 DIAGNOSIS — J449 Chronic obstructive pulmonary disease, unspecified: Secondary | ICD-10-CM

## 2023-03-29 NOTE — Progress Notes (Signed)
Daily Session Note  Patient Details  Name: Kimberly Graves MRN: [id number] Date of Birth: 11-Oct-1950 Referring Provider:   Flowsheet Row PULMONARY REHAB COPD ORIENTATION from 10/19/2022 in [facility]  Referring Provider Dr. Maia Plan       Encounter Date: 03/29/2023  Check In:  Session Check In - 03/29/23 1440       Check-In   Supervising physician immediately available to respond to emergencies See telemetry face sheet for immediately available MD    Location [facility]    Staff Present Ross Ludwig, BS, Exercise Physiologist;Phyllis Billingsley, RN;Jessica Staint Clair, MA, RCEP, CCRP, Harolyn Rutherford, RN, BSN    Virtual Visit No    Medication changes reported     No    Fall or balance concerns reported    Yes    Comments She has fallen several times over the past year and she uses a walker.    Tobacco Cessation No Change    Warm-up and Cool-down Performed on first and last piece of equipment    Resistance Training Performed Yes    VAD Patient? No      Pain Assessment   Currently in Pain? No/denies    Pain Score 0-No pain             Capillary Blood Glucose: No results found for this or any previous visit (from the past 24 hour(s)).    Social History   Tobacco Use  Smoking Status Former   Current packs/day: 0.00   Types: Cigarettes   Quit date: 2007   Years since quitting: 17.6   Passive exposure: Past  Smokeless Tobacco Not on file    Goals Met:  Proper associated with RPD/PD & O2 Sat Independence with exercise equipment Using PLB without cueing & demonstrates good technique Exercise tolerated well Queuing for purse lip breathing No report of concerns or symptoms today Strength training completed today  Goals Unmet:  Not Applicable  Comments: Pt able to follow exercise prescription today without complaint.  Will continue to monitor for progression.    Dr. Erick Blinks is Medical Director for  [facility].

## 2023-03-31 ENCOUNTER — Encounter (HOSPITAL_COMMUNITY)
Admission: RE | Admit: 2023-03-31 | Discharge: 2023-03-31 | Disposition: A | Discharge status: Home / Self Care | Payer: Medicare Other | Source: Ambulatory Visit | Attending: Internal Medicine | Admitting: Internal Medicine

## 2023-03-31 ENCOUNTER — Encounter (HOSPITAL_COMMUNITY): Payer: Medicare Other

## 2023-03-31 DIAGNOSIS — J449 Chronic obstructive pulmonary disease, unspecified: Secondary | ICD-10-CM

## 2023-03-31 NOTE — Progress Notes (Signed)
Daily Session Note  Patient Details  Name: Kimberly Graves MRN: [id number] Date of Birth: 06-10-1951 Referring Provider:   Flowsheet Row PULMONARY REHAB COPD ORIENTATION from 10/19/2022 in [facility]  Referring Provider Dr. Maia Plan       Encounter Date: 03/31/2023  Check In:  Session Check In - 03/31/23 1425       Check-In   Supervising physician immediately available to respond to emergencies See telemetry face sheet for immediately available ER MD    Location [facility]    Staff Present Kimberly Medin, RN, BSN;Kimberly Graves BSN, RN;Kimberly Graves, BS, Exercise Physiologist    Virtual Visit No    Medication changes reported     No    Fall or balance concerns reported    Yes    Comments She has fallen several times over the past year and she uses a walker.    Warm-up and Cool-down Performed on first and last piece of equipment    Resistance Training Performed Yes    VAD Patient? No    PAD/SET Patient? No      Pain Assessment   Currently in Pain? No/denies    Pain Score 0-No pain    Multiple Pain Sites No             Capillary Blood Glucose: No results found for this or any previous visit (from the past 24 hour(s)).    Social History   Tobacco Use  Smoking Status Former   Current packs/day: 0.00   Types: Cigarettes   Quit date: 2007   Years since quitting: 17.6   Passive exposure: Past  Smokeless Tobacco Not on file    Goals Met:  Proper associated with RPD/PD & O2 Sat Independence with exercise equipment Using PLB without cueing & demonstrates good technique Exercise tolerated well No report of concerns or symptoms today Strength training completed today  Goals Unmet:  Not Applicable  Comments: Pt able to follow exercise prescription today without complaint.  Will continue to monitor for progression.    Dr. Erick Graves is Medical Director for [facility].

## 2023-04-05 ENCOUNTER — Encounter (HOSPITAL_COMMUNITY): Payer: Medicare Other

## 2023-04-07 ENCOUNTER — Encounter (HOSPITAL_COMMUNITY): Payer: Medicare Other

## 2023-04-12 ENCOUNTER — Encounter (HOSPITAL_COMMUNITY)
Admission: RE | Admit: 2023-04-12 | Discharge: 2023-04-12 | Disposition: A | Discharge status: Home / Self Care | Payer: Medicare Other | Source: Ambulatory Visit | Attending: Internal Medicine | Admitting: Internal Medicine

## 2023-04-12 DIAGNOSIS — J449 Chronic obstructive pulmonary disease, unspecified: Secondary | ICD-10-CM | POA: Diagnosis present

## 2023-04-12 NOTE — Progress Notes (Signed)
Daily Session Note  Patient Details  Name: Kimberly Graves MRN: [id number] Date of Birth: Jan 02, 1951 Referring Provider:   Flowsheet Row PULMONARY REHAB COPD ORIENTATION from 10/19/2022 in [facility]  Referring Provider Dr. Maia Plan       Encounter Date: 04/12/2023  Check In:  Session Check In - 04/12/23 1445       Check-In   Supervising physician immediately available to respond to emergencies See telemetry face sheet for immediately available MD    Location [facility]    Staff Present Ross Ludwig, BS, Exercise Physiologist;Larin Weissberg Daphine Deutscher, RN, BSN;Other   Cyndia Diver, BSN   Virtual Visit No    Medication changes reported     No    Fall or balance concerns reported    Yes    Comments She has fallen several times over the past year and she uses a walker.    Warm-up and Cool-down Performed on first and last piece of equipment    Resistance Training Performed Yes    VAD Patient? No      Pain Assessment   Currently in Pain? No/denies             Capillary Blood Glucose: No results found for this or any previous visit (from the past 24 hour(s)).    Social History   Tobacco Use  Smoking Status Former   Current packs/day: 0.00   Types: Cigarettes   Quit date: 2007   Years since quitting: 17.6   Passive exposure: Past  Smokeless Tobacco Not on file    Goals Met:  Proper associated with RPD/PD & O2 Sat Independence with exercise equipment Using PLB without cueing & demonstrates good technique Exercise tolerated well Queuing for purse lip breathing No report of concerns or symptoms today Strength training completed today  Goals Unmet:  Not Applicable  Comments: Pt able to follow exercise prescription today without complaint.  Will continue to monitor for progression.    Dr. Erick Blinks is Medical Director for [facility].

## 2023-04-14 ENCOUNTER — Encounter (HOSPITAL_COMMUNITY): Payer: Medicare Other

## 2023-04-19 ENCOUNTER — Encounter (HOSPITAL_COMMUNITY): Payer: Medicare Other

## 2023-04-20 ENCOUNTER — Encounter (HOSPITAL_COMMUNITY): Payer: Self-pay | Admitting: *Deleted

## 2023-04-20 DIAGNOSIS — J449 Chronic obstructive pulmonary disease, unspecified: Secondary | ICD-10-CM

## 2023-04-20 NOTE — Progress Notes (Signed)
Pulmonary Individual Treatment Plan  Patient Details  Name: Kimberly Graves MRN: [id number] Date of Birth: 1950-11-27 Referring Provider:   Flowsheet Row PULMONARY REHAB COPD ORIENTATION from 10/19/2022 in [facility] CARDIAC REHABILITATION  Referring Provider Dr. Maia Plan       Initial Encounter Date:  Flowsheet Row PULMONARY REHAB COPD ORIENTATION from 10/19/2022 in [facility]ARDIAC RE[facility]ATION  Date 10/19/22       Visit Diagnosis: Chronic obstructive pulmonary disease, unspecified COPD type (HCC)  Patient's Home Medications on Admission:   Current Outpatient Medications:    acetaminophen (TYLENOL) 500 MG tablet, Take 1,000 mg by mouth daily., Disp: , Rfl:    albuterol (PROVENTIL) (2.5 MG/3ML) 0.083% nebulizer solution, Take 2.5 mg by nebulization every 6 (six) hours as needed for shortness of breath or wheezing., Disp: , Rfl:    albuterol (VENTOLIN HFA) 108 (90 Base) MCG/ACT inhaler, Inhale 2 puffs into the lungs every 6 (six) hours as needed for shortness of breath., Disp: , Rfl:    aspirin 81 MG chewable tablet, Chew 81 mg by mouth at bedtime., Disp: , Rfl:    atorvastatin (LIPITOR) 80 MG tablet, Take 1 tablet by mouth daily., Disp: , Rfl:    Blood Glucose Monitoring Suppl (ACCU-CHEK GUIDE ME) w/Device KIT, 1 Piece by Does not apply route as directed., Disp: 1 kit, Rfl: 0   busPIRone (BUSPAR) 10 MG tablet, Take 10 mg by mouth 2 (two) times daily., Disp: , Rfl:    carvedilol (COREG) 3.125 MG tablet, Take 3.125 mg by mouth 2 (two) times daily with a meal., Disp: , Rfl:    cetirizine (ZYRTEC) 10 MG tablet, Take 10 mg by mouth daily., Disp: , Rfl:    citalopram (CELEXA) 20 MG tablet, Take 20 mg by mouth daily., Disp: , Rfl:    Cranberry 200 MG CAPS, Take 200 mg by mouth at bedtime., Disp: , Rfl:    Dulaglutide (TRULICITY) 1.5 MG/0.5ML SOPN, Inject 1.5 mg into the skin once a week., Disp: 2 mL, Rfl: 2   Dulaglutide (TRULICITY) 4.5 MG/0.5ML SOPN, Inject 4.5 mg as directed  once a week., Disp: 2 mL, Rfl: 2   empagliflozin (JARDIANCE) 10 MG TABS tablet, Take 1 tablet (10 mg total) by mouth daily before breakfast., Disp: 90 tablet, Rfl: 1   fenofibrate (TRICOR) 145 MG tablet, Take 290 mg by mouth daily., Disp: , Rfl:    glucose blood (ACCU-CHEK GUIDE) test strip, Use as instructed, Disp: 100 each, Rfl: 2   ibuprofen (ADVIL) 200 MG tablet, Take 400 mg by mouth at bedtime., Disp: , Rfl:    Insulin Pen Needle (B-D ULTRAFINE III SHORT PEN) 31G X 8 MM MISC, 1 each by Does not apply route as directed., Disp: 50 each, Rfl: 1   levETIRAcetam (KEPPRA) 500 MG tablet, Take 500 mg by mouth 2 (two) times daily., Disp: , Rfl:    lisinopril (ZESTRIL) 5 MG tablet, Take 1 tablet (5 mg total) by mouth daily., Disp: , Rfl:    loperamide (IMODIUM A-D) 2 MG tablet, Take 2 mg by mouth 4 (four) times daily as needed for diarrhea or loose stools., Disp: , Rfl:    Multiple Vitamin (MULTIVITAMIN) capsule, Take 1 capsule by mouth daily., Disp: , Rfl:    omeprazole (PRILOSEC) 40 MG capsule, Take 40 mg by mouth daily before breakfast., Disp: , Rfl:    pregabalin (LYRICA) 75 MG capsule, Take 75 mg by mouth 2 (two) times daily., Disp: , Rfl:    TRELEGY ELLIPTA 200-62.5-25 MCG/ACT  AEPB, Inhale 1 puff into the lungs daily., Disp: , Rfl:    trolamine salicylate (ASPERCREME) 10 % cream, Apply 1 Application topically as needed for muscle pain., Disp: , Rfl:    VASCEPA 1 g capsule, TAKE ONE CAPSULE BY MOUTH TWICE DAILY WITH FOOD, Disp: 60 capsule, Rfl: 5  Past Medical History: Past Medical History:  Diagnosis Date   COPD (chronic obstructive pulmonary disease) (HCC)    Diabetes mellitus, type II (HCC)    Hypertension    Myocardial infarction (HCC) 2020   Neuromuscular disorder (HCC)    Rectal cancer (HCC)    Small cell carcinoma (HCC) 2007    Tobacco Use: Social History   Tobacco Use  Smoking Status Former   Current packs/day: 0.00   Types: Cigarettes   Quit date: 2007   Years since  quitting: 17.7   Passive exposure: Past  Smokeless Tobacco Not on file    Labs: Review Flowsheet  More data exists      Latest Ref Rng & Units 10/24/2021 03/31/2022 04/27/2022 08/12/2022 01/11/2023  Labs for ITP Cardiac and Pulmonary Rehab  Cholestrol 100 - 199 mg/dL - - 623     - 762   LDL (calc) 0 - 99 mg/dL - - - - 90   HDL-C >83 mg/dL - - 31     - 28   Trlycerides 0 - 149 mg/dL - - 151     - 761   Hemoglobin A1c 0.0 - 7.0 % 6.7  8.1  - 7.7  -    Details       This result is from an external source.         Capillary Blood Glucose: Lab Results  Component Value Date   GLUCAP 173 (H) 12/16/2022   GLUCAP 236 (H) 12/02/2022   GLUCAP 230 (H) 11/25/2022   GLUCAP 247 (H) 11/23/2022   GLUCAP 168 (H) 10/25/2021     Pulmonary Assessment Scores:  UCSD: Self-administered rating of dyspnea associated with activities of daily living (ADLs) 6-point scale (0 = "not at all" to 5 = "maximal or unable to do because of breathlessness")  Scoring Scores range from 0 to 120.  Minimally important difference is 5 units  CAT: CAT can identify the health impairment of COPD patients and is better correlated with disease progression.  CAT has a scoring range of zero to 40. The CAT score is classified into four groups of low (less than 10), medium (10 - 20), high (21-30) and very high (31-40) based on the impact level of disease on health status. A CAT score over 10 suggests significant symptoms.  A worsening CAT score could be explained by an exacerbation, poor medication adherence, poor inhaler technique, or progression of COPD or comorbid conditions.  CAT MCID is 2 points  mMRC: mMRC (Modified Medical Research Council) Dyspnea Scale is used to assess the degree of baseline functional disability in patients of respiratory disease due to dyspnea. No minimal important difference is established. A decrease in score of 1 point or greater is considered a positive change.   Pulmonary Function  Assessment:   Exercise Target Goals: Exercise Program Goal: Individual exercise prescription set using results from initial 6 min walk test and THRR while considering  patient's activity barriers and safety.   Exercise Prescription Goal: Initial exercise prescription builds to 30-45 minutes a day of aerobic activity, 2-3 days per week.  Home exercise guidelines will be given to patient during program as part of exercise prescription that  the participant will acknowledge.  Activity Barriers & Risk Stratification:   6 Minute Walk:   Oxygen Initial Assessment:   Oxygen Re-Evaluation:  Oxygen Re-Evaluation     Row Name 11/03/22 0730 12/01/22 1454 12/29/22 1445 01/26/23 1040 03/15/23 1530     Program Oxygen Prescription   Program Oxygen Prescription None None None None None     Home Oxygen   Home Oxygen Device Home Concentrator Home Concentrator Home Concentrator Home Concentrator Home Concentrator   Sleep Oxygen Prescription Continuous Continuous Continuous Continuous Continuous   Liters per minute 3 3 3 3 3    Home Exercise Oxygen Prescription None None None None None   Home Resting Oxygen Prescription None None None None None   Compliance with Home Oxygen Use No No No No No  only uses it on occassion     Goals/Expected Outcomes   Short Term Goals To learn and understand importance of monitoring SPO2 with pulse oximeter and demonstrate accurate use of the pulse oximeter.;To learn and understand importance of maintaining oxygen saturations>88%;To learn and demonstrate proper pursed lip breathing techniques or other breathing techniques.  To learn and understand importance of monitoring SPO2 with pulse oximeter and demonstrate accurate use of the pulse oximeter.;To learn and demonstrate proper pursed lip breathing techniques or other breathing techniques. ;To learn and understand importance of maintaining oxygen saturations>88% To learn and understand importance of monitoring SPO2 with  pulse oximeter and demonstrate accurate use of the pulse oximeter.;To learn and demonstrate proper pursed lip breathing techniques or other breathing techniques. ;To learn and understand importance of maintaining oxygen saturations>88% To learn and understand importance of monitoring SPO2 with pulse oximeter and demonstrate accurate use of the pulse oximeter.;To learn and demonstrate proper pursed lip breathing techniques or other breathing techniques. ;To learn and understand importance of maintaining oxygen saturations>88% To learn and understand importance of monitoring SPO2 with pulse oximeter and demonstrate accurate use of the pulse oximeter.;To learn and demonstrate proper pursed lip breathing techniques or other breathing techniques. ;To learn and understand importance of maintaining oxygen saturations>88%;To learn and exhibit compliance with exercise, home and travel O2 prescription   Long  Term Goals Verbalizes importance of monitoring SPO2 with pulse oximeter and return demonstration;Maintenance of O2 saturations>88%;Exhibits proper breathing techniques, such as pursed lip breathing or other method taught during program session;Compliance with respiratory medication Verbalizes importance of monitoring SPO2 with pulse oximeter and return demonstration;Maintenance of O2 saturations>88%;Exhibits proper breathing techniques, such as pursed lip breathing or other method taught during program session;Compliance with respiratory medication Verbalizes importance of monitoring SPO2 with pulse oximeter and return demonstration;Maintenance of O2 saturations>88%;Exhibits proper breathing techniques, such as pursed lip breathing or other method taught during program session;Compliance with respiratory medication Verbalizes importance of monitoring SPO2 with pulse oximeter and return demonstration;Maintenance of O2 saturations>88%;Exhibits proper breathing techniques, such as pursed lip breathing or other method  taught during program session;Compliance with respiratory medication Verbalizes importance of monitoring SPO2 with pulse oximeter and return demonstration;Maintenance of O2 saturations>88%;Exhibits proper breathing techniques, such as pursed lip breathing or other method taught during program session;Compliance with respiratory medication;Exhibits compliance with exercise, home  and travel O2 prescription   Comments -- -- -- -- Harriett Sine returned to rehab today. She does not always use her oxygen at night, but will most nights.  She has been watching her saturations more.  She is dealing with a cough.  She uses her nebulizer twice a day and finds it very helpful.  She continues to work on her  PLB.   Goals/Expected Outcomes compliance compliance compliance compliance Short: Continue to work on improving breathing Long: continue to manage pulmonary disease.    Row Name 04/12/23 1525             Program Oxygen Prescription   Program Oxygen Prescription None         Home Oxygen   Home Oxygen Device Home Concentrator       Sleep Oxygen Prescription Continuous       Liters per minute 3       Home Exercise Oxygen Prescription None       Home Resting Oxygen Prescription None       Compliance with Home Oxygen Use No         Goals/Expected Outcomes   Short Term Goals To learn and understand importance of monitoring SPO2 with pulse oximeter and demonstrate accurate use of the pulse oximeter.;To learn and demonstrate proper pursed lip breathing techniques or other breathing techniques. ;To learn and understand importance of maintaining oxygen saturations>88%;To learn and exhibit compliance with exercise, home and travel O2 prescription       Long  Term Goals Verbalizes importance of monitoring SPO2 with pulse oximeter and return demonstration;Maintenance of O2 saturations>88%;Exhibits proper breathing techniques, such as pursed lip breathing or other method taught during program session;Compliance with  respiratory medication;Exhibits compliance with exercise, home  and travel O2 prescription       Comments Gilbert stateed that she has started to use her oxygen most night. She continue to watch her saturations. She is still dealing with her cough.       Goals/Expected Outcomes Short: Continue to work on improving breathing Long: continue to manage pulmonary disease.                Oxygen Discharge (Final Oxygen Re-Evaluation):  Oxygen Re-Evaluation - 04/12/23 1525       Program Oxygen Prescription   Program Oxygen Prescription None      Home Oxygen   Home Oxygen Device Home Concentrator    Sleep Oxygen Prescription Continuous    Liters per minute 3    Home Exercise Oxygen Prescription None    Home Resting Oxygen Prescription None    Compliance with Home Oxygen Use No      Goals/Expected Outcomes   Short Term Goals To learn and understand importance of monitoring SPO2 with pulse oximeter and demonstrate accurate use of the pulse oximeter.;To learn and demonstrate proper pursed lip breathing techniques or other breathing techniques. ;To learn and understand importance of maintaining oxygen saturations>88%;To learn and exhibit compliance with exercise, home and travel O2 prescription    Long  Term Goals Verbalizes importance of monitoring SPO2 with pulse oximeter and return demonstration;Maintenance of O2 saturations>88%;Exhibits proper breathing techniques, such as pursed lip breathing or other method taught during program session;Compliance with respiratory medication;Exhibits compliance with exercise, home  and travel O2 prescription    Comments Wardine stateed that she has started to use her oxygen most night. She continue to watch her saturations. She is still dealing with her cough.    Goals/Expected Outcomes Short: Continue to work on improving breathing Long: continue to manage pulmonary disease.             Initial Exercise Prescription:   Perform Capillary Blood Glucose  checks as needed.  Exercise Prescription Changes:   Exercise Prescription Changes     Row Name 11/25/22 1500 12/02/22 1534 12/28/22 1500 01/11/23 1600 01/25/23 1500     Response to  Exercise   Blood Pressure (Admit) 104/60 120/70 114/64 112/60 120/68   Blood Pressure (Exercise) 100/62 112/64 110/78 120/70 124/74   Blood Pressure (Exit) 106/60 126/70 124/76 130/62 124/64   Heart Rate (Admit) 86 bpm 92 bpm 86 bpm 92 bpm 76 bpm   Heart Rate (Exercise) 83 bpm 88 bpm 88 bpm 87 bpm 82 bpm   Heart Rate (Exit) 81 bpm 84 bpm 81 bpm 82 bpm 76 bpm   Oxygen Saturation (Admit) 92 % 92 % 94 % 92 % 95 %   Oxygen Saturation (Exercise) 91 % 90 % 91 % 92 % 93 %   Oxygen Saturation (Exit) 90 % 93 % 95 % 95 % 94 %   Rating of Perceived Exertion (Exercise) 11 11 13 12 12    Perceived Dyspnea (Exercise) 11 11 13 13 12    Duration Continue with 30 min of aerobic exercise without signs/symptoms of physical distress. Continue with 30 min of aerobic exercise without signs/symptoms of physical distress. Continue with 30 min of aerobic exercise without signs/symptoms of physical distress. Continue with 30 min of aerobic exercise without signs/symptoms of physical distress. Continue with 30 min of aerobic exercise without signs/symptoms of physical distress.   Intensity THRR unchanged THRR unchanged THRR unchanged THRR unchanged THRR unchanged     Progression   Progression Continue to progress workloads to maintain intensity without signs/symptoms of physical distress. Continue to progress workloads to maintain intensity without signs/symptoms of physical distress. Continue to progress workloads to maintain intensity without signs/symptoms of physical distress. Continue to progress workloads to maintain intensity without signs/symptoms of physical distress. Continue to progress workloads to maintain intensity without signs/symptoms of physical distress.     Resistance Training   Training Prescription Yes Yes Yes Yes Yes    Weight 2 2 2 2 2    Reps 10-15 10-15 10-15 10-15 10-15   Time 10 Minutes 10 Minutes 10 Minutes 10 Minutes 10 Minutes     NuStep   Level 1 1 1 1 2    SPM 47 46 50 51 48   Minutes 39 39 39 39 39   METs 1.8 1.7 1.8 1.7 1.7    Row Name 02/08/23 1500 03/15/23 1500           Response to Exercise   Blood Pressure (Admit) 102/58 112/58      Blood Pressure (Exercise) 102/60 110/62      Blood Pressure (Exit) 100/60 110/70      Heart Rate (Admit) 94 bpm 86 bpm      Heart Rate (Exercise) 82 bpm 90 bpm      Heart Rate (Exit) 86 bpm 93 bpm      Oxygen Saturation (Admit) 93 % 93 %      Oxygen Saturation (Exercise) 92 % 90 %      Oxygen Saturation (Exit) 92 % 93 %      Rating of Perceived Exertion (Exercise) 12 13      Perceived Dyspnea (Exercise) 12 3      Duration Continue with 30 min of aerobic exercise without signs/symptoms of physical distress. Continue with 30 min of aerobic exercise without signs/symptoms of physical distress.      Intensity THRR unchanged THRR unchanged        Progression   Progression Continue to progress workloads to maintain intensity without signs/symptoms of physical distress. Continue to progress workloads to maintain intensity without signs/symptoms of physical distress.        Paramedic Prescription  Yes Yes      Weight 2 3      Reps 10-15 10-15      Time 10 Minutes --        NuStep   Level 2 1      SPM 54 62      Minutes 39 30      METs 1.7 1.7               Exercise Comments:   Exercise Goals and Review:   Exercise Goals     Row Name 11/03/22 0730 12/01/22 1453 12/29/22 1443 01/26/23 1038       Exercise Goals   Increase Physical Activity Yes Yes Yes Yes    Intervention Provide advice, education, support and counseling about physical activity/exercise needs.;Develop an individualized exercise prescription for aerobic and resistive training based on initial evaluation findings, risk stratification, comorbidities and  participant's personal goals. Provide advice, education, support and counseling about physical activity/exercise needs.;Develop an individualized exercise prescription for aerobic and resistive training based on initial evaluation findings, risk stratification, comorbidities and participant's personal goals. Provide advice, education, support and counseling about physical activity/exercise needs.;Develop an individualized exercise prescription for aerobic and resistive training based on initial evaluation findings, risk stratification, comorbidities and participant's personal goals. Provide advice, education, support and counseling about physical activity/exercise needs.;Develop an individualized exercise prescription for aerobic and resistive training based on initial evaluation findings, risk stratification, comorbidities and participant's personal goals.    Expected Outcomes Short Term: Attend rehab on a regular basis to increase amount of physical activity.;Long Term: Add in home exercise to make exercise part of routine and to increase amount of physical activity.;Long Term: Exercising regularly at least 3-5 days a week. Short Term: Attend rehab on a regular basis to increase amount of physical activity.;Long Term: Add in home exercise to make exercise part of routine and to increase amount of physical activity.;Long Term: Exercising regularly at least 3-5 days a week. Short Term: Attend rehab on a regular basis to increase amount of physical activity.;Long Term: Add in home exercise to make exercise part of routine and to increase amount of physical activity.;Long Term: Exercising regularly at least 3-5 days a week. Short Term: Attend rehab on a regular basis to increase amount of physical activity.;Long Term: Add in home exercise to make exercise part of routine and to increase amount of physical activity.;Long Term: Exercising regularly at least 3-5 days a week.    Increase Strength and Stamina Yes Yes Yes  Yes    Intervention Provide advice, education, support and counseling about physical activity/exercise needs.;Develop an individualized exercise prescription for aerobic and resistive training based on initial evaluation findings, risk stratification, comorbidities and participant's personal goals. Provide advice, education, support and counseling about physical activity/exercise needs.;Develop an individualized exercise prescription for aerobic and resistive training based on initial evaluation findings, risk stratification, comorbidities and participant's personal goals. Provide advice, education, support and counseling about physical activity/exercise needs.;Develop an individualized exercise prescription for aerobic and resistive training based on initial evaluation findings, risk stratification, comorbidities and participant's personal goals. Provide advice, education, support and counseling about physical activity/exercise needs.;Develop an individualized exercise prescription for aerobic and resistive training based on initial evaluation findings, risk stratification, comorbidities and participant's personal goals.    Expected Outcomes Short Term: Increase workloads from initial exercise prescription for resistance, speed, and METs.;Short Term: Perform resistance training exercises routinely during rehab and add in resistance training at home;Long Term: Improve cardiorespiratory fitness, muscular endurance and strength  as measured by increased METs and functional capacity ( ) Short Term: Increase workloads from initial exercise prescription for resistance, speed, and METs.;Short Term: Perform resistance training exercises routinely during rehab and add in resistance training at home;Long Term: Improve cardiorespiratory fitness, muscular endurance and strength as measured by increased METs and functional capacity ( ) Short Term: Increase workloads from initial exercise prescription for resistance,  speed, and METs.;Short Term: Perform resistance training exercises routinely during rehab and add in resistance training at home;Long Term: Improve cardiorespiratory fitness, muscular endurance and strength as measured by increased METs and functional capacity ( ) Short Term: Increase workloads from initial exercise prescription for resistance, speed, and METs.;Short Term: Perform resistance training exercises routinely during rehab and add in resistance training at home;Long Term: Improve cardiorespiratory fitness, muscular endurance and strength as measured by increased METs and functional capacity ( )    Able to understand and use rate of perceived exertion (RPE) scale Yes Yes Yes Yes    Intervention Provide education and explanation on how to use RPE scale Provide education and explanation on how to use RPE scale Provide education and explanation on how to use RPE scale Provide education and explanation on how to use RPE scale    Expected Outcomes Short Term: Able to use RPE daily in rehab to express subjective intensity level;Long Term:  Able to use RPE to guide intensity level when exercising independently Short Term: Able to use RPE daily in rehab to express subjective intensity level;Long Term:  Able to use RPE to guide intensity level when exercising independently Short Term: Able to use RPE daily in rehab to express subjective intensity level;Long Term:  Able to use RPE to guide intensity level when exercising independently Short Term: Able to use RPE daily in rehab to express subjective intensity level;Long Term:  Able to use RPE to guide intensity level when exercising independently    Able to understand and use Dyspnea scale Yes Yes Yes Yes    Intervention Provide education and explanation on how to use Dyspnea scale Provide education and explanation on how to use Dyspnea scale Provide education and explanation on how to use Dyspnea scale Provide education and explanation on how to use  Dyspnea scale    Expected Outcomes Short Term: Able to use Dyspnea scale daily in rehab to express subjective sense of shortness of breath during exertion;Long Term: Able to use Dyspnea scale to guide intensity level when exercising independently Short Term: Able to use Dyspnea scale daily in rehab to express subjective sense of shortness of breath during exertion;Long Term: Able to use Dyspnea scale to guide intensity level when exercising independently Short Term: Able to use Dyspnea scale daily in rehab to express subjective sense of shortness of breath during exertion;Long Term: Able to use Dyspnea scale to guide intensity level when exercising independently Short Term: Able to use Dyspnea scale daily in rehab to express subjective sense of shortness of breath during exertion;Long Term: Able to use Dyspnea scale to guide intensity level when exercising independently    Knowledge and understanding of Target Heart Rate Range (THRR) Yes Yes Yes Yes    Intervention Provide education and explanation of THRR including how the numbers were predicted and where they are located for reference Provide education and explanation of THRR including how the numbers were predicted and where they are located for reference Provide education and explanation of THRR including how the numbers were predicted and where they are located for reference Provide education and explanation of THRR including  how the numbers were predicted and where they are located for reference    Expected Outcomes Short Term: Able to state/look up THRR;Short Term: Able to use daily as guideline for intensity in rehab;Long Term: Able to use THRR to govern intensity when exercising independently Short Term: Able to state/look up THRR;Short Term: Able to use daily as guideline for intensity in rehab;Long Term: Able to use THRR to govern intensity when exercising independently Short Term: Able to state/look up THRR;Short Term: Able to use daily as guideline  for intensity in rehab;Long Term: Able to use THRR to govern intensity when exercising independently Short Term: Able to state/look up THRR;Short Term: Able to use daily as guideline for intensity in rehab;Long Term: Able to use THRR to govern intensity when exercising independently    Understanding of Exercise Prescription Yes Yes Yes Yes    Intervention Provide education, explanation, and written materials on patient's individual exercise prescription Provide education, explanation, and written materials on patient's individual exercise prescription Provide education, explanation, and written materials on patient's individual exercise prescription Provide education, explanation, and written materials on patient's individual exercise prescription    Expected Outcomes Long Term: Able to explain home exercise prescription to exercise independently;Short Term: Able to explain program exercise prescription Long Term: Able to explain home exercise prescription to exercise independently;Short Term: Able to explain program exercise prescription Long Term: Able to explain home exercise prescription to exercise independently;Short Term: Able to explain program exercise prescription Long Term: Able to explain home exercise prescription to exercise independently;Short Term: Able to explain program exercise prescription             Exercise Goals Re-Evaluation :  Exercise Goals Re-Evaluation     Row Name 11/03/22 0731 12/01/22 1453 12/29/22 1443 01/26/23 1038 03/15/23 1517     Exercise Goal Re-Evaluation   Exercise Goals Review Increase Physical Activity;Able to understand and use rate of perceived exertion (RPE) scale;Increase Strength and Stamina;Able to understand and use Dyspnea scale;Knowledge and understanding of Target Heart Rate Range (THRR);Understanding of Exercise Prescription Increase Physical Activity;Increase Strength and Stamina;Able to understand and use rate of perceived exertion (RPE)  scale;Able to understand and use Dyspnea scale;Knowledge and understanding of Target Heart Rate Range (THRR);Understanding of Exercise Prescription Increase Physical Activity;Increase Strength and Stamina;Able to understand and use rate of perceived exertion (RPE) scale;Able to understand and use Dyspnea scale;Knowledge and understanding of Target Heart Rate Range (THRR);Understanding of Exercise Prescription Increase Physical Activity;Increase Strength and Stamina;Able to understand and use rate of perceived exertion (RPE) scale;Knowledge and understanding of Target Heart Rate Range (THRR);Able to check pulse independently;Understanding of Exercise Prescription Increase Physical Activity;Increase Strength and Stamina;Understanding of Exercise Prescription   Comments Pt has not attended a PR session yet. Her daughter reports that she has pneumonia, so she has missed her first two weeks. She is hopeful to begin 4/2. Unable to assess at this time due to lack of attendance. Pt has completed 2 sessions of PR. She is deconditioned and will progress slowly in  the program. She is currently exercising at 1.8 METs on  the stepper. Will continue to monitor and progress as able., Pt has completed 5 sessions of PR. She is deconditioned and will progress slowly. She has started to increase her SPM on the stepper which is a small goal. She is currently exercising at 1.8 METs on the stepper. Will continue to monitor and progress as able . Pt has completed 9 sessions of PR. SHe continues to be deconditioned but has  started to progress by increasing her level on the stepper. She is still has low SPM. She sometimes misses classes due to transpertation. She is currenttly exercising at 1.7 METs on the stepper. Will continue to monitor and progress as able. Neziah returned to rehab today.  She has been out since 02/17/23 dealing with low blood pressures.  She has not been able to exercise as much due to not feeling as well with low  pressures.   Expected Outcomes Through exercise at rehab and at home, the patient will meet their stated goals. Through exercise at rehab and at home, the patient will meet their stated goals. Through exercise at rehab and at home, the patient will meet their stated goals. Through exercise at rehab and at home, the patient will meet their stated goals. Short: Get back to routine exercise Long: Continue to improve stamina    Row Name 03/29/23 1884 04/12/23 1503           Exercise Goal Re-Evaluation   Exercise Goals Review Increase Physical Activity;Understanding of Exercise Prescription;Increase Strength and Stamina Increase Physical Activity;Increase Strength and Stamina;Understanding of Exercise Prescription      Comments Leo has not been back to rehab since 03/15/23. Her levels have been the same (level 1) due to being deconditioned. Will continue to montior and progress as able . Khyli has been coming to rehab on and off. She was not feeling well this past week and missed rehab. I increased her level to level 2 and she stated that it feels good. She use to have in home physical therapy and she still uses the home exercise that he gave her to do exercising at home. She is decondtioned and will improve slowly.      Expected Outcomes Short: come to rehab regularly   long: continue to attend Pulm rehab Short: come to rehab regularly   long: continue to attend Pulm rehab               Discharge Exercise Prescription (Final Exercise Prescription Changes):  Exercise Prescription Changes - 03/15/23 1500       Response to Exercise   Blood Pressure (Admit) 112/58    Blood Pressure (Exercise) 110/62    Blood Pressure (Exit) 110/70    Heart Rate (Admit) 86 bpm    Heart Rate (Exercise) 90 bpm    Heart Rate (Exit) 93 bpm    Oxygen Saturation (Admit) 93 %    Oxygen Saturation (Exercise) 90 %    Oxygen Saturation (Exit) 93 %    Rating of Perceived Exertion (Exercise) 13    Perceived Dyspnea  (Exercise) 3    Duration Continue with 30 min of aerobic exercise without signs/symptoms of physical distress.    Intensity THRR unchanged      Progression   Progression Continue to progress workloads to maintain intensity without signs/symptoms of physical distress.      Resistance Training   Training Prescription Yes    Weight 3    Reps 10-15      NuStep   Level 1    SPM 62    Minutes 30    METs 1.7             Nutrition:  Target Goals: Understanding of nutrition guidelines, daily intake of sodium 1500mg , cholesterol 200mg , calories 30% from fat and 7% or less from saturated fats, daily to have 5 or more servings of fruits and vegetables.  Biometrics:    Nutrition Therapy Plan and Nutrition Goals:  Nutrition Therapy & Goals - 02/14/23 1408       Nutrition Therapy   RD appointment deferred Yes      Personal Nutrition Goals   Comments We provide educational material on heart healthy nutrition with handouts.      Intervention Plan   Intervention Nutrition handout(s) given to patient.    Expected Outcomes Short Term Goal: Understand basic principles of dietary content, such as calories, fat, sodium, cholesterol and nutrients.             Nutrition Assessments:  MEDIFICTS Score Key: >=70 Need to make dietary changes  40-70 Heart Healthy Diet <= 40 Therapeutic Level Cholesterol Diet   Picture Your Plate Scores: <16 Unhealthy dietary pattern with much room for improvement. 41-50 Dietary pattern unlikely to meet recommendations for good health and room for improvement. 51-60 More healthful dietary pattern, with some room for improvement.  >60 Healthy dietary pattern, although there may be some specific behaviors that could be improved.    Nutrition Goals Re-Evaluation:  Nutrition Goals Re-Evaluation     Row Name 03/15/23 1521 04/12/23 1515           Goals   Nutrition Goal Mechanical Eating Healthy eating      Comment Briana is working on her  diet.  She has lost her appetitie and not hungry.  We talked about mechanical eating even when not hungry or using meal replacement shakes.  We talked about not wanting to go into c[place]eia and she needs the protein to not continue to get weaker.  She loves ice cream and was encouraged to get more of it. Riayn stated that she still does not eat a whole lot. She has one ensure a day normally near breakfest time. She said that she gets up late so her breakfest is normally around lunch time.      Expected Outcome Short: Try protein shakes Long: Continue to use mechanical eating to eat more Short: continue with protein shakes  Long: continue with machanical eating to eat more               Nutrition Goals Discharge (Final Nutrition Goals Re-Evaluation):  Nutrition Goals Re-Evaluation - 04/12/23 1515       Goals   Nutrition Goal Healthy eating    Comment Darius stated that she still does not eat a whole lot. She has one ensure a day normally near breakfest time. She said that she gets up late so her breakfest is normally around lunch time.    Expected Outcome Short: continue with protein shakes  Long: continue with machanical eating to eat more             Psychosocial: Target Goals: Acknowledge presence or absence of significant depression and/or stress, maximize coping skills, provide positive support system. Participant is able to verbalize types and ability to use techniques and skills needed for reducing stress and depression.  Initial Review & Psychosocial Screening:   Quality of Life Scores:  Scores of 19 and below usually indicate a poorer quality of life in these areas.  A difference of  2-3 points is a clinically meaningful difference.  A difference of 2-3 points in the total score of the Quality of Life Index has been associated with significant improvement in overall quality of life, self-image, physical symptoms, and general health in studies assessing change in quality of  life.   PHQ-9: Review Flowsheet       03/15/2023 10/19/2022  Depression screen PHQ 2/9  Decreased Interest 3 1  Down, Depressed, Hopeless 1 1  PHQ - 2 Score 4 2  Altered sleeping 3 3  Tired, decreased energy 3 3  Change in appetite 3 1  Feeling bad or failure about yourself  1 1  Trouble concentrating 1 1  Moving slowly or fidgety/restless 1 0  Suicidal thoughts 1 0  PHQ-9 Score 17 11  Difficult doing work/chores Somewhat difficult Somewhat difficult    Details           Interpretation of Total Score  Total Score Depression Severity:  1-4 = Minimal depression, 5-9 = Mild depression, 10-14 = Moderate depression, 15-19 = Moderately severe depression, 20-27 = Severe depression   Psychosocial Evaluation and Intervention:   Psychosocial Re-Evaluation:  Psychosocial Re-Evaluation     Row Name 10/25/22 4098 11/19/22 1339 12/20/22 1431 01/17/23 1301 02/14/23 1409     Psychosocial Re-Evaluation   Current issues with History of Depression;None Identified;Current Psychotropic Meds History of Depression;None Identified;Current Psychotropic Meds History of Depression;Current Psychotropic Meds History of Depression;Current Psychotropic Meds History of Depression;Current Psychotropic Meds   Comments Patient has not started class yet, plans to this week.  Her initial PHQ-7 score is 14 and QOL score is 20.77%.  Patient was referred to PR with COPD via Dr. Renaldo Reel.  Her personal goals are to decrease her SOB with exertion, go up stairs without SOB and gain confidence to go back shopping. Patient plans to start soon.  Her initial PHQ-7 score is 14 and QOL score is 20.77%.  Patient was referred to PR with COPD via Dr. Renaldo Reel.  Her personal goals are to decrease her SOB with exertion, go up stairs without SOB and gain confidence to go back shopping. Patient has started with attending only 3 sessions.  Her attendance is limited due to transportation problems.  Patient was referred to PR with COPD via  Dr. Renaldo Reel.  She reports having a history of depression that is managed with Buspar and citalopram.  She seems to enjoy coming to class and interactive with others in class. We will contiue to monitor her as she progresses in the program. Patient has completed 7th sessions.  Her attendance is limited due to transportation problems.  Patient was referred to PR with COPD via Dr. Renaldo Reel.  She reports having a history of depression that is managed with Buspar and citalopram.  She seems to enjoy coming to class.  She continues to demonstrate a positive outlook and interactive with class and staff. We will contiue to monitor her as she progresses in the program. Patient has completed 12 sessions. Transportation is a barrier to her being able to attend. There are no other physhosocial barriers identified. Patient's depression continues to be managed with Buspar and citalopram.  She continues to  enjoy coming the sessions.  She continues to demonstrate a positive outlook and interactive with class and staff. We will contiue to monitor her as she progresses in the program.   Expected Outcomes Patient will have no psychosocial issuses identified at discharge. Patient will have no psychosocial issuses identified at discharge. Patient will have no psychosocial issuses identified at discharge. Patient will have no psychosocial issuses identified at discharge. Patient will have no psychosocial issuses identified at discharge.   Interventions Stress management education;Relaxation education;Encouraged to attend Pulmonary Rehabilitation for the exercise Stress management education;Relaxation education;Encouraged to attend Pulmonary Rehabilitation for the exercise Stress management education;Relaxation education;Encouraged to attend Pulmonary Rehabilitation for the exercise Stress management education;Relaxation education;Encouraged to attend Pulmonary Rehabilitation  for the exercise Stress management education;Relaxation  education;Encouraged to attend Pulmonary Rehabilitation for the exercise   Continue Psychosocial Services  No Follow up required No Follow up required No Follow up required No Follow up required No Follow up required    Row Name 03/15/23 1524 04/12/23 1509           Psychosocial Re-Evaluation   Current issues with History of Depression;Current Psychotropic Meds --      Comments Clairice has been out dealing with low blood pressure issues. She had enjoyed her vacation, but came back to her pressures running too low.  She fatigues quickly and easily frustrated by it.  She lost her husband recently and she is still adjusting to that and has days that are harder than others.  Her PHQ was 17. She is on celexa to help. She continues to grieve her husband. Willeen stated that her BP has not been as low as it was the past week. She continues to fatigue quicky and stated that she goes home after rehab and takes a nap. She loves to read throughout the day and it helps her pass her time.      Expected Outcomes Short: Get back to routine exercise for mental boost Long: Continue to grieve for husband. Short: Get back to routine exercise for mental boost Long: continue to find things, like reading, that she enjoys      Interventions Stress management education;Encouraged to attend Pulmonary Rehabilitation for the exercise Stress management education;Relaxation education;Encouraged to attend Pulmonary Rehabilitation for the exercise      Continue Psychosocial Services  Follow up required by staff Follow up required by staff               Psychosocial Discharge (Final Psychosocial Re-Evaluation):  Psychosocial Re-Evaluation - 04/12/23 1509       Psychosocial Re-Evaluation   Comments Sayda stated that her BP has not been as low as it was the past week. She continues to fatigue quicky and stated that she goes home after rehab and takes a nap. She loves to read throughout the day and it helps her pass her time.     Expected Outcomes Short: Get back to routine exercise for mental boost Long: continue to find things, like reading, that she enjoys    Interventions Stress management education;Relaxation education;Encouraged to attend Pulmonary Rehabilitation for the exercise    Continue Psychosocial Services  Follow up required by staff              Education: Education Goals: Education classes will be provided on a weekly basis, covering required topics. Participant will state understanding/return demonstration of topics presented.  Learning Barriers/Preferences:   Education Topics: How Lungs Work and Diseases: - Discuss the anatomy of the lungs and diseases that can affect the lungs, such as COPD.   Exercise: -Discuss the importance of exercise, FITT principles of exercise, normal and abnormal responses to exercise, and how to exercise safely.   Environmental Irritants: -Discuss types of environmental irritants and how to limit exposure to environmental irritants. Flowsheet Row PULMONARY REHAB OTHER RESPIRATORY from 03/31/2023 in [facility]  Date 02/17/23  Educator handout       Meds/Inhalers and oxygen: - Discuss respiratory medications, definition of an inhaler and oxygen, and the proper way to use an inhaler and oxygen. Flowsheet Row PULMONARY REHAB OTHER RESPIRATORY from 03/31/2023 in [facility]  Date 11/25/22  Educator handout       Energy Saving Techniques: -  Discuss methods to conserve energy and decrease shortness of breath when performing activities of daily living.  Flowsheet Row PULMONARY REHAB OTHER RESPIRATORY from 03/31/2023 in [facility]  Date 12/02/22  Educator handout       Bronchial Hygiene / Breathing Techniques: - Discuss breathing mechanics, pursed-lip breathing technique,  proper posture, effective ways to clear airways, and other functional breathing techniques   Cleaning  Equipment: - Provides group verbal and written instruction about the health risks of elevated stress, cause of high stress, and healthy ways to reduce stress.   Nutrition I: Fats: - Discuss the types of cholesterol, what cholesterol does to the body, and how cholesterol levels can be controlled. Flowsheet Row PULMONARY REHAB OTHER RESPIRATORY from 03/31/2023 in [facility]  Date 12/23/22  Educator handout       Nutrition II: Labels: -Discuss the different components of food labels and how to read food labels.   Respiratory Infections: - Discuss the signs and symptoms of respiratory infections, ways to prevent respiratory infections, and the importance of seeking medical treatment when having a respiratory infection.   Stress I: Signs and Symptoms: - Discuss the causes of stress, how stress may lead to anxiety and depression, and ways to limit stress. Flowsheet Row PULMONARY REHAB OTHER RESPIRATORY from 03/31/2023 in [facility]  Date 03/31/23  Educator HB  Instruction Review Code 1- Verbalizes Understanding       Stress II: Relaxation: -Discuss relaxation techniques to limit stress. Flowsheet Row PULMONARY REHAB OTHER RESPIRATORY from 03/31/2023 in [facility]  Date 01/27/23  Educator handout       Oxygen for Home/Travel: - Discuss how to prepare for travel when on oxygen and proper ways to transport and store oxygen to ensure safety. Flowsheet Row PULMONARY REHAB OTHER RESPIRATORY from 03/31/2023 in [facility]  Date 02/03/23  Educator handout       Knowledge Questionnaire Score:   Core Components/Risk Factors/Patient Goals at Admission:   Core Components/Risk Factors/Patient Goals Review:   Goals and Risk Factor Review     Row Name 10/25/22 0941 11/19/22 1342 12/20/22 1443 01/17/23 1307 02/14/23 1411     Core Components/Risk Factors/Patient Goals Review   Personal Goals  Review Improve shortness of breath with ADL's;Develop more efficient breathing techniques such as purse lipped breathing and diaphragmatic breathing and practicing self-pacing with activity.;Other Improve shortness of breath with ADL's;Develop more efficient breathing techniques such as purse lipped breathing and diaphragmatic breathing and practicing self-pacing with activity.;Other Improve shortness of breath with ADL's;Develop more efficient breathing techniques such as purse lipped breathing and diaphragmatic breathing and practicing self-pacing with activity.;Other Improve shortness of breath with ADL's;Develop more efficient breathing techniques such as purse lipped breathing and diaphragmatic breathing and practicing self-pacing with activity.;Other Improve shortness of breath with ADL's;Develop more efficient breathing techniques such as purse lipped breathing and diaphragmatic breathing and practicing self-pacing with activity.;Other   Review Patient plans to start program soon.  Patient referred to PR with COPD via Dr. Renaldo Reel.  Her personal goals for the program is to decrease SOB with exertion, go up stairs without SOB and gain confidence to go back shopping without SOB.  We will continue to monitor her progress as she works toward meeting these goals. Patient plans to start program soon.  Patient referred to PR with COPD via Dr. Renaldo Reel.  Her personal goals for the program is to decrease SOB with exertion, go up stairs without SOB and gain  confidence to go back shopping without SOB.  We will continue to monitor her progress as she works toward meeting these goals. Patient has started program with only attending 3 sessions. Her low attendance is due to transportation problems.  Her personal goals for the program is to decrease SOB with exertion, go up stairs without SOB and gain confidence to go back shopping without SOB.  We will help her develpe a efficient breathing technique, such as purse lipped  breathing and practice self pacing with increased activity.  We will continue to monitor her progress as she works toward meeting these goals. Patient has completed 7th sessions. Her low attendance is due to transportation problems.  Her personal goals for the program is to decrease SOB with exertion, go up stairs without SOB and gain confidence to go back shopping without SOB.  We will help her develope a efficient breathing technique, such as purse lipped breathing and practice self pacing with increased activity. Her O2 sats average between 90%-94% without  any supplemental oxygen needed at  this time.  We will continue to monitor her progress as she works toward meeting these goals. Patient has completed 12sessions. She is doing well in the program with inconsistent attendance due to transportation. Her current weight is 75.3 KG down 1 KG from last 30 day review. She exercises on RA with O2 sats averaging between 90%-92%. Her personal goals for the program is to decrease SOB with exertion, go up stairs without SOB and gain confidence to go back shopping without SOB.We will continue to monitor her progress as she works toward meeting these goals.   Expected Outcomes Patient will complete the program meeting both program and personal goals. Patient will complete the program meeting both program and personal goals. Patient will complete the program meeting both program and personal goals. Patient will complete the program meeting both program and personal goals. Patient will complete the program meeting both program and personal goals.    Row Name 03/15/23 1519 04/12/23 1519           Core Components/Risk Factors/Patient Goals Review   Personal Goals Review Weight Management/Obesity;Hypertension;Improve shortness of breath with ADL's;Increase knowledge of respiratory medications and ability to use respiratory devices properly. Weight Management/Obesity;Hypertension;Improve shortness of breath with  ADL's;Increase knowledge of respiratory medications and ability to use respiratory devices properly.      Review Elenamarie returned to rehab after being out since 02/17/23  with low blood pressures.  She has not been feeling like herself with the low pressure and the doctors have told her to just keep moving and exercising.  She is also dealing with a cough.  She is losing weight from not eating and not being hungry. She has been struggling with her breathing more and with coughing more too.  She now has cough syrup to help.  She has been using her nebulizer and her daughter sets it up for her twice a day.  She is also using her inhalers daily too. Mikael still is improving from having low blood pressure and feeling bad. She is trying to keep moving and exercising to improve mental boost. She is still having trouble with her breathing and coughing. She continues to use her nebulixer twice a day, her daughter sets it up for her. She continues to use her inhaler daily too.      Expected Outcomes Short:Continue to keep close eye on pressures Long: Get back to routine exercise Short:Continue to keep close eye on pressures Long:  Get back to routine exercise               Core Components/Risk Factors/Patient Goals at Discharge (Final Review):   Goals and Risk Factor Review - 04/12/23 1519       Core Components/Risk Factors/Patient Goals Review   Personal Goals Review Weight Management/Obesity;Hypertension;Improve shortness of breath with ADL's;Increase knowledge of respiratory medications and ability to use respiratory devices properly.    Review Krislyn still is improving from having low blood pressure and feeling bad. She is trying to keep moving and exercising to improve mental boost. She is still having trouble with her breathing and coughing. She continues to use her nebulixer twice a day, her daughter sets it up for her. She continues to use her inhaler daily too.    Expected Outcomes Short:Continue to keep  close eye on pressures Long: Get back to routine exercise             ITP Comments:  ITP Comments     Row Name 02/23/23 1615 03/23/23 1522 04/20/23 1528       ITP Comments 30 day review completed. ITP sent to Dr.Jehanzeb Memon, Medical Director of  Pulmonary Rehab. Continue with ITP unless changes are made by physician. 30 day review completed. ITP sent to Dr.Jehanzeb Memon, Medical Director of  Pulmonary Rehab. Continue with ITP unless changes are made by physician. 30 day review completed. ITP sent to Dr.Jehanzeb Memon, Medical Director of  Pulmonary Rehab. Continue with ITP unless changes are made by physician.              Comments: 30 day review

## 2023-04-21 ENCOUNTER — Encounter (HOSPITAL_COMMUNITY): Payer: Medicare Other

## 2023-04-25 ENCOUNTER — Ambulatory Visit: Payer: Medicare Other | Admitting: "Endocrinology

## 2023-04-26 ENCOUNTER — Encounter (HOSPITAL_COMMUNITY): Admission: RE | Admit: 2023-04-26 | Payer: Medicare Other | Source: Ambulatory Visit

## 2023-04-28 ENCOUNTER — Encounter (HOSPITAL_COMMUNITY): Payer: Medicare Other

## 2023-05-03 ENCOUNTER — Encounter (HOSPITAL_COMMUNITY): Payer: Medicare Other

## 2023-05-05 ENCOUNTER — Encounter (HOSPITAL_COMMUNITY): Payer: Medicare Other

## 2023-05-10 ENCOUNTER — Other Ambulatory Visit: Payer: Self-pay | Admitting: "Endocrinology

## 2023-05-10 ENCOUNTER — Encounter (HOSPITAL_COMMUNITY)
Admission: RE | Admit: 2023-05-10 | Discharge: 2023-05-10 | Disposition: A | Discharge status: Home / Self Care | Payer: Medicare Other | Source: Ambulatory Visit | Attending: Internal Medicine | Admitting: Internal Medicine

## 2023-05-10 DIAGNOSIS — E1165 Type 2 diabetes mellitus with hyperglycemia: Secondary | ICD-10-CM

## 2023-05-10 DIAGNOSIS — J449 Chronic obstructive pulmonary disease, unspecified: Secondary | ICD-10-CM | POA: Insufficient documentation

## 2023-05-12 ENCOUNTER — Encounter (HOSPITAL_COMMUNITY)
Admission: RE | Admit: 2023-05-12 | Discharge: 2023-05-12 | Disposition: A | Discharge status: Home / Self Care | Payer: Medicare Other | Source: Ambulatory Visit | Attending: Internal Medicine | Admitting: Internal Medicine

## 2023-05-12 VITALS — Ht 60.0 in | Wt 160.9 lb

## 2023-05-12 DIAGNOSIS — J449 Chronic obstructive pulmonary disease, unspecified: Secondary | ICD-10-CM | POA: Diagnosis present

## 2023-05-12 NOTE — Progress Notes (Signed)
Daily Session Note  Patient Details  Name: Kimberly Graves MRN: [id number] Date of Birth: [birth year] Referring Provider:   Flowsheet Row PULMONARY REHAB COPD ORIENTATION from 10/19/2022 in [facility] CARDIAC RE[facility]ATION  Referring Provider Dr. Maia Plan       Encounter Date: 05/12/2023  Check In:  Session Check In - 05/12/23 1430       Check-In   Supervising physician immediately available to respond to emergencies See telemetry face sheet for immediately available ER MD    Staff Present Rodena Medin, RN, Pleas Koch, RN, BSN;Heather Fredric Mare, BS, Exercise Physiologist    Virtual Visit No    Medication changes reported     No    Fall or balance concerns reported    Yes    Comments She has fallen several times over the past year and she uses a walker.    Warm-up and Cool-down Performed on first and last piece of equipment    Resistance Training Performed Yes    VAD Patient? No    PAD/SET Patient? No      Pain Assessment   Currently in Pain? No/denies    Pain Score 0-No pain    Multiple Pain Sites No             Capillary Blood Glucose: No results found for this or any previous visit (from the past 24 hour(s)).    Social History   Tobacco Use  Smoking Status Former   Current packs/day: 0.00   Types: Cigarettes   Quit date: 2007   Years since quitting: 17.7   Passive exposure: Past  Smokeless Tobacco Not on file    Goals Met:  Proper associated with RPD/PD & O2 Sat Independence with exercise equipment Using PLB without cueing & demonstrates good technique Exercise tolerated well No report of concerns or symptoms today Strength training completed today  Goals Unmet:  Not Applicable  Comments: Pt able to follow exercise prescription today without complaint.  Will continue to monitor for progression.    Dr. Erick Blinks is Medical Director for [facility] Pul[facility].

## 2023-05-13 NOTE — Progress Notes (Signed)
Kimberly Graves graduated today from  rehab with 19 sessions completed.  Details of the patient's exercise prescription and what She needs to do in order to continue the prescription and progress were discussed with patient.  Patient was given a copy of prescription and goals.  Patient verbalized understanding. Harriett Sine plans to continue to exercise by walking at home.  6 Minute Walk     Row Name 05/12/23 1558         6 Minute Walk   Phase Discharge     Distance 380 feet     Walk Time 4.15 minutes     # of Rest Breaks 2     MPH 0.71     METS 0.79     RPE 14     Perceived Dyspnea  3     Symptoms Yes (comment)     Resting HR 88 bpm     Resting BP 100/60     Resting Oxygen Saturation  93 %     Exercise Oxygen Saturation  during 6 min walk 91 %     Max Ex. HR 109 bpm     Max Ex. BP 118/60     2 Minute Post BP 110/60       Interval HR   1 Minute HR 101     2 Minute HR 99     3 Minute HR 100     4 Minute HR 98     5 Minute HR 109     2 Minute Post HR 87     Interval Heart Rate? Yes       Interval Oxygen   Interval Oxygen? Yes     Baseline Oxygen Saturation % 93 %     1 Minute Oxygen Saturation % 91 %     1 Minute Liters of Oxygen 0 L     2 Minute Oxygen Saturation % 92 %     2 Minute Liters of Oxygen 0 L     3 Minute Oxygen Saturation % 91 %     3 Minute Liters of Oxygen 0 L     4 Minute Oxygen Saturation % 93 %     4 Minute Liters of Oxygen 0 L     5 Minute Oxygen Saturation % 91 %     5 Minute Liters of Oxygen 0 L     2 Minute Post Oxygen Saturation % 94 %     2 Minute Post Liters of Oxygen 0 L

## 2023-05-13 NOTE — Progress Notes (Signed)
Pulmonary Individual Treatment Plan  Patient Details  Name: Kimberly Graves MRN: [id number] Date of Birth: June 02, 1951 Referring Provider:   Flowsheet Row PULMONARY REHAB COPD ORIENTATION from 10/19/2022 in [facility]  Referring Provider Dr. Maia Plan       Initial Encounter Date:  Flowsheet Row PULMONARY REHAB COPD ORIENTATION from 10/19/2022 in [facility]  Date 10/19/22       Visit Diagnosis: Chronic obstructive pulmonary disease, unspecified COPD type (HCC)  Patient's Home Medications on Admission:   Current Outpatient Medications:    acetaminophen (TYLENOL) 500 MG tablet, Take 1,000 mg by mouth daily., Disp: , Rfl:    albuterol (PROVENTIL) (2.5 MG/3ML) 0.083% nebulizer solution, Take 2.5 mg by nebulization every 6 (six) hours as needed for shortness of breath or wheezing., Disp: , Rfl:    albuterol (VENTOLIN HFA) 108 (90 Base) MCG/ACT inhaler, Inhale 2 puffs into the lungs every 6 (six) hours as needed for shortness of breath., Disp: , Rfl:    aspirin 81 MG chewable tablet, Chew 81 mg by mouth at bedtime., Disp: , Rfl:    atorvastatin (LIPITOR) 80 MG tablet, Take 1 tablet by mouth daily., Disp: , Rfl:    Blood Glucose Monitoring Suppl (ACCU-CHEK GUIDE ME) w/Device KIT, 1 Piece by Does not apply route as directed., Disp: 1 kit, Rfl: 0   busPIRone (BUSPAR) 10 MG tablet, Take 10 mg by mouth 2 (two) times daily., Disp: , Rfl:    carvedilol (COREG) 3.125 MG tablet, Take 3.125 mg by mouth 2 (two) times daily with a meal., Disp: , Rfl:    cetirizine (ZYRTEC) 10 MG tablet, Take 10 mg by mouth daily., Disp: , Rfl:    citalopram (CELEXA) 20 MG tablet, Take 20 mg by mouth daily., Disp: , Rfl:    Cranberry 200 MG CAPS, Take 200 mg by mouth at bedtime., Disp: , Rfl:    Dulaglutide (TRULICITY) 1.5 MG/0.5ML SOPN, Inject 1.5 mg into the skin once a week., Disp: 2 mL, Rfl: 2   empagliflozin (JARDIANCE) 10 MG TABS tablet, Take 1 tablet (10 mg total)  by mouth daily before breakfast., Disp: 90 tablet, Rfl: 1   fenofibrate (TRICOR) 145 MG tablet, Take 290 mg by mouth daily., Disp: , Rfl:    glucose blood (ACCU-CHEK GUIDE) test strip, Use as instructed, Disp: 100 each, Rfl: 2   ibuprofen (ADVIL) 200 MG tablet, Take 400 mg by mouth at bedtime., Disp: , Rfl:    Insulin Pen Needle (B-D ULTRAFINE III SHORT PEN) 31G X 8 MM MISC, 1 each by Does not apply route as directed., Disp: 50 each, Rfl: 1   levETIRAcetam (KEPPRA) 500 MG tablet, Take 500 mg by mouth 2 (two) times daily., Disp: , Rfl:    lisinopril (ZESTRIL) 5 MG tablet, Take 1 tablet (5 mg total) by mouth daily., Disp: , Rfl:    loperamide (IMODIUM A-D) 2 MG tablet, Take 2 mg by mouth 4 (four) times daily as needed for diarrhea or loose stools., Disp: , Rfl:    Multiple Vitamin (MULTIVITAMIN) capsule, Take 1 capsule by mouth daily., Disp: , Rfl:    omeprazole (PRILOSEC) 40 MG capsule, Take 40 mg by mouth daily before breakfast., Disp: , Rfl:    pregabalin (LYRICA) 75 MG capsule, Take 75 mg by mouth 2 (two) times daily., Disp: , Rfl:    TRELEGY ELLIPTA 200-62.5-25 MCG/ACT AEPB, Inhale 1 puff into the lungs daily., Disp: , Rfl:    trolamine salicylate (ASPERCREME) 10 % cream,  Apply 1 Application topically as needed for muscle pain., Disp: , Rfl:    TRULICITY 4.5 MG/0.5ML SOPN, INJECT 4.5MG  SUBCUTANEOUSLY WEEKLY, Disp: 2 mL, Rfl: 2   VASCEPA 1 g capsule, TAKE ONE CAPSULE BY MOUTH TWICE DAILY WITH FOOD, Disp: 60 capsule, Rfl: 5  Past Medical History: Past Medical History:  Diagnosis Date   COPD (chronic obstructive pulmonary disease) (HCC)    Diabetes mellitus, type II (HCC)    Hypertension    Myocardial infarction (HCC) 2020   Neuromuscular disorder (HCC)    Rectal cancer (HCC)    Small cell carcinoma (HCC) 2007    Tobacco Use: Social History   Tobacco Use  Smoking Status Former   Current packs/day: 0.00   Types: Cigarettes   Quit date: 2007   Years since quitting: 17.7   Passive  exposure: Past  Smokeless Tobacco Not on file    Labs: Review Flowsheet  More data exists      Latest Ref Rng & Units 10/24/2021 03/31/2022 04/27/2022 08/12/2022 01/11/2023  Labs for ITP Cardiac and Pulmonary Rehab  Cholestrol 100 - 199 mg/dL - - 161     - 096   LDL (calc) 0 - 99 mg/dL - - - - 90   HDL-C >04 mg/dL - - 31     - 28   Trlycerides 0 - 149 mg/dL - - 540     - 981   Hemoglobin A1c 0.0 - 7.0 % 6.7  8.1  - 7.7  -    Details       This result is from an external source.         Capillary Blood Glucose: Lab Results  Component Value Date   GLUCAP 173 (H) 12/16/2022   GLUCAP 236 (H) 12/02/2022   GLUCAP 230 (H) 11/25/2022   GLUCAP 247 (H) 11/23/2022   GLUCAP 168 (H) 10/25/2021     Pulmonary Assessment Scores:  UCSD: Self-administered rating of dyspnea associated with activities of daily living (ADLs) 6-point scale (0 = "not at all" to 5 = "maximal or unable to do because of breathlessness")  Scoring Scores range from 0 to 120.  Minimally important difference is 5 units  CAT: CAT can identify the health impairment of COPD patients and is better correlated with disease progression.  CAT has a scoring range of zero to 40. The CAT score is classified into four groups of low (less than 10), medium (10 - 20), high (21-30) and very high (31-40) based on the impact level of disease on health status. A CAT score over 10 suggests significant symptoms.  A worsening CAT score could be explained by an exacerbation, poor medication adherence, poor inhaler technique, or progression of COPD or comorbid conditions.  CAT MCID is 2 points  mMRC: mMRC (Modified Medical Research Council) Dyspnea Scale is used to assess the degree of baseline functional disability in patients of respiratory disease due to dyspnea. No minimal important difference is established. A decrease in score of 1 point or greater is considered a positive change.   Pulmonary Function Assessment:   Exercise Target  Goals: Exercise Program Goal: Individual exercise prescription set using results from initial 6 min walk test and THRR while considering  patient's activity barriers and safety.   Exercise Prescription Goal: Initial exercise prescription builds to 30-45 minutes a day of aerobic activity, 2-3 days per week.  Home exercise guidelines will be given to patient during program as part of exercise prescription that the participant will acknowledge.  Activity Barriers & Risk Stratification:   6 Minute Walk:  6 Minute Walk     Row Name 05/12/23 1558         6 Minute Walk   Phase Discharge     Distance 380 feet     Walk Time 4.15 minutes     # of Rest Breaks 2     MPH 0.71     METS 0.79     RPE 14     Perceived Dyspnea  3     Symptoms Yes (comment)     Resting HR 88 bpm     Resting BP 100/60     Resting Oxygen Saturation  93 %     Exercise Oxygen Saturation  during 6 min walk 91 %     Max Ex. HR 109 bpm     Max Ex. BP 118/60     2 Minute Post BP 110/60       Interval HR   1 Minute HR 101     2 Minute HR 99     3 Minute HR 100     4 Minute HR 98     5 Minute HR 109     2 Minute Post HR 87     Interval Heart Rate? Yes       Interval Oxygen   Interval Oxygen? Yes     Baseline Oxygen Saturation % 93 %     1 Minute Oxygen Saturation % 91 %     1 Minute Liters of Oxygen 0 L     2 Minute Oxygen Saturation % 92 %     2 Minute Liters of Oxygen 0 L     3 Minute Oxygen Saturation % 91 %     3 Minute Liters of Oxygen 0 L     4 Minute Oxygen Saturation % 93 %     4 Minute Liters of Oxygen 0 L     5 Minute Oxygen Saturation % 91 %     5 Minute Liters of Oxygen 0 L     2 Minute Post Oxygen Saturation % 94 %     2 Minute Post Liters of Oxygen 0 L              Oxygen Initial Assessment:   Oxygen Re-Evaluation:  Oxygen Re-Evaluation     Row Name 12/01/22 1454 12/29/22 1445 01/26/23 1040 03/15/23 1530 04/12/23 1525     Program Oxygen Prescription   Program Oxygen  Prescription None None None None None     Home Oxygen   Home Oxygen Device Home Concentrator Home Concentrator Home Concentrator Home Concentrator Home Concentrator   Sleep Oxygen Prescription Continuous Continuous Continuous Continuous Continuous   Liters per minute 3 3 3 3 3    Home Exercise Oxygen Prescription None None None None None   Home Resting Oxygen Prescription None None None None None   Compliance with Home Oxygen Use No No No No  only uses it on occassion No     Goals/Expected Outcomes   Short Term Goals To learn and understand importance of monitoring SPO2 with pulse oximeter and demonstrate accurate use of the pulse oximeter.;To learn and demonstrate proper pursed lip breathing techniques or other breathing techniques. ;To learn and understand importance of maintaining oxygen saturations>88% To learn and understand importance of monitoring SPO2 with pulse oximeter and demonstrate accurate use of the pulse oximeter.;To learn and demonstrate proper pursed lip breathing techniques or other breathing techniques. ;To  learn and understand importance of maintaining oxygen saturations>88% To learn and understand importance of monitoring SPO2 with pulse oximeter and demonstrate accurate use of the pulse oximeter.;To learn and demonstrate proper pursed lip breathing techniques or other breathing techniques. ;To learn and understand importance of maintaining oxygen saturations>88% To learn and understand importance of monitoring SPO2 with pulse oximeter and demonstrate accurate use of the pulse oximeter.;To learn and demonstrate proper pursed lip breathing techniques or other breathing techniques. ;To learn and understand importance of maintaining oxygen saturations>88%;To learn and exhibit compliance with exercise, home and travel O2 prescription To learn and understand importance of monitoring SPO2 with pulse oximeter and demonstrate accurate use of the pulse oximeter.;To learn and demonstrate proper  pursed lip breathing techniques or other breathing techniques. ;To learn and understand importance of maintaining oxygen saturations>88%;To learn and exhibit compliance with exercise, home and travel O2 prescription   Long  Term Goals Verbalizes importance of monitoring SPO2 with pulse oximeter and return demonstration;Maintenance of O2 saturations>88%;Exhibits proper breathing techniques, such as pursed lip breathing or other method taught during program session;Compliance with respiratory medication Verbalizes importance of monitoring SPO2 with pulse oximeter and return demonstration;Maintenance of O2 saturations>88%;Exhibits proper breathing techniques, such as pursed lip breathing or other method taught during program session;Compliance with respiratory medication Verbalizes importance of monitoring SPO2 with pulse oximeter and return demonstration;Maintenance of O2 saturations>88%;Exhibits proper breathing techniques, such as pursed lip breathing or other method taught during program session;Compliance with respiratory medication Verbalizes importance of monitoring SPO2 with pulse oximeter and return demonstration;Maintenance of O2 saturations>88%;Exhibits proper breathing techniques, such as pursed lip breathing or other method taught during program session;Compliance with respiratory medication;Exhibits compliance with exercise, home  and travel O2 prescription Verbalizes importance of monitoring SPO2 with pulse oximeter and return demonstration;Maintenance of O2 saturations>88%;Exhibits proper breathing techniques, such as pursed lip breathing or other method taught during program session;Compliance with respiratory medication;Exhibits compliance with exercise, home  and travel O2 prescription   Comments -- -- -- Kimberly Graves returned to rehab today. She does not always use her oxygen at night, but will most nights.  She has been watching her saturations more.  She is dealing with a cough.  She uses her nebulizer  twice a day and finds it very helpful.  She continues to work on her PLB. Kimberly Graves stateed that she has started to use her oxygen most night. She continue to watch her saturations. She is still dealing with her cough.   Goals/Expected Outcomes compliance compliance compliance Short: Continue to work on improving breathing Long: continue to manage pulmonary disease. Short: Continue to work on improving breathing Long: continue to manage pulmonary disease.            Oxygen Discharge (Final Oxygen Re-Evaluation):  Oxygen Re-Evaluation - 04/12/23 1525       Program Oxygen Prescription   Program Oxygen Prescription None      Home Oxygen   Home Oxygen Device Home Concentrator    Sleep Oxygen Prescription Continuous    Liters per minute 3    Home Exercise Oxygen Prescription None    Home Resting Oxygen Prescription None    Compliance with Home Oxygen Use No      Goals/Expected Outcomes   Short Term Goals To learn and understand importance of monitoring SPO2 with pulse oximeter and demonstrate accurate use of the pulse oximeter.;To learn and demonstrate proper pursed lip breathing techniques or other breathing techniques. ;To learn and understand importance of maintaining oxygen saturations>88%;To learn and exhibit compliance  with exercise, home and travel O2 prescription    Long  Term Goals Verbalizes importance of monitoring SPO2 with pulse oximeter and return demonstration;Maintenance of O2 saturations>88%;Exhibits proper breathing techniques, such as pursed lip breathing or other method taught during program session;Compliance with respiratory medication;Exhibits compliance with exercise, home  and travel O2 prescription    Comments Kimberly Graves stateed that she has started to use her oxygen most night. She continue to watch her saturations. She is still dealing with her cough.    Goals/Expected Outcomes Short: Continue to work on improving breathing Long: continue to manage pulmonary disease.              Initial Exercise Prescription:   Perform Capillary Blood Glucose checks as needed.  Exercise Prescription Changes:   Exercise Prescription Changes     Row Name 11/25/22 1500 12/02/22 1534 12/28/22 1500 01/11/23 1600 01/25/23 1500     Response to Exercise   Blood Pressure (Admit) 104/60 120/70 114/64 112/60 120/68   Blood Pressure (Exercise) 100/62 112/64 110/78 120/70 124/74   Blood Pressure (Exit) 106/60 126/70 124/76 130/62 124/64   Heart Rate (Admit) 86 bpm 92 bpm 86 bpm 92 bpm 76 bpm   Heart Rate (Exercise) 83 bpm 88 bpm 88 bpm 87 bpm 82 bpm   Heart Rate (Exit) 81 bpm 84 bpm 81 bpm 82 bpm 76 bpm   Oxygen Saturation (Admit) 92 % 92 % 94 % 92 % 95 %   Oxygen Saturation (Exercise) 91 % 90 % 91 % 92 % 93 %   Oxygen Saturation (Exit) 90 % 93 % 95 % 95 % 94 %   Rating of Perceived Exertion (Exercise) 11 11 13 12 12    Perceived Dyspnea (Exercise) 11 11 13 13 12    Duration Continue with 30 min of aerobic exercise without signs/symptoms of physical distress. Continue with 30 min of aerobic exercise without signs/symptoms of physical distress. Continue with 30 min of aerobic exercise without signs/symptoms of physical distress. Continue with 30 min of aerobic exercise without signs/symptoms of physical distress. Continue with 30 min of aerobic exercise without signs/symptoms of physical distress.   Intensity THRR unchanged THRR unchanged THRR unchanged THRR unchanged THRR unchanged     Progression   Progression Continue to progress workloads to maintain intensity without signs/symptoms of physical distress. Continue to progress workloads to maintain intensity without signs/symptoms of physical distress. Continue to progress workloads to maintain intensity without signs/symptoms of physical distress. Continue to progress workloads to maintain intensity without signs/symptoms of physical distress. Continue to progress workloads to maintain intensity without signs/symptoms of physical  distress.     Resistance Training   Training Prescription Yes Yes Yes Yes Yes   Weight 2 2 2 2 2    Reps 10-15 10-15 10-15 10-15 10-15   Time 10 Minutes 10 Minutes 10 Minutes 10 Minutes 10 Minutes     NuStep   Level 1 1 1 1 2    SPM 47 46 50 51 48   Minutes 39 39 39 39 39   METs 1.8 1.7 1.8 1.7 1.7    Row Name 02/08/23 1500 03/15/23 1500 04/12/23 1500         Response to Exercise   Blood Pressure (Admit) 102/58 112/58 120/68     Blood Pressure (Exercise) 102/60 110/62 --     Blood Pressure (Exit) 100/60 110/70 120/72     Heart Rate (Admit) 94 bpm 86 bpm 80 bpm     Heart Rate (Exercise) 82 bpm 90  bpm 84 bpm     Heart Rate (Exit) 86 bpm 93 bpm 87 bpm     Oxygen Saturation (Admit) 93 % 93 % 93 %     Oxygen Saturation (Exercise) 92 % 90 % 92 %     Oxygen Saturation (Exit) 92 % 93 % 93 %     Rating of Perceived Exertion (Exercise) 12 13 13      Perceived Dyspnea (Exercise) 12 3 2      Duration Continue with 30 min of aerobic exercise without signs/symptoms of physical distress. Continue with 30 min of aerobic exercise without signs/symptoms of physical distress. Continue with 30 min of aerobic exercise without signs/symptoms of physical distress.     Intensity THRR unchanged THRR unchanged THRR unchanged       Progression   Progression Continue to progress workloads to maintain intensity without signs/symptoms of physical distress. Continue to progress workloads to maintain intensity without signs/symptoms of physical distress. Continue to progress workloads to maintain intensity without signs/symptoms of physical distress.       Resistance Training   Training Prescription Yes Yes Yes     Weight 2 3 2  lbs     Reps 10-15 10-15 10-15     Time 10 Minutes -- --       NuStep   Level 2 1 2      SPM 54 62 74     Minutes 39 30 30     METs 1.7 1.7 1.5              Exercise Comments:   Exercise Comments     Row Name 05/13/23 1507           Exercise Comments Kimberly Graves graduated  today from  rehab with 19 sessions completed.  Details of the patient's exercise prescription and what She needs to do in order to continue the prescription and progress were discussed with patient.  Patient was given a copy of prescription and goals.  Patient verbalized understanding. Kimberly Graves plans to continue to exercise by walking at home.                Exercise Goals and Review:   Exercise Goals     Row Name 12/01/22 1453 12/29/22 1443 01/26/23 1038         Exercise Goals   Increase Physical Activity Yes Yes Yes     Intervention Provide advice, education, support and counseling about physical activity/exercise needs.;Develop an individualized exercise prescription for aerobic and resistive training based on initial evaluation findings, risk stratification, comorbidities and participant's personal goals. Provide advice, education, support and counseling about physical activity/exercise needs.;Develop an individualized exercise prescription for aerobic and resistive training based on initial evaluation findings, risk stratification, comorbidities and participant's personal goals. Provide advice, education, support and counseling about physical activity/exercise needs.;Develop an individualized exercise prescription for aerobic and resistive training based on initial evaluation findings, risk stratification, comorbidities and participant's personal goals.     Expected Outcomes Short Term: Attend rehab on a regular basis to increase amount of physical activity.;Long Term: Add in home exercise to make exercise part of routine and to increase amount of physical activity.;Long Term: Exercising regularly at least 3-5 days a week. Short Term: Attend rehab on a regular basis to increase amount of physical activity.;Long Term: Add in home exercise to make exercise part of routine and to increase amount of physical activity.;Long Term: Exercising regularly at least 3-5 days a week. Short Term: Attend rehab  on a regular basis  to increase amount of physical activity.;Long Term: Add in home exercise to make exercise part of routine and to increase amount of physical activity.;Long Term: Exercising regularly at least 3-5 days a week.     Increase Strength and Stamina Yes Yes Yes     Intervention Provide advice, education, support and counseling about physical activity/exercise needs.;Develop an individualized exercise prescription for aerobic and resistive training based on initial evaluation findings, risk stratification, comorbidities and participant's personal goals. Provide advice, education, support and counseling about physical activity/exercise needs.;Develop an individualized exercise prescription for aerobic and resistive training based on initial evaluation findings, risk stratification, comorbidities and participant's personal goals. Provide advice, education, support and counseling about physical activity/exercise needs.;Develop an individualized exercise prescription for aerobic and resistive training based on initial evaluation findings, risk stratification, comorbidities and participant's personal goals.     Expected Outcomes Short Term: Increase workloads from initial exercise prescription for resistance, speed, and METs.;Short Term: Perform resistance training exercises routinely during rehab and add in resistance training at home;Long Term: Improve cardiorespiratory fitness, muscular endurance and strength as measured by increased METs and functional capacity ( ) Short Term: Increase workloads from initial exercise prescription for resistance, speed, and METs.;Short Term: Perform resistance training exercises routinely during rehab and add in resistance training at home;Long Term: Improve cardiorespiratory fitness, muscular endurance and strength as measured by increased METs and functional capacity ( ) Short Term: Increase workloads from initial exercise prescription for resistance, speed, and  METs.;Short Term: Perform resistance training exercises routinely during rehab and add in resistance training at home;Long Term: Improve cardiorespiratory fitness, muscular endurance and strength as measured by increased METs and functional capacity ( )     Able to understand and use rate of perceived exertion (RPE) scale Yes Yes Yes     Intervention Provide education and explanation on how to use RPE scale Provide education and explanation on how to use RPE scale Provide education and explanation on how to use RPE scale     Expected Outcomes Short Term: Able to use RPE daily in rehab to express subjective intensity level;Long Term:  Able to use RPE to guide intensity level when exercising independently Short Term: Able to use RPE daily in rehab to express subjective intensity level;Long Term:  Able to use RPE to guide intensity level when exercising independently Short Term: Able to use RPE daily in rehab to express subjective intensity level;Long Term:  Able to use RPE to guide intensity level when exercising independently     Able to understand and use Dyspnea scale Yes Yes Yes     Intervention Provide education and explanation on how to use Dyspnea scale Provide education and explanation on how to use Dyspnea scale Provide education and explanation on how to use Dyspnea scale     Expected Outcomes Short Term: Able to use Dyspnea scale daily in rehab to express subjective sense of shortness of breath during exertion;Long Term: Able to use Dyspnea scale to guide intensity level when exercising independently Short Term: Able to use Dyspnea scale daily in rehab to express subjective sense of shortness of breath during exertion;Long Term: Able to use Dyspnea scale to guide intensity level when exercising independently Short Term: Able to use Dyspnea scale daily in rehab to express subjective sense of shortness of breath during exertion;Long Term: Able to use Dyspnea scale to guide intensity level when  exercising independently     Knowledge and understanding of Target Heart Rate Range (THRR) Yes Yes Yes     Intervention  Provide education and explanation of THRR including how the numbers were predicted and where they are located for reference Provide education and explanation of THRR including how the numbers were predicted and where they are located for reference Provide education and explanation of THRR including how the numbers were predicted and where they are located for reference     Expected Outcomes Short Term: Able to state/look up THRR;Short Term: Able to use daily as guideline for intensity in rehab;Long Term: Able to use THRR to govern intensity when exercising independently Short Term: Able to state/look up THRR;Short Term: Able to use daily as guideline for intensity in rehab;Long Term: Able to use THRR to govern intensity when exercising independently Short Term: Able to state/look up THRR;Short Term: Able to use daily as guideline for intensity in rehab;Long Term: Able to use THRR to govern intensity when exercising independently     Understanding of Exercise Prescription Yes Yes Yes     Intervention Provide education, explanation, and written materials on patient's individual exercise prescription Provide education, explanation, and written materials on patient's individual exercise prescription Provide education, explanation, and written materials on patient's individual exercise prescription     Expected Outcomes Long Term: Able to explain home exercise prescription to exercise independently;Short Term: Able to explain program exercise prescription Long Term: Able to explain home exercise prescription to exercise independently;Short Term: Able to explain program exercise prescription Long Term: Able to explain home exercise prescription to exercise independently;Short Term: Able to explain program exercise prescription              Exercise Goals Re-Evaluation :  Exercise Goals  Re-Evaluation     Row Name 12/01/22 1453 12/29/22 1443 01/26/23 1038 03/15/23 1517 03/29/23 0922     Exercise Goal Re-Evaluation   Exercise Goals Review Increase Physical Activity;Increase Strength and Stamina;Able to understand and use rate of perceived exertion (RPE) scale;Able to understand and use Dyspnea scale;Knowledge and understanding of Target Heart Rate Range (THRR);Understanding of Exercise Prescription Increase Physical Activity;Increase Strength and Stamina;Able to understand and use rate of perceived exertion (RPE) scale;Able to understand and use Dyspnea scale;Knowledge and understanding of Target Heart Rate Range (THRR);Understanding of Exercise Prescription Increase Physical Activity;Increase Strength and Stamina;Able to understand and use rate of perceived exertion (RPE) scale;Knowledge and understanding of Target Heart Rate Range (THRR);Able to check pulse independently;Understanding of Exercise Prescription Increase Physical Activity;Increase Strength and Stamina;Understanding of Exercise Prescription Increase Physical Activity;Understanding of Exercise Prescription;Increase Strength and Stamina   Comments Pt has completed 2 sessions of PR. She is deconditioned and will progress slowly in  the program. She is currently exercising at 1.8 METs on  the stepper. Will continue to monitor and progress as able., Pt has completed 5 sessions of PR. She is deconditioned and will progress slowly. She has started to increase her SPM on the stepper which is a small goal. She is currently exercising at 1.8 METs on the stepper. Will continue to monitor and progress as able . Pt has completed 9 sessions of PR. SHe continues to be deconditioned but has started to progress by increasing her level on the stepper. She is still has low SPM. She sometimes misses classes due to transpertation. She is currenttly exercising at 1.7 METs on the stepper. Will continue to monitor and progress as able. Kimberly Graves returned to  rehab today.  She has been out since 02/17/23 dealing with low blood pressures.  She has not been able to exercise as much due to not feeling as  well with low pressures. Kimberly Graves has not been back to rehab since 03/15/23. Her levels have been the same (level 1) due to being deconditioned. Will continue to montior and progress as able .   Expected Outcomes Through exercise at rehab and at home, the patient will meet their stated goals. Through exercise at rehab and at home, the patient will meet their stated goals. Through exercise at rehab and at home, the patient will meet their stated goals. Short: Get back to routine exercise Long: Continue to improve stamina Short: come to rehab regularly   long: continue to attend Pulm rehab    Row Name 04/12/23 1503 05/12/23 1300           Exercise Goal Re-Evaluation   Exercise Goals Review Increase Physical Activity;Increase Strength and Stamina;Understanding of Exercise Prescription --      Comments Kimberly Graves has been coming to rehab on and off. She was not feeling well this past week and missed rehab. I increased her level to level 2 and she stated that it feels good. She use to have in home physical therapy and she still uses the home exercise that he gave her to do exercising at home. She is decondtioned and will improve slowly. Kimberly Graves has not been to rehab since 04/12/2023 in over a month. Will update when able      Expected Outcomes Short: come to rehab regularly   long: continue to attend Pulm rehab --               Discharge Exercise Prescription (Final Exercise Prescription Changes):  Exercise Prescription Changes - 04/12/23 1500       Response to Exercise   Blood Pressure (Admit) 120/68    Blood Pressure (Exit) 120/72    Heart Rate (Admit) 80 bpm    Heart Rate (Exercise) 84 bpm    Heart Rate (Exit) 87 bpm    Oxygen Saturation (Admit) 93 %    Oxygen Saturation (Exercise) 92 %    Oxygen Saturation (Exit) 93 %    Rating of Perceived Exertion  (Exercise) 13    Perceived Dyspnea (Exercise) 2    Duration Continue with 30 min of aerobic exercise without signs/symptoms of physical distress.    Intensity THRR unchanged      Progression   Progression Continue to progress workloads to maintain intensity without signs/symptoms of physical distress.      Resistance Training   Training Prescription Yes    Weight 2 lbs    Reps 10-15      NuStep   Level 2    SPM 74    Minutes 30    METs 1.5             Nutrition:  Target Goals: Understanding of nutrition guidelines, daily intake of sodium 1500mg , cholesterol 200mg , calories 30% from fat and 7% or less from saturated fats, daily to have 5 or more servings of fruits and vegetables.  Biometrics:   Post Biometrics - 05/12/23 1600        Post  Biometrics   Height 5' (1.524 m)    Weight 73 kg    Waist Circumference 44 inches    Hip Circumference 45 inches    Waist to Hip Ratio 0.98 %    BMI (Calculated) 31.43    Grip Strength 16 kg             Nutrition Therapy Plan and Nutrition Goals:  Nutrition Therapy & Goals - 02/14/23 1408  Nutrition Therapy   RD appointment deferred Yes      Personal Nutrition Goals   Comments We provide educational material on heart healthy nutrition with handouts.      Intervention Plan   Intervention Nutrition handout(s) given to patient.    Expected Outcomes Short Term Goal: Understand basic principles of dietary content, such as calories, fat, sodium, cholesterol and nutrients.             Nutrition Assessments:  MEDIFICTS Score Key: >=70 Need to make dietary changes  40-70 Heart Healthy Diet <= 40 Therapeutic Level Cholesterol Diet   Picture Your Plate Scores: <47 Unhealthy dietary pattern with much room for improvement. 41-50 Dietary pattern unlikely to meet recommendations for good health and room for improvement. 51-60 More healthful dietary pattern, with some room for improvement.  >60 Healthy dietary  pattern, although there may be some specific behaviors that could be improved.    Nutrition Goals Re-Evaluation:  Nutrition Goals Re-Evaluation     Row Name 03/15/23 1521 04/12/23 1515           Goals   Nutrition Goal Mechanical Eating Healthy eating      Comment Kimberly Graves is working on her diet.  She has lost her appetitie and not hungry.  We talked about mechanical eating even when not hungry or using meal replacement shakes.  We talked about not wanting to go into caxeia and she needs the protein to not continue to get weaker.  She loves ice cream and was encouraged to get more of it. Kimberly Graves stated that she still does not eat a whole lot. She has one ensure a day normally near breakfest time. She said that she gets up late so her breakfest is normally around lunch time.      Expected Outcome Short: Try protein shakes Long: Continue to use mechanical eating to eat more Short: continue with protein shakes  Long: continue with machanical eating to eat more               Nutrition Goals Discharge (Final Nutrition Goals Re-Evaluation):  Nutrition Goals Re-Evaluation - 04/12/23 1515       Goals   Nutrition Goal Healthy eating    Comment Kimberly Graves stated that she still does not eat a whole lot. She has one ensure a day normally near breakfest time. She said that she gets up late so her breakfest is normally around lunch time.    Expected Outcome Short: continue with protein shakes  Long: continue with machanical eating to eat more             Psychosocial: Target Goals: Acknowledge presence or absence of significant depression and/or stress, maximize coping skills, provide positive support system. Participant is able to verbalize types and ability to use techniques and skills needed for reducing stress and depression.  Initial Review & Psychosocial Screening:   Quality of Life Scores:  Scores of 19 and below usually indicate a poorer quality of life in these areas.  A difference of   2-3 points is a clinically meaningful difference.  A difference of 2-3 points in the total score of the Quality of Life Index has been associated with significant improvement in overall quality of life, self-image, physical symptoms, and general health in studies assessing change in quality of life.   PHQ-9: Review Flowsheet       03/15/2023 10/19/2022  Depression screen PHQ 2/9  Decreased Interest 3 1  Down, Depressed, Hopeless 1 1  PHQ -  2 Score 4 2  Altered sleeping 3 3  Tired, decreased energy 3 3  Change in appetite 3 1  Feeling bad or failure about yourself  1 1  Trouble concentrating 1 1  Moving slowly or fidgety/restless 1 0  Suicidal thoughts 1 0  PHQ-9 Score 17 11  Difficult doing work/chores Somewhat difficult Somewhat difficult    Details           Interpretation of Total Score  Total Score Depression Severity:  1-4 = Minimal depression, 5-9 = Mild depression, 10-14 = Moderate depression, 15-19 = Moderately severe depression, 20-27 = Severe depression   Psychosocial Evaluation and Intervention:   Psychosocial Re-Evaluation:  Psychosocial Re-Evaluation     Row Name 11/19/22 1339 12/20/22 1431 01/17/23 1301 02/14/23 1409 03/15/23 1524     Psychosocial Re-Evaluation   Current issues with History of Depression;None Identified;Current Psychotropic Meds History of Depression;Current Psychotropic Meds History of Depression;Current Psychotropic Meds History of Depression;Current Psychotropic Meds History of Depression;Current Psychotropic Meds   Comments Patient plans to start soon.  Her initial PHQ-7 score is 14 and QOL score is 20.77%.  Patient was referred to PR with COPD via Dr. Renaldo Reel.  Her personal goals are to decrease her SOB with exertion, go up stairs without SOB and gain confidence to go back shopping. Patient has started with attending only 3 sessions.  Her attendance is limited due to transportation problems.  Patient was referred to PR with COPD via Dr. Renaldo Reel.   She reports having a history of depression that is managed with Buspar and citalopram.  She seems to enjoy coming to class and interactive with others in class. We will contiue to monitor her as she progresses in the program. Patient has completed 7th sessions.  Her attendance is limited due to transportation problems.  Patient was referred to PR with COPD via Dr. Renaldo Reel.  She reports having a history of depression that is managed with Buspar and citalopram.  She seems to enjoy coming to class.  She continues to demonstrate a positive outlook and interactive with class and staff. We will contiue to monitor her as she progresses in the program. Patient has completed 12 sessions. Transportation is a barrier to her being able to attend. There are no other physhosocial barriers identified. Patient's depression continues to be managed with Buspar and citalopram.  She continues to  enjoy coming the sessions.  She continues to demonstrate a positive outlook and interactive with class and staff. We will contiue to monitor her as she progresses in the program. Kimberly Graves has been out dealing with low blood pressure issues. She had enjoyed her vacation, but came back to her pressures running too low.  She fatigues quickly and easily frustrated by it.  She lost her husband recently and she is still adjusting to that and has days that are harder than others.  Her PHQ was 17. She is on celexa to help. She continues to grieve her husband.   Expected Outcomes Patient will have no psychosocial issuses identified at discharge. Patient will have no psychosocial issuses identified at discharge. Patient will have no psychosocial issuses identified at discharge. Patient will have no psychosocial issuses identified at discharge. Short: Get back to routine exercise for mental boost Long: Continue to grieve for husband.   Interventions Stress management education;Relaxation education;Encouraged to attend Pulmonary Rehabilitation for the  exercise Stress management education;Relaxation education;Encouraged to attend Pulmonary Rehabilitation for the exercise Stress management education;Relaxation education;Encouraged to attend Pulmonary Rehabilitation for the exercise  Stress management education;Relaxation education;Encouraged to attend Pulmonary Rehabilitation for the exercise Stress management education;Encouraged to attend Pulmonary Rehabilitation for the exercise   Continue Psychosocial Services  No Follow up required No Follow up required No Follow up required No Follow up required Follow up required by staff    Row Name 04/12/23 1509             Psychosocial Re-Evaluation   Comments Reather stated that her BP has not been as low as it was the past week. She continues to fatigue quicky and stated that she goes home after rehab and takes a nap. She loves to read throughout the day and it helps her pass her time.       Expected Outcomes Short: Get back to routine exercise for mental boost Long: continue to find things, like reading, that she enjoys       Interventions Stress management education;Relaxation education;Encouraged to attend Pulmonary Rehabilitation for the exercise       Continue Psychosocial Services  Follow up required by staff                Psychosocial Discharge (Final Psychosocial Re-Evaluation):  Psychosocial Re-Evaluation - 04/12/23 1509       Psychosocial Re-Evaluation   Comments Kimberly Graves stated that her BP has not been as low as it was the past week. She continues to fatigue quicky and stated that she goes home after rehab and takes a nap. She loves to read throughout the day and it helps her pass her time.    Expected Outcomes Short: Get back to routine exercise for mental boost Long: continue to find things, like reading, that she enjoys    Interventions Stress management education;Relaxation education;Encouraged to attend Pulmonary Rehabilitation for the exercise    Continue Psychosocial Services   Follow up required by staff              Education: Education Goals: Education classes will be provided on a weekly basis, covering required topics. Participant will state understanding/return demonstration of topics presented.  Learning Barriers/Preferences:   Education Topics: How Lungs Work and Diseases: - Discuss the anatomy of the lungs and diseases that can affect the lungs, such as COPD.   Exercise: -Discuss the importance of exercise, FITT principles of exercise, normal and abnormal responses to exercise, and how to exercise safely.   Environmental Irritants: -Discuss types of environmental irritants and how to limit exposure to environmental irritants. Flowsheet Row PULMONARY REHAB OTHER RESPIRATORY from 05/12/2023 in [facility]  Date 02/17/23  Educator handout       Meds/Inhalers and oxygen: - Discuss respiratory medications, definition of an inhaler and oxygen, and the proper way to use an inhaler and oxygen. Flowsheet Row PULMONARY REHAB OTHER RESPIRATORY from 05/12/2023 in [place]  Date 11/25/22  Educator handout       Energy Saving Techniques: - Discuss methods to conserve energy and decrease shortness of breath when performing activities of daily living.  Flowsheet Row PULMONARY REHAB OTHER RESPIRATORY from 05/12/2023 in [place] [facility]  Date 12/02/22  Educator handout       Bronchial Hygiene / Breathing Techniques: - Discuss breathing mechanics, pursed-lip breathing technique,  proper posture, effective ways to clear airways, and other functional breathing techniques   Cleaning Equipment: - Provides group verbal and written instruction about the health risks of elevated stress, cause of high stress, and healthy ways to reduce stress.   Nutrition I: Fats: - Discuss the  types of cholesterol, what cholesterol does to the body, and how cholesterol levels can be  controlled. Flowsheet Row PULMONARY REHAB OTHER RESPIRATORY from 05/12/2023 in [facility]  Date 12/23/22  Educator handout       Nutrition II: Labels: -Discuss the different components of food labels and how to read food labels.   Respiratory Infections: - Discuss the signs and symptoms of respiratory infections, ways to prevent respiratory infections, and the importance of seeking medical treatment when having a respiratory infection.   Stress I: Signs and Symptoms: - Discuss the causes of stress, how stress may lead to anxiety and depression, and ways to limit stress. Flowsheet Row PULMONARY REHAB OTHER RESPIRATORY from 05/12/2023 in [facility]  Date 03/31/23  Educator HB  Instruction Review Code 1- Verbalizes Understanding       Stress II: Relaxation: -Discuss relaxation techniques to limit stress. Flowsheet Row PULMONARY REHAB OTHER RESPIRATORY from 05/12/2023 in [place] [facility]  Date 01/27/23  Educator handout       Oxygen for Home/Travel: - Discuss how to prepare for travel when on oxygen and proper ways to transport and store oxygen to ensure safety. Flowsheet Row PULMONARY REHAB OTHER RESPIRATORY from 05/12/2023 in [place] [facility]  Date 02/03/23  Educator handout       Knowledge Questionnaire Score:   Core Components/Risk Factors/Patient Goals at Admission:   Core Components/Risk Factors/Patient Goals Review:   Goals and Risk Factor Review     Row Name 11/19/22 1342 12/20/22 1443 01/17/23 1307 02/14/23 1411 03/15/23 1519     Core Components/Risk Factors/Patient Goals Review   Personal Goals Review Improve shortness of breath with ADL's;Develop more efficient breathing techniques such as purse lipped breathing and diaphragmatic breathing and practicing self-pacing with activity.;Other Improve shortness of breath with ADL's;Develop more efficient breathing techniques such as  purse lipped breathing and diaphragmatic breathing and practicing self-pacing with activity.;Other Improve shortness of breath with ADL's;Develop more efficient breathing techniques such as purse lipped breathing and diaphragmatic breathing and practicing self-pacing with activity.;Other Improve shortness of breath with ADL's;Develop more efficient breathing techniques such as purse lipped breathing and diaphragmatic breathing and practicing self-pacing with activity.;Other Weight Management/Obesity;Hypertension;Improve shortness of breath with ADL's;Increase knowledge of respiratory medications and ability to use respiratory devices properly.   Review Patient plans to start program soon.  Patient referred to PR with COPD via Dr. Renaldo Reel.  Her personal goals for the program is to decrease SOB with exertion, go up stairs without SOB and gain confidence to go back shopping without SOB.  We will continue to monitor her progress as she works toward meeting these goals. Patient has started program with only attending 3 sessions. Her low attendance is due to transportation problems.  Her personal goals for the program is to decrease SOB with exertion, go up stairs without SOB and gain confidence to go back shopping without SOB.  We will help her develpe a efficient breathing technique, such as purse lipped breathing and practice self pacing with increased activity.  We will continue to monitor her progress as she works toward meeting these goals. Patient has completed 7th sessions. Her low attendance is due to transportation problems.  Her personal goals for the program is to decrease SOB with exertion, go up stairs without SOB and gain confidence to go back shopping without SOB.  We will help her develope a efficient breathing technique, such as purse lipped breathing and practice self pacing with increased activity. Her  O2 sats average between 90%-94% without  any supplemental oxygen needed at  this time.  We will  continue to monitor her progress as she works toward meeting these goals. Patient has completed 12sessions. She is doing well in the program with inconsistent attendance due to transportation. Her current weight is 75.3 KG down 1 KG from last 30 day review. She exercises on RA with O2 sats averaging between 90%-92%. Her personal goals for the program is to decrease SOB with exertion, go up stairs without SOB and gain confidence to go back shopping without SOB.We will continue to monitor her progress as she works toward meeting these goals. Kimberly Graves returned to rehab after being out since 02/17/23  with low blood pressures.  She has not been feeling like herself with the low pressure and the doctors have told her to just keep moving and exercising.  She is also dealing with a cough.  She is losing weight from not eating and not being hungry. She has been struggling with her breathing more and with coughing more too.  She now has cough syrup to help.  She has been using her nebulizer and her daughter sets it up for her twice a day.  She is also using her inhalers daily too.   Expected Outcomes Patient will complete the program meeting both program and personal goals. Patient will complete the program meeting both program and personal goals. Patient will complete the program meeting both program and personal goals. Patient will complete the program meeting both program and personal goals. Short:Continue to keep close eye on pressures Long: Get back to routine exercise    Row Name 04/12/23 1519             Core Components/Risk Factors/Patient Goals Review   Personal Goals Review Weight Management/Obesity;Hypertension;Improve shortness of breath with ADL's;Increase knowledge of respiratory medications and ability to use respiratory devices properly.       Review Kimberly Graves still is improving from having low blood pressure and feeling bad. She is trying to keep moving and exercising to improve mental boost. She is still  having trouble with her breathing and coughing. She continues to use her nebulixer twice a day, her daughter sets it up for her. She continues to use her inhaler daily too.       Expected Outcomes Short:Continue to keep close eye on pressures Long: Get back to routine exercise                Core Components/Risk Factors/Patient Goals at Discharge (Final Review):   Goals and Risk Factor Review - 04/12/23 1519       Core Components/Risk Factors/Patient Goals Review   Personal Goals Review Weight Management/Obesity;Hypertension;Improve shortness of breath with ADL's;Increase knowledge of respiratory medications and ability to use respiratory devices properly.    Review Kimberly Graves still is improving from having low blood pressure and feeling bad. She is trying to keep moving and exercising to improve mental boost. She is still having trouble with her breathing and coughing. She continues to use her nebulixer twice a day, her daughter sets it up for her. She continues to use her inhaler daily too.    Expected Outcomes Short:Continue to keep close eye on pressures Long: Get back to routine exercise             ITP Comments:  ITP Comments     Row Name 02/23/23 1615 03/23/23 1522 04/20/23 1528 05/13/23 1507     ITP Comments 30 day review completed.  ITP sent to Dr.Jehanzeb Memon, Medical Director of  Pulmonary Rehab. Continue with ITP unless changes are made by physician. 30 day review completed. ITP sent to Dr.Jehanzeb Memon, Medical Director of  Pulmonary Rehab. Continue with ITP unless changes are made by physician. 30 day review completed. ITP sent to Dr.Jehanzeb Memon, Medical Director of  Pulmonary Rehab. Continue with ITP unless changes are made by physician. Kimberly Graves graduated today from  rehab with 19 sessions completed.  Details of the patient's exercise prescription and what She needs to do in order to continue the prescription and progress were discussed with patient.  Patient was given a  copy of prescription and goals.  Patient verbalized understanding. Kimberly Graves plans to continue to exercise by walking at home.             Comments: Discharge ITP

## 2023-05-17 ENCOUNTER — Encounter (HOSPITAL_COMMUNITY): Payer: Medicare Other

## 2023-05-19 ENCOUNTER — Encounter (HOSPITAL_COMMUNITY): Payer: Medicare Other

## 2023-05-20 ENCOUNTER — Other Ambulatory Visit: Payer: Self-pay | Admitting: "Endocrinology

## 2023-05-24 ENCOUNTER — Encounter (HOSPITAL_COMMUNITY): Payer: Medicare Other

## 2023-05-24 ENCOUNTER — Other Ambulatory Visit: Payer: Self-pay

## 2023-05-24 DIAGNOSIS — E1165 Type 2 diabetes mellitus with hyperglycemia: Secondary | ICD-10-CM

## 2023-05-24 MED ORDER — TRULICITY 4.5 MG/0.5ML ~~LOC~~ SOAJ
4.5000 mg | SUBCUTANEOUS | 2 refills | Status: DC
Start: 2023-05-24 — End: 2023-09-05

## 2023-05-26 ENCOUNTER — Encounter (HOSPITAL_COMMUNITY): Payer: Medicare Other

## 2023-05-31 ENCOUNTER — Encounter (HOSPITAL_COMMUNITY): Payer: Medicare Other

## 2023-06-02 ENCOUNTER — Encounter (HOSPITAL_COMMUNITY): Payer: Medicare Other

## 2023-06-07 ENCOUNTER — Encounter (HOSPITAL_COMMUNITY): Payer: Medicare Other

## 2023-06-09 ENCOUNTER — Encounter (HOSPITAL_COMMUNITY): Payer: Medicare Other

## 2023-06-14 ENCOUNTER — Other Ambulatory Visit: Payer: Self-pay | Admitting: "Endocrinology

## 2023-06-14 ENCOUNTER — Encounter (HOSPITAL_COMMUNITY): Payer: Medicare Other

## 2023-06-16 ENCOUNTER — Encounter (HOSPITAL_COMMUNITY): Payer: Medicare Other

## 2023-06-17 ENCOUNTER — Other Ambulatory Visit: Payer: Self-pay | Admitting: "Endocrinology

## 2023-06-26 ENCOUNTER — Other Ambulatory Visit: Payer: Self-pay | Admitting: "Endocrinology

## 2023-06-28 ENCOUNTER — Other Ambulatory Visit: Payer: Self-pay | Admitting: "Endocrinology

## 2023-06-29 ENCOUNTER — Other Ambulatory Visit: Payer: Self-pay | Admitting: "Endocrinology

## 2023-06-29 ENCOUNTER — Telehealth: Payer: Self-pay | Admitting: "Endocrinology

## 2023-06-29 NOTE — Telephone Encounter (Signed)
Pts daughter called and stated that they couldn't get her medicine refilled without an appointment.  Currently scheduled for October 12, 2023 as that was next opening.  Do you want me to move to overbook to get her in sooner?

## 2023-06-29 NOTE — Telephone Encounter (Signed)
Refills sent in

## 2023-07-20 ENCOUNTER — Emergency Department (HOSPITAL_COMMUNITY): Payer: Medicare Other

## 2023-07-20 ENCOUNTER — Other Ambulatory Visit: Payer: Self-pay

## 2023-07-20 ENCOUNTER — Encounter (HOSPITAL_COMMUNITY): Payer: Self-pay | Admitting: *Deleted

## 2023-07-20 ENCOUNTER — Observation Stay (HOSPITAL_COMMUNITY)
Admission: EM | Admit: 2023-07-20 | Discharge: 2023-07-21 | Disposition: A | Discharge status: Home / Self Care | Payer: Medicare Other | Attending: Internal Medicine | Admitting: Internal Medicine

## 2023-07-20 DIAGNOSIS — Z794 Long term (current) use of insulin: Secondary | ICD-10-CM | POA: Diagnosis not present

## 2023-07-20 DIAGNOSIS — Z7982 Long term (current) use of aspirin: Secondary | ICD-10-CM | POA: Insufficient documentation

## 2023-07-20 DIAGNOSIS — Z79899 Other long term (current) drug therapy: Secondary | ICD-10-CM | POA: Insufficient documentation

## 2023-07-20 DIAGNOSIS — Z85048 Personal history of other malignant neoplasm of rectum, rectosigmoid junction, and anus: Secondary | ICD-10-CM | POA: Insufficient documentation

## 2023-07-20 DIAGNOSIS — R0602 Shortness of breath: Secondary | ICD-10-CM | POA: Diagnosis present

## 2023-07-20 DIAGNOSIS — Z7985 Long-term (current) use of injectable non-insulin antidiabetic drugs: Secondary | ICD-10-CM | POA: Diagnosis not present

## 2023-07-20 DIAGNOSIS — Z87891 Personal history of nicotine dependence: Secondary | ICD-10-CM | POA: Insufficient documentation

## 2023-07-20 DIAGNOSIS — R911 Solitary pulmonary nodule: Secondary | ICD-10-CM

## 2023-07-20 DIAGNOSIS — E782 Mixed hyperlipidemia: Secondary | ICD-10-CM | POA: Diagnosis not present

## 2023-07-20 DIAGNOSIS — J9601 Acute respiratory failure with hypoxia: Principal | ICD-10-CM

## 2023-07-20 DIAGNOSIS — Z1152 Encounter for screening for COVID-19: Secondary | ICD-10-CM | POA: Diagnosis not present

## 2023-07-20 DIAGNOSIS — R079 Chest pain, unspecified: Secondary | ICD-10-CM | POA: Diagnosis not present

## 2023-07-20 DIAGNOSIS — I10 Essential (primary) hypertension: Secondary | ICD-10-CM | POA: Diagnosis not present

## 2023-07-20 DIAGNOSIS — E1165 Type 2 diabetes mellitus with hyperglycemia: Secondary | ICD-10-CM | POA: Diagnosis not present

## 2023-07-20 DIAGNOSIS — J441 Chronic obstructive pulmonary disease with (acute) exacerbation: Principal | ICD-10-CM

## 2023-07-20 DIAGNOSIS — F39 Unspecified mood [affective] disorder: Secondary | ICD-10-CM | POA: Diagnosis present

## 2023-07-20 LAB — BASIC METABOLIC PANEL
Anion gap: 12 (ref 5–15)
BUN: 28 mg/dL — ABNORMAL HIGH (ref 8–23)
CO2: 21 mmol/L — ABNORMAL LOW (ref 22–32)
Calcium: 9.8 mg/dL (ref 8.9–10.3)
Chloride: 103 mmol/L (ref 98–111)
Creatinine, Ser: 0.77 mg/dL (ref 0.44–1.00)
GFR, Estimated: >60 mL/min (ref >60)
Glucose, Bld: 233 mg/dL — ABNORMAL HIGH (ref 70–99)
Potassium: 4 mmol/L (ref 3.5–5.1)
Sodium: 136 mmol/L (ref 135–145)

## 2023-07-20 LAB — CBC
HCT: 47.4 % — ABNORMAL HIGH (ref 36.0–46.0)
Hemoglobin: 15.3 g/dL — ABNORMAL HIGH (ref 12.0–15.0)
MCH: 29.3 pg (ref 26.0–34.0)
MCHC: 32.3 g/dL (ref 30.0–36.0)
MCV: 90.6 fL (ref 80.0–100.0)
Platelets: 343 10*3/uL (ref 150–400)
RBC: 5.23 MIL/uL — ABNORMAL HIGH (ref 3.87–5.11)
RDW: 14.1 % (ref 11.5–15.5)
WBC: 9.1 10*3/uL (ref 4.0–10.5)
nRBC: 0 % (ref 0.0–0.2)

## 2023-07-20 LAB — RESP PANEL BY RT-PCR (RSV, FLU A&B, COVID)  RVPGX2
Influenza A by PCR: NEGATIVE
Influenza B by PCR: NEGATIVE
Resp Syncytial Virus by PCR: NEGATIVE
SARS Coronavirus 2 by RT PCR: NEGATIVE

## 2023-07-20 LAB — GLUCOSE, CAPILLARY: Glucose-Capillary: 264 mg/dL — ABNORMAL HIGH (ref 70–99)

## 2023-07-20 LAB — BRAIN NATRIURETIC PEPTIDE: B Natriuretic Peptide: 64 pg/mL (ref 0.0–100.0)

## 2023-07-20 LAB — TROPONIN I (HIGH SENSITIVITY)
Troponin I (High Sensitivity): 3 ng/L (ref <18)
Troponin I (High Sensitivity): 3 ng/L (ref <18)

## 2023-07-20 MED ORDER — PANTOPRAZOLE SODIUM 40 MG PO TBEC
40.0000 mg | DELAYED_RELEASE_TABLET | Freq: Every day | ORAL | Status: DC
  Administered 2023-07-21: 40 mg via ORAL
  Filled 2023-07-20: qty 1

## 2023-07-20 MED ORDER — INSULIN ASPART 100 UNIT/ML IJ SOLN
0.0000 [IU] | Freq: Every day | INTRAMUSCULAR | Status: DC
  Administered 2023-07-20: 3 [IU] via SUBCUTANEOUS

## 2023-07-20 MED ORDER — IPRATROPIUM-ALBUTEROL 0.5-2.5 (3) MG/3ML IN SOLN: 3.0000 mL | RESPIRATORY_TRACT | Status: DC | PRN

## 2023-07-20 MED ORDER — LEVETIRACETAM 500 MG PO TABS
500.0000 mg | ORAL_TABLET | Freq: Two times a day (BID) | ORAL | Status: DC
  Administered 2023-07-20 – 2023-07-21 (×2): 500 mg via ORAL
  Filled 2023-07-20 (×2): qty 1

## 2023-07-20 MED ORDER — POLYETHYLENE GLYCOL 3350 17 G PO PACK: 17.0000 g | PACK | Freq: Every day | ORAL | Status: DC | PRN

## 2023-07-20 MED ORDER — PREGABALIN 75 MG PO CAPS
75.0000 mg | ORAL_CAPSULE | Freq: Two times a day (BID) | ORAL | Status: DC
  Administered 2023-07-20 – 2023-07-21 (×2): 75 mg via ORAL
  Filled 2023-07-20 (×2): qty 1

## 2023-07-20 MED ORDER — INSULIN ASPART 100 UNIT/ML IJ SOLN
0.0000 [IU] | Freq: Three times a day (TID) | INTRAMUSCULAR | Status: DC
  Administered 2023-07-21: 3 [IU] via SUBCUTANEOUS

## 2023-07-20 MED ORDER — FLUTICASONE FUROATE-VILANTEROL 200-25 MCG/ACT IN AEPB
1.0000 | INHALATION_SPRAY | Freq: Every day | RESPIRATORY_TRACT | Status: DC
  Filled 2023-07-20: qty 28

## 2023-07-20 MED ORDER — IOHEXOL 350 MG/ML SOLN
75.0000 mL | Freq: Once | INTRAVENOUS | Status: AC | PRN
  Administered 2023-07-20: 75 mL via INTRAVENOUS

## 2023-07-20 MED ORDER — ONDANSETRON HCL 4 MG PO TABS: 4.0000 mg | ORAL_TABLET | Freq: Four times a day (QID) | ORAL | Status: DC | PRN

## 2023-07-20 MED ORDER — DOXYCYCLINE HYCLATE 100 MG IV SOLR
100.0000 mg | Freq: Once | INTRAVENOUS | Status: AC
  Administered 2023-07-20: 100 mg via INTRAVENOUS
  Filled 2023-07-20 (×2): qty 100

## 2023-07-20 MED ORDER — UMECLIDINIUM BROMIDE 62.5 MCG/ACT IN AEPB
1.0000 | INHALATION_SPRAY | Freq: Every day | RESPIRATORY_TRACT | Status: DC
  Filled 2023-07-20: qty 7

## 2023-07-20 MED ORDER — DOXYCYCLINE HYCLATE 100 MG PO TABS: 100.0000 mg | ORAL_TABLET | Freq: Two times a day (BID) | ORAL | Status: DC

## 2023-07-20 MED ORDER — METHYLPREDNISOLONE SODIUM SUCC 125 MG IJ SOLR
125.0000 mg | Freq: Once | INTRAMUSCULAR | Status: AC
  Administered 2023-07-20: 125 mg via INTRAVENOUS
  Filled 2023-07-20: qty 2

## 2023-07-20 MED ORDER — ALBUTEROL SULFATE (2.5 MG/3ML) 0.083% IN NEBU
2.5000 mg | INHALATION_SOLUTION | Freq: Once | RESPIRATORY_TRACT | Status: AC
  Administered 2023-07-20: 2.5 mg via RESPIRATORY_TRACT
  Filled 2023-07-20: qty 3

## 2023-07-20 MED ORDER — PREDNISONE 20 MG PO TABS
40.0000 mg | ORAL_TABLET | Freq: Every day | ORAL | Status: DC
  Filled 2023-07-20: qty 2

## 2023-07-20 MED ORDER — ENOXAPARIN SODIUM 40 MG/0.4ML IJ SOSY
40.0000 mg | PREFILLED_SYRINGE | INTRAMUSCULAR | Status: DC
  Administered 2023-07-20: 40 mg via SUBCUTANEOUS
  Filled 2023-07-20: qty 0.4

## 2023-07-20 MED ORDER — CITALOPRAM HYDROBROMIDE 20 MG PO TABS
20.0000 mg | ORAL_TABLET | Freq: Every day | ORAL | Status: DC
  Administered 2023-07-20 – 2023-07-21 (×2): 20 mg via ORAL
  Filled 2023-07-20 (×2): qty 1

## 2023-07-20 MED ORDER — IPRATROPIUM-ALBUTEROL 0.5-2.5 (3) MG/3ML IN SOLN
3.0000 mL | Freq: Once | RESPIRATORY_TRACT | Status: AC
  Administered 2023-07-20: 3 mL via RESPIRATORY_TRACT
  Filled 2023-07-20: qty 3

## 2023-07-20 MED ORDER — ONDANSETRON HCL 4 MG/2ML IJ SOLN: 4.0000 mg | Freq: Four times a day (QID) | INTRAMUSCULAR | Status: DC | PRN

## 2023-07-20 MED ORDER — CARVEDILOL 3.125 MG PO TABS
3.1250 mg | ORAL_TABLET | Freq: Two times a day (BID) | ORAL | Status: DC
  Administered 2023-07-21: 3.125 mg via ORAL
  Filled 2023-07-20: qty 1

## 2023-07-20 MED ORDER — BUSPIRONE HCL 5 MG PO TABS
10.0000 mg | ORAL_TABLET | Freq: Two times a day (BID) | ORAL | Status: DC
  Administered 2023-07-20 – 2023-07-21 (×2): 10 mg via ORAL
  Filled 2023-07-20 (×2): qty 2

## 2023-07-20 NOTE — ED Provider Notes (Signed)
[place] EMERGENCY DEPARTMENT AT [facility] Provider Note   CSN: [id number] Arrival date & time: 07/20/23  1312     History  Chief Complaint  Patient presents with   Chest Pain    Kallin Bolinsky is a 72 y.o. female.   Chest Pain   72 year old female presents emergency department with complaints of shortness of breath, chest pain.  Patient states that she has she has been with symptoms since Friday.  Reports initially having cough but states that her cough has since stopped.  Describes chest pain as central chest tightness without radiation.  Was seen on Friday at a urgent care and treated with Augmentin, doxycycline as well as prednisone given some concern for pneumonia/COPD exacerbation.  Patient does not report any improvement with said therapies.  Denies any fever, chills, abdominal pain, nausea, vomiting.  Has been using at home nebulizers with some improvement of symptoms.  Past medical history significant for MI, COPD, history of lung cancer, diabetes mellitus, hypertension, neuromuscular disorder, rectal cancer, hyperlipidemia  Home Medications Prior to Admission medications   Medication Sig Start Date End Date Taking? Authorizing Provider  acetaminophen (TYLENOL) 500 MG tablet Take 1,000 mg by mouth daily.   Yes [provider]  albuterol (PROVENTIL) (2.5 MG/3ML) 0.083% nebulizer solution Take 2.5 mg by nebulization every 6 (six) hours as needed for shortness of breath or wheezing. 11/07/22  Yes [provider]  albuterol (VENTOLIN HFA) 108 (90 Base) MCG/ACT inhaler Inhale 2 puffs into the lungs every 6 (six) hours as needed for shortness of breath. 06/21/20  Yes [provider]  amoxicillin-clavulanate (AUGMENTIN) 875-125 MG tablet Take 1 tablet by mouth 2 (two) times daily. 07/15/23 07/20/23 Yes [provider]  aspirin 81 MG chewable tablet Chew 81 mg by mouth at bedtime.   Yes [provider]  atorvastatin (LIPITOR)  80 MG tablet Take 1 tablet by mouth daily. 11/22/20  Yes [provider]  busPIRone (BUSPAR) 10 MG tablet Take 10 mg by mouth 2 (two) times daily. 11/01/20  Yes [provider]  carvedilol (COREG) 3.125 MG tablet Take 3.125 mg by mouth 2 (two) times daily with a meal. 11/01/20  Yes [provider]  citalopram (CELEXA) 20 MG tablet Take 20 mg by mouth daily. 11/01/20  Yes [provider]  Cranberry 200 MG CAPS Take 200 mg by mouth at bedtime.   Yes [provider]  Dulaglutide (TRULICITY) 4.5 MG/0.5ML SOPN Inject 4.5 mg into the skin once a week. 05/24/23  Yes [name], [name], MD  fenofibrate (TRICOR) 145 MG tablet Take 290 mg by mouth daily.   Yes [provider]  ibuprofen (ADVIL) 200 MG tablet Take 400 mg by mouth at bedtime.   Yes [provider]  levETIRAcetam (KEPPRA) 500 MG tablet Take 500 mg by mouth 2 (two) times daily.   Yes [provider]  loperamide (IMODIUM A-D) 2 MG tablet Take 2 mg by mouth 4 (four) times daily as needed for diarrhea or loose stools.   Yes [provider]  loratadine (CLARITIN) 10 MG tablet Take 10 mg by mouth daily.   Yes [provider]  MUCINEX 600 MG 12 hr tablet Take 600 mg by mouth 2 (two) times daily as needed for cough or to loosen phlegm. 03/19/23  Yes [provider]  Multiple Vitamin (MULTIVITAMIN) capsule Take 1 capsule by mouth daily.   Yes [provider]  omeprazole (PRILOSEC) 40 MG capsule Take 40 mg by mouth  daily before breakfast.   Yes [provider]  predniSONE (STERAPRED UNI-PAK 21 TAB) 10 MG (21) TBPK tablet Take 10 mg by mouth daily. 07/15/23  Yes [provider]  pregabalin (LYRICA) 75 MG capsule Take 75 mg by mouth 2 (two) times daily.   Yes [provider]  [name] 200-62.5-25 MCG/ACT AEPB Inhale 1 puff into the lungs daily. 07/05/21  Yes [provider]  trolamine salicylate (ASPERCREME) 10  % cream Apply 1 Application topically as needed for muscle pain.   Yes [provider]  VASCEPA 1 g capsule TAKE ONE CAPSULE BY MOUTH TWICE DAILY WITH FOOD 05/20/23  Yes [name], [name], MD  Blood Glucose Monitoring Suppl (ACCU-CHEK GUIDE ME) w/Device KIT 1 Piece by Does not apply route as directed. 12/19/20   Roma Kayser, MD  glucose blood (ACCU-CHEK GUIDE) test strip Use as instructed 12/19/20   Roma Kayser, MD  Insulin Pen Needle (B-D ULTRAFINE III SHORT PEN) 31G X 8 MM MISC 1 each by Does not apply route as directed. 02/22/22   Roma Kayser, MD      Allergies    Cefdinir, Codeine, Lorcet [hydrocodone-acetaminophen], and Percocet [oxycodone-acetaminophen]    Review of Systems   Review of Systems  Cardiovascular:  Positive for chest pain.  All other systems reviewed and are negative.   Physical Exam Updated Vital Signs BP 139/76   Pulse 68   Temp 97.9 F (36.6 C) (Oral)   Resp 18   Ht 5' (1.524 m)   Wt 75.3 kg   SpO2 91%   BMI 32.42 kg/m  Physical Exam Vitals and nursing note reviewed.  Constitutional:      General: She is not in acute distress.    Appearance: She is well-developed.  HENT:     Head: Normocephalic and atraumatic.  Eyes:     Conjunctiva/sclera: Conjunctivae normal.  Cardiovascular:     Rate and Rhythm: Normal rate and regular rhythm.  Pulmonary:     Effort: Pulmonary effort is normal. No respiratory distress.     Comments: Diffuse/rhonchi appreciated bilateral lung fields mainly expiratory in nature. Abdominal:     Palpations: Abdomen is soft.     Tenderness: There is no abdominal tenderness. There is no guarding.  Musculoskeletal:        General: No swelling.     Cervical back: Neck supple.     Right lower leg: No edema.     Left lower leg: No edema.  Skin:    General: Skin is warm and dry.     Capillary Refill: Capillary refill takes less than 2 seconds.  Neurological:     Mental Status: She is alert.   Psychiatric:        Mood and Affect: Mood normal.     ED Results / Procedures / Treatments   Labs (all labs ordered are listed, but only abnormal results are displayed) Labs Reviewed  BASIC METABOLIC PANEL - Abnormal; Notable for the following components:      Result Value   CO2 21 (*)    Glucose, Bld 233 (*)    BUN 28 (*)    All other components within normal limits  CBC - Abnormal; Notable for the following components:   RBC 5.23 (*)    Hemoglobin 15.3 (*)    HCT 47.4 (*)    All other components within normal limits  RESP PANEL BY RT-PCR (RSV, FLU A&B, COVID)  RVPGX2  BRAIN NATRIURETIC PEPTIDE  TROPONIN  I (HIGH SENSITIVITY)  TROPONIN I (HIGH SENSITIVITY)    EKG EKG Interpretation Date/Time:  Wednesday July 20 2023 13:29:13 EST Ventricular Rate:  73 PR Interval:  186 QRS Duration:  68 QT Interval:  404 QTC Calculation: 445 R Axis:   41  Text Interpretation: Normal sinus rhythm Cannot rule out Anterior infarct , age undetermined Abnormal ECG When compared with ECG of 14-Dec-2022 11:53, Borderline criteria for Inferior infarct are no longer Present Confirmed by Vanetta Mulders [phone]) on 07/20/2023 1:32:14 PM  Radiology CT Angio Chest PE W/Cm &/Or Wo Cm  Result Date: 07/20/2023 CLINICAL DATA:  Five day history of mid chest pain associated with shortness of breath. 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 EXAM: CT ANGIOGRAPHY CHEST WITH CONTRAST TECHNIQUE: Multidetector CT imaging of the chest was performed using the standard protocol during bolus administration of intravenous contrast. Multiplanar CT image reconstructions and MIPs were obtained to evaluate the vascular anatomy. RADIATION DOSE REDUCTION: This exam was performed according to the departmental dose-optimization program which includes automated exposure control, adjustment of the mA and/or kV according to patient size and/or use of iterative reconstruction technique.  CONTRAST:  75mL OMNIPAQUE IOHEXOL 350 MG/ML SOLN COMPARISON:  Same day chest radiograph FINDINGS: Cardiovascular: The study is high quality for the evaluation of pulmonary embolism. There are no filling defects in the central, lobar, segmental or subsegmental pulmonary artery branches to suggest acute pulmonary embolism. Great vessels are normal in course and caliber. Normal heart size. No significant pericardial fluid/thickening. Coronary artery calcifications and aortic atherosclerosis. Mediastinum/Nodes: Imaged thyroid gland without nodules meeting criteria for imaging follow-up by size. Normal esophagus. No pathologically enlarged axillary, supraclavicular, mediastinal, or hilar lymph nodes. Lungs/Pleura: The central airways are patent. Trace secretions within the trachea extending into the bilateral bronchi. Mild rightward deviation of the trachea. Moderate effacement of the upper lobe bronchus secondary to platelike consolidation and architectural distortion in the right upper lobe and right perihilar region with traction bronchiectasis and extending into the right lower lobe, likely secondary to postradiation changes. Bilateral lower lobe irregular scarring and scattered areas of subpleural reticulations, most notable in the right middle lobe. Additional areas of scarring and traction bronchiectasis along the medial left upper lobe. 6 x 3 mm subpleural left lower lobe nodule (6:77). No pneumothorax. No pleural effusion. Upper abdomen: Normal. Musculoskeletal: No acute or abnormal lytic or blastic osseous lesions. Review of the MIP images confirms the above findings. IMPRESSION: 1. No evidence of pulmonary embolism. 2. Postradiation changes in the right upper lobe and right perihilar region with moderate effacement of the upper lobe bronchus and mild rightward deviation of the trachea. 3. Bilateral lower lobe irregular scarring and scattered areas of subpleural reticulations, most notable in the right middle  lobe, likely postinfectious/postinflammatory. 4. A 6 x 3 mm subpleural left lower lobe nodule. No specific follow-up imaging recommended. 5. Aortic Atherosclerosis (ICD10-I70.0). Coronary artery calcifications. Assessment for potential risk factor modification, dietary therapy or pharmacologic therapy may be warranted, if clinically indicated. Electronically Signed   By: Agustin Cree M.D.   On: 07/20/2023 17:15   DG Chest 2 View  Result Date: 07/20/2023 CLINICAL DATA:  Chest pain, shortness of breath. EXAM: CHEST - 2 VIEW COMPARISON:  October 24, 2021. FINDINGS: Stable cardiomediastinal silhouette. Stable scarring is noted in right upper lobe. Mild bibasilar subsegmental atelectasis or scarring is noted. Bony thorax is unremarkable. IMPRESSION: Mild bibasilar subsegmental atelectasis or scarring. Electronically Signed   By: Zenda Alpers.D.  On: 07/20/2023 14:47    Procedures .Critical Care  Performed by: Peter Garter, PA Authorized by: Peter Garter, PA   Critical care provider statement:    Critical care time (minutes):  51   Critical care was necessary to treat or prevent imminent or life-threatening deterioration of the following conditions:  Respiratory failure   Critical care was time spent personally by me on the following activities:  Development of treatment plan with patient or surrogate, discussions with consultants, evaluation of patient's response to treatment, examination of patient, ordering and review of laboratory studies, ordering and review of radiographic studies, ordering and performing treatments and interventions, pulse oximetry, re-evaluation of patient's condition and review of old charts   I assumed direction of critical care for this patient from another provider in my specialty: no     Care discussed with: admitting provider       Medications Ordered in ED Medications  doxycycline (VIBRAMYCIN) 100 mg in dextrose 5 % 250 mL IVPB (100 mg Intravenous New  Bag/Given 07/20/23 1822)  ipratropium-albuterol (DUONEB) 0.5-2.5 (3) MG/3ML nebulizer solution 3 mL (3 mLs Nebulization Given 07/20/23 1524)  albuterol (PROVENTIL) (2.5 MG/3ML) 0.083% nebulizer solution 2.5 mg (2.5 mg Nebulization Given 07/20/23 1524)  iohexol (OMNIPAQUE) 350 MG/ML injection 75 mL (75 mLs Intravenous Contrast Given 07/20/23 1608)  methylPREDNISolone sodium succinate (SOLU-MEDROL) 125 mg/2 mL injection 125 mg (125 mg Intravenous Given 07/20/23 1817)  albuterol (PROVENTIL) (2.5 MG/3ML) 0.083% nebulizer solution 2.5 mg (2.5 mg Nebulization Given 07/20/23 1838)    ED Course/ Medical Decision Making/ A&P Clinical Course as of 07/20/23 1839  Wed Jul 20, 2023  1734 Repeat evaluation showed continued worsening oxygen saturation on room air despite patient being alert and communicative.  Were briefly improved with nebulized breathing therapy that was subsequently declined to levels of hypoxia.  Lowest recorded was around 85% on room air when patient was alert talking.  Just evaluated the patient and she was alert talking with oxygen saturations 90% on room air.  Patient's nasal cannula increased to 4 L.  Will obtain viral swab and pursue admission thereafter. [CR]  1838 Consulted hospitalist Dr. Mariea Clonts who agreed with admission and assume further treatment/care. [CR]    Clinical Course User Index [CR] Peter Garter, PA                                 Medical Decision Making Amount and/or Complexity of Data Reviewed Labs: ordered. Radiology: ordered.  Risk Prescription drug management. Decision regarding hospitalization.   This patient presents to the ED for concern of chest pain, shortness of breath, this involves an extensive number of treatment options, and is a complaint that carries with it a high risk of complications and morbidity.  The differential diagnosis includes ACS, PE, pneumothorax, pneumonia, viral URI, COPD, asthma, pericarditis/Myocarditis/tamponade,  other   Co morbidities that complicate the patient evaluation  See HPI   Additional history obtained:  Additional history obtained from EMR External records from outside source obtained and reviewed including hospital records   Lab Tests:  I Ordered, and personally interpreted labs.  The pertinent results include: No leukocytosis.  Polycythemia with a hemoglobin of 15.3.  Platelets within normal range.  Decrease in bicarb of 21 otherwise, S within normal limits.  BUN elevated 28 with normal creatinine/GFR.  Initial troponin of 3 with repeat 3.  BNP 64.  Respiratory viral panel negative.   Imaging Studies  ordered:  I ordered imaging studies including chest x-ray, CT angio chest PE I independently visualized and interpreted imaging which showed  Chest x-ray: Mild bibasilar atelectasis/scarring  CT angio chest PE: No evidence of PE. postradiation changes in right upper lobe and right perihilar regions.  Bilateral lower lobe irregular scarring/scattered area of slight pleural reticulations.  6 x 3 mm subpleural left lower lobe nodule.  Aortic atherosclerosis.  Coronary artery calcifications. I agree with the radiologist interpretation  Cardiac Monitoring: / EKG:  The patient was maintained on a cardiac monitor.  I personally viewed and interpreted the cardiac monitored which showed an underlying rhythm of: Normal sinus rhythm.   Consultations Obtained:  See ED course  Problem List / ED Course / Critical interventions / Medication management  Acute respiratory failure with hypoxia, COPD exacerbation I ordered medication including DuoNeb, albuterol   Reevaluation of the patient after these medicines showed that the patient improved I have reviewed the patients home medicines and have made adjustments as needed   Social Determinants of Health:  Cigarette use denies illicit drug use.   Test / Admission - Considered:  Acute respiratory failure with hypoxia, COPD  exacerbation Vitals signs significant for hypertension blood pressure 148/81. Otherwise within normal range and stable throughout visit. Laboratory/imaging studies significant for: See above 72 year old female presents emergency department with complaints of chest pain and shortness of breath.  Symptom onset Friday.  Initially seen at urgent care and diagnosed with possible pneumonia given prednisone, Augmentin in addition to patient's at home doxycycline.  Patient reports temporarily improved symptoms with nebulized breathing treatments in the outpatient setting but still with symptoms prompting visit today.  On exam, patient with diffuse wheeze/rhonchi bilateral lung fields.  Initial O2 sats in the mid to low 90s on room air.  Patient on no oxygen at baseline.  Patient with delta negative troponin, EKG as above; low suspicion for ACS.  CT PE study without obvious PE or pneumonia, pneumothorax or other acute cardiopulmonary abnormality.  Patient's O2 sats improved temporarily with nebulized breathing treatments while in the ED but continued to decline down to mid 80s when patient was awake talking laying in bed.  Patient was subsequently placed on 2 L and then 4 L nasal cannula with appropriate O2 sat response.  Will pursue admission given evidence of acute respiratory failure with hypoxia in the setting of COPD exacerbation with failed outpatient management of symptoms.  Treatment plan discussed at length with patient and she acknowledged understanding was agreeable to said plan.  Patient stable upon admission.        Final Clinical Impression(s) / ED Diagnoses Final diagnoses:  Lung nodule  Acute respiratory failure with hypoxia (HCC)  COPD exacerbation [facility])    Rx / DC Orders ED Discharge Orders     None         Peter Garter, [place] 07/20/23 1839    Vanetta Mulders, MD 07/25/23 [phone]

## 2023-07-20 NOTE — ED Notes (Signed)
ED TO INPATIENT HANDOFF REPORT  ED Nurse Name and Phone #:   Asher Muir RN  S Name/Age/Gender Kimberly Graves 72 y.o. female Room/Bed: APA11/APA11  Code Status   Code Status: Prior  Home/SNF/Other Home Patient oriented to: self, place, time, and situation Is this baseline? Yes   Triage Complete: Triage complete  Chief Complaint COPD with acute exacerbation (HCC) [J44.1]  Triage Note Pt with mid CP since Friday with SOB, denies any N/V or diaphoresis. Pt with hx of COPD and PNA.  States her cough "has stopped".    Allergies Allergies  Allergen Reactions   Cefdinir Other (See Comments)   Codeine Nausea And Vomiting   Lorcet [Hydrocodone-Acetaminophen] Nausea And Vomiting   Percocet [Oxycodone-Acetaminophen] Nausea And Vomiting    Level of Care/Admitting Diagnosis ED Disposition     ED Disposition  Admit   Condition  --   Comment  Hospital Area: [facility] [[id number]]  Level of Care: Telemetry [5]  Covid Evaluation: Asymptomatic - no recent exposure (last 10 days) testing not required  Diagnosis: COPD with acute exacerbation [facility]) [[id number]]  Admitting Physician: Cresenciano Lick  Attending Physician: Onnie Boer [phone]  Certification:: I certify this patient will need inpatient services for at least 2 midnights  Expected Medical Readiness: 07/22/2023          B Medical/Surgery History Past Medical History:  Diagnosis Date   COPD (chronic obstructive pulmonary disease) (HCC)    Diabetes mellitus, type II (HCC)    Hypertension    Myocardial infarction (HCC) 2020   Neuromuscular disorder (HCC)    Rectal cancer (HCC)    Small cell carcinoma (HCC) 2007   Past Surgical History:  Procedure Laterality Date   BIOPSY  12/16/2022   Procedure: BIOPSY;  Surgeon: [name], MD;  [facility];  Service: Gastroenterology;;   BREAST BIOPSY Right 08/04/2022   MM RT BREAST BX W LOC DEV 1ST LESION IMAGE BX SPEC STEREO  GUIDE 08/04/2022 [facility]   COLONOSCOPY WITH PROPOFOL N/A 12/16/2022   Procedure: COLONOSCOPY WITH PROPOFOL;  Surgeon: [name], MD;  Location[facility];  Service: Gastroenterology;  Laterality: N/A;  10:15AM;ASA 3   POLYPECTOMY  12/16/2022   Procedure: POLYPECTOMY;  Surgeon: [name], [name], MD;  Location: [facility];  Service: Gastroenterology;;   TUMOR REMOVAL  2019     A IV Location/Drains/Wounds Patient Lines/Drains/Airways Status     Active Line/Drains/Airways     Name Placement date Placement time Site Days   Peripheral IV 07/20/23 18 G Right Antecubital 07/20/23  1508  Antecubital  less than 1            Intake/Output Last 24 hours No intake or output data in the 24 hours ending 07/20/23 1846  Labs/Imaging Results for orders placed or performed during the hospital encounter of 07/20/23 (from the past 48 hour(s))  Basic metabolic panel     Status: Abnormal   Collection Time: 07/20/23  1:48 PM  Result Value Ref Range   Sodium 136 135 - 145 mmol/L   Potassium 4.0 3.5 - 5.1 mmol/L   Chloride 103 98 - 111 mmol/L   CO2 21 (L) 22 - 32 mmol/L   Glucose, Bld 233 (H) 70 - 99 mg/dL    Comment: Glucose reference range applies only to samples taken after fasting for at least 8 hours.   BUN 28 (H) 8 - 23 mg/dL   Creatinine, Ser 5.78 0.44 - 1.00 mg/dL  Calcium 9.8 8.9 - 10.3 mg/dL   GFR, Estimated >16 >10 mL/min    Comment: (NOTE) Calculated using the CKD-EPI Creatinine Equation (2021)    Anion gap 12 5 - 15    Comment: Performed at [facility], [address], [place], [place] [address]  CBC     Status: Abnormal   Collection Time: 07/20/23  1:48 PM  Result Value Ref Range   WBC 9.1 4.0 - 10.5 K/uL   RBC 5.23 (H) 3.87 - 5.11 MIL/uL   Hemoglobin 15.3 (H) 12.0 - 15.0 g/dL   HCT 40.9 (H) 81.1 - 91.4 %   MCV 90.6 80.0 - 100.0 fL   MCH 29.3 26.0 - 34.0 pg   MCHC 32.3 30.0 - 36.0 g/dL   RDW 78.2 95.6 - 21.3 %   Platelets 343 150 -  400 K/uL   nRBC 0.0 0.0 - 0.2 %    Comment: Performed at [facility], [address] [place], [place] [address]  Troponin I (High Sensitivity)     Status: None   Collection Time: 07/20/23  1:48 PM  Result Value Ref Range   Troponin I (High Sensitivity) 3 <18 ng/L    Comment: (NOTE) Elevated high sensitivity troponin I (hsTnI) values and significant  changes across serial measurements may suggest ACS but many other  chronic and acute conditions are known to elevate hsTnI results.  Refer to the "Links" section for chest pain algorithms and additional  guidance. Performed at [facility], [address] [place], [place] [address]   Brain natriuretic peptide     Status: None   Collection Time: 07/20/23  1:48 PM  Result Value Ref Range   B Natriuretic Peptide 64.0 0.0 - 100.0 pg/mL    Comment: Performed at [facility], [address] [place], [place] [address]  Troponin I (High Sensitivity)     Status: None   Collection Time: 07/20/23  3:51 PM  Result Value Ref Range   Troponin I (High Sensitivity) 3 <18 ng/L    Comment: (NOTE) Elevated high sensitivity troponin I (hsTnI) values and significant  changes across serial measurements may suggest ACS but many other  chronic and acute conditions are known to elevate hsTnI results.  Refer to the "Links" section for chest pain algorithms and additional  guidance. Performed at [facility], [address], [place], [place] [address]   Resp panel by RT-PCR (RSV, Flu A&B, Covid) Anterior Nasal Swab     Status: None   Collection Time: 07/20/23  5:22 PM   Specimen: Anterior Nasal Swab  Result Value Ref Range   SARS Coronavirus 2 by RT PCR NEGATIVE NEGATIVE    Comment: (NOTE) SARS-CoV-2 target nucleic acids are NOT DETECTED.  The SARS-CoV-2 RNA is generally detectable in upper respiratory specimens during the acute phase of infection. The lowest concentration of SARS-CoV-2 viral copies this assay can detect is 138 copies/mL. A negative  result does not preclude SARS-Cov-2 infection and should not be used as the sole basis for treatment or other patient management decisions. A negative result may occur with  improper specimen collection/handling, submission of specimen other than nasopharyngeal swab, presence of viral mutation(s) within the areas targeted by this assay, and inadequate number of viral copies(<138 copies/mL). A negative result must be combined with clinical observations, patient history, and epidemiological information. The expected result is Negative.  Fact Sheet for Patients:  BloggerCourse.com  Fact Sheet for Healthcare Providers:  SeriousBroker.it  This test is no t yet approved or  cleared by the [place] and  has been authorized for detection and/or diagnosis of SARS-CoV-2 by FDA under an Emergency Use Authorization (EUA). This EUA will remain  in effect (meaning this test can be used) for the duration of the COVID-19 declaration under Section 564(b)(1) of the Act, 21 U.S.C.section 360bbb-3(b)(1), unless the authorization is terminated  or revoked sooner.       Influenza A by PCR NEGATIVE NEGATIVE   Influenza B by PCR NEGATIVE NEGATIVE    Comment: (NOTE) The Xpert Xpress SARS-CoV-2/FLU/RSV plus assay is intended as an aid in the diagnosis of influenza from Nasopharyngeal swab specimens and should not be used as a sole basis for treatment. Nasal washings and aspirates are unacceptable for Xpert Xpress SARS-CoV-2/FLU/RSV testing.  Fact Sheet for Patients: BloggerCourse.com  Fact Sheet for Healthcare Providers: SeriousBroker.it  This test is not yet approved or cleared by the [place] FDA and has been authorized for detection and/or diagnosis of SARS-CoV-2 by FDA under an Emergency Use Authorization (EUA). This EUA will remain in effect (meaning this test can be used) for the  duration of the COVID-19 declaration under Section 564(b)(1) of the Act, 21 U.S.C. section 360bbb-3(b)(1), unless the authorization is terminated or revoked.     Resp Syncytial Virus by PCR NEGATIVE NEGATIVE    Comment: (NOTE) Fact Sheet for Patients: BloggerCourse.com  Fact Sheet for Healthcare Providers: SeriousBroker.it  This test is not yet approved or cleared by the [place] FDA and has been authorized for detection and/or diagnosis of SARS-CoV-2 by FDA under an Emergency Use Authorization (EUA). This EUA will remain in effect (meaning this test can be used) for the duration of the COVID-19 declaration under Section 564(b)(1) of the Act, 21 U.S.C. section 360bbb-3(b)(1), unless the authorization is terminated or revoked.  Performed at [facility], [address], [place], [place] [address]    CT Angio Chest PE W/Cm &/Or Wo Cm  Result Date: 07/20/2023 CLINICAL DATA:  Five day history of mid chest pain associated with shortness of breath. 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 EXAM: CT ANGIOGRAPHY CHEST WITH CONTRAST TECHNIQUE: Multidetector CT imaging of the chest was performed using the standard protocol during bolus administration of intravenous contrast. Multiplanar CT image reconstructions and MIPs were obtained to evaluate the vascular anatomy. RADIATION DOSE REDUCTION: This exam was performed according to the departmental dose-optimization program which includes automated exposure control, adjustment of the mA and/or kV according to patient size and/or use of iterative reconstruction technique. CONTRAST:  75mL OMNIPAQUE IOHEXOL 350 MG/ML SOLN COMPARISON:  Same day chest radiograph FINDINGS: Cardiovascular: The study is high quality for the evaluation of pulmonary embolism. There are no filling defects in the central, lobar, segmental or subsegmental pulmonary artery branches to suggest acute  pulmonary embolism. Great vessels are normal in course and caliber. Normal heart size. No significant pericardial fluid/thickening. Coronary artery calcifications and aortic atherosclerosis. Mediastinum/Nodes: Imaged thyroid gland without nodules meeting criteria for imaging follow-up by size. Normal esophagus. No pathologically enlarged axillary, supraclavicular, mediastinal, or hilar lymph nodes. Lungs/Pleura: The central airways are patent. Trace secretions within the trachea extending into the bilateral bronchi. Mild rightward deviation of the trachea. Moderate effacement of the upper lobe bronchus secondary to platelike consolidation and architectural distortion in the right upper lobe and right perihilar region with traction bronchiectasis and extending into the right lower lobe, likely secondary to postradiation changes. Bilateral lower lobe irregular scarring and scattered areas of subpleural reticulations, most notable  in the right middle lobe. Additional areas of scarring and traction bronchiectasis along the medial left upper lobe. 6 x 3 mm subpleural left lower lobe nodule (6:77). No pneumothorax. No pleural effusion. Upper abdomen: Normal. Musculoskeletal: No acute or abnormal lytic or blastic osseous lesions. Review of the MIP images confirms the above findings. IMPRESSION: 1. No evidence of pulmonary embolism. 2. Postradiation changes in the right upper lobe and right perihilar region with moderate effacement of the upper lobe bronchus and mild rightward deviation of the trachea. 3. Bilateral lower lobe irregular scarring and scattered areas of subpleural reticulations, most notable in the right middle lobe, likely postinfectious/postinflammatory. 4. A 6 x 3 mm subpleural left lower lobe nodule. No specific follow-up imaging recommended. 5. Aortic Atherosclerosis (ICD10-I70.0). Coronary artery calcifications. Assessment for potential risk factor modification, dietary therapy or pharmacologic therapy  may be warranted, if clinically indicated. Electronically Signed   By: Agustin Cree M.D.   On: 07/20/2023 17:15   DG Chest 2 View  Result Date: 07/20/2023 CLINICAL DATA:  Chest pain, shortness of breath. EXAM: CHEST - 2 VIEW COMPARISON:  October 24, 2021. FINDINGS: Stable cardiomediastinal silhouette. Stable scarring is noted in right upper lobe. Mild bibasilar subsegmental atelectasis or scarring is noted. Bony thorax is unremarkable. IMPRESSION: Mild bibasilar subsegmental atelectasis or scarring. Electronically Signed   By: Lupita Raider M.D.   On: 07/20/2023 14:47    Pending Labs Unresulted Labs (From admission, onward)    None       Vitals/Pain Today's Vitals   07/20/23 1717 07/20/23 1720 07/20/23 1830 07/20/23 1841  BP:   139/83   Pulse: 72 68 72   Resp: 14 18 16    Temp:      TempSrc:      SpO2: (!) 87% 91% 96% 97%  Weight:      Height:      PainSc:        Isolation Precautions No active isolations  Medications Medications  doxycycline (VIBRAMYCIN) 100 mg in dextrose 5 % 250 mL IVPB (100 mg Intravenous New Bag/Given 07/20/23 1822)  ipratropium-albuterol (DUONEB) 0.5-2.5 (3) MG/3ML nebulizer solution 3 mL (3 mLs Nebulization Given 07/20/23 1524)  albuterol (PROVENTIL) (2.5 MG/3ML) 0.083% nebulizer solution 2.5 mg (2.5 mg Nebulization Given 07/20/23 1524)  iohexol (OMNIPAQUE) 350 MG/ML injection 75 mL (75 mLs Intravenous Contrast Given 07/20/23 1608)  methylPREDNISolone sodium succinate (SOLU-MEDROL) 125 mg/2 mL injection 125 mg (125 mg Intravenous Given 07/20/23 1817)  albuterol (PROVENTIL) (2.5 MG/3ML) 0.083% nebulizer solution 2.5 mg (2.5 mg Nebulization Given 07/20/23 1838)    Mobility walks with device     Focused Assessments Cardiac Assessment Handoff:  Cardiac Rhythm: Normal sinus rhythm No results found for: "CKTOTAL", "CKMB", "CKMBINDEX", "TROPONINI" Lab Results  Component Value Date   DDIMER 0.45 10/24/2021   Does the Patient currently have chest  pain? No    R Recommendations: See Admitting Provider Note  Report given to:   Additional Notes: Patient's Daughter stated patient uses Oxygen PRN at home.

## 2023-07-20 NOTE — Assessment & Plan Note (Addendum)
Stable. -Resume carvedilol

## 2023-07-20 NOTE — ED Notes (Signed)
Patient Oxygen sat dropped to 86-88% on room air. 2L of o2 via Nasal Cannula placed on patient MD, made aware.

## 2023-07-20 NOTE — Assessment & Plan Note (Signed)
O2 sats down to 86% on room air.  Rest on 2 L.  Likely secondary to COPD exacerbation.  CTA chest negative for PE, with postinfectious/postinflammatory scarring.

## 2023-07-20 NOTE — H&P (Signed)
History and Physical    Kimberly Graves WUJ:[id number] DOB: February 25, 1951 DOA: 07/20/2023  PCP: [name], MD   Patient coming from: Home  I have personally briefly reviewed patient's old medical records in [facility]  Chief Complaint: Difficulty Breathing  HPI: Kimberly Graves is a 72 y.o. female with medical history significant for hypertension, rectal cancer, diabetes mellitus, COPD. Patient presented to the ED with complaints of difficulty breathing for about 2 weeks duration, she initially had a cough also but that has improved.  She went to her outpatient provider, was started on doxycycline- 12/4, and subsequently went to an urgent care- 12/6 and Augmentin was added.  She took both medications.  She reports some mid lower chest pain nonradiating.  No lower extremity swelling.  She is on home O2 but does not need it. When she started taking the antibiotics, she reports having severe diarrhea for which she was taking loperamide up to 4 more tablets daily.  Last bowel movement was today and was normal consistency but she took loperamide .  No abdominal pain.  No vomiting.  No fevers no chills.  ED Course: O2 sats down to 86% on room air, placed on 2 L, Tmax 99.1.  Heart rate 60s to 70s.  Blood pressure systolic 137-155. BNP 64.  COVID-negative.  Chest x-ray-atelectasis or scarring.  CTA chest-no PE, shows postradiation changes, bilateral lower lobe irregular scarring likely postinfectious/postinflammatory. Wheezing and rhonchi appreciated in ED, DuoNebs, doxycycline, given.  Hospitalist to admit for acute hypoxic respiratory failure secondary to COPD exacerbation.   Review of Systems: As per HPI all other systems reviewed and negative.  Past Medical History:  Diagnosis Date   COPD (chronic obstructive pulmonary disease) (HCC)    Diabetes mellitus, type II (HCC)    Hypertension    Myocardial infarction (HCC) 2020   Neuromuscular disorder (HCC)    Rectal cancer (HCC)    Small  cell carcinoma (HCC) 2007    Past Surgical History:  Procedure Laterality Date   BIOPSY  12/16/2022   Procedure: BIOPSY;  Surgeon: [name], MD;  Location: [facility];  Service: Gastroenterology;;   BREAST BIOPSY Right 08/04/2022   MM RT BREAST BX W LOC DEV 1ST LESION IMAGE BX SPEC STEREO GUIDE 08/04/2022 [facility]   COLONOSCOPY WITH PROPOFOL N/A 12/16/2022   Procedure: COLONOSCOPY WITH PROPOFOL;  Surgeon: [name], MD;  Location: [facility];  Service: Gastroenterology;  Laterality: N/A;  10:15AM;ASA 3   POLYPECTOMY  12/16/2022   Procedure: POLYPECTOMY;  Surgeon: [name], [name], MD;  Location: [facility];  Service: Gastroenterology;;   TUMOR REMOVAL  2019     reports that she quit smoking about 17 years ago. Her smoking use included cigarettes. She has been exposed to tobacco smoke. She does not have any smokeless tobacco history on file. She reports that she does not currently use alcohol. She reports that she does not use drugs.  Allergies  Allergen Reactions   Cefdinir Other (See Comments)   Codeine Nausea And Vomiting   Lorcet [Hydrocodone-Acetaminophen] Nausea And Vomiting   Percocet [Oxycodone-Acetaminophen] Nausea And Vomiting    Family History  Problem Relation Age of Onset   Hypertension Mother    Cancer Mother    Hypertension Father    Prior to Admission medications   Medication Sig Start Date End Date Taking? Authorizing Provider  acetaminophen (TYLENOL) 500 MG tablet Take 1,000 mg by mouth daily.   Yes [provider]  albuterol (PROVENTIL) (  2.5 MG/3ML) 0.083% nebulizer solution Take 2.5 mg by nebulization every 6 (six) hours as needed for shortness of breath or wheezing. 11/07/22  Yes [provider]  albuterol (VENTOLIN HFA) 108 (90 Base) MCG/ACT inhaler Inhale 2 puffs into the lungs every 6 (six) hours as needed for shortness of breath. 06/21/20  Yes [provider]   amoxicillin-clavulanate (AUGMENTIN) 875-125 MG tablet Take 1 tablet by mouth 2 (two) times daily. 07/15/23 07/20/23 Yes [provider]  aspirin 81 MG chewable tablet Chew 81 mg by mouth at bedtime.   Yes [provider]  atorvastatin (LIPITOR) 80 MG tablet Take 1 tablet by mouth daily. 11/22/20  Yes [provider]  busPIRone (BUSPAR) 10 MG tablet Take 10 mg by mouth 2 (two) times daily. 11/01/20  Yes [provider]  carvedilol (COREG) 3.125 MG tablet Take 3.125 mg by mouth 2 (two) times daily with a meal. 11/01/20  Yes [provider]  citalopram (CELEXA) 20 MG tablet Take 20 mg by mouth daily. 11/01/20  Yes [provider]  Cranberry 200 MG CAPS Take 200 mg by mouth at bedtime.   Yes [provider]  Dulaglutide (TRULICITY) 4.5 MG/0.5ML SOPN Inject 4.5 mg into the skin once a week. 05/24/23  Yes [name], [name], MD  fenofibrate (TRICOR) 145 MG tablet Take 290 mg by mouth daily.   Yes [provider]  ibuprofen (ADVIL) 200 MG tablet Take 400 mg by mouth at bedtime.   Yes [provider]  levETIRAcetam (KEPPRA) 500 MG tablet Take 500 mg by mouth 2 (two) times daily.   Yes [provider]  loperamide (IMODIUM A-D) 2 MG tablet Take 2 mg by mouth 4 (four) times daily as needed for diarrhea or loose stools.   Yes [provider]  loratadine (CLARITIN) 10 MG tablet Take 10 mg by mouth daily.   Yes [provider]  MUCINEX 600 MG 12 hr tablet Take 600 mg by mouth 2 (two) times daily as needed for cough or to loosen phlegm. 03/19/23  Yes [provider]  Multiple Vitamin (MULTIVITAMIN) capsule Take 1 capsule by mouth daily.   Yes [provider]  omeprazole (PRILOSEC) 40 MG capsule Take 40 mg by mouth daily before breakfast.   Yes [provider]  predniSONE (STERAPRED UNI-PAK 21 TAB) 10 MG (21) TBPK tablet Take 10 mg by mouth daily. 07/15/23  Yes [provider]   pregabalin (LYRICA) 75 MG capsule Take 75 mg by mouth 2 (two) times daily.   Yes [provider]  [name] 200-62.5-25 MCG/ACT AEPB Inhale 1 puff into the lungs daily. 07/05/21  Yes [provider]  trolamine salicylate (ASPERCREME) 10 % cream Apply 1 Application topically as needed for muscle pain.   Yes [provider]  VASCEPA 1 g capsule TAKE ONE CAPSULE BY MOUTH TWICE DAILY WITH FOOD 05/20/23  Yes [name], [name], MD  Blood Glucose Monitoring Suppl (ACCU-CHEK GUIDE ME) w/Device KIT 1 Piece by Does not apply route as directed. 12/19/20   Roma Kayser, MD  glucose blood (ACCU-CHEK GUIDE) test strip Use as instructed 12/19/20   Roma Kayser, MD  Insulin Pen Needle (B-D ULTRAFINE III SHORT PEN) 31G X 8 MM MISC 1 each by Does not apply route as directed. 02/22/22   Roma Kayser, MD    Physical Exam: Vitals:   07/20/23 1700 07/20/23 1715 07/20/23 1717 07/20/23 1720  BP:      Pulse: 73 70 72  68  Resp: 17 18 14 18   Temp: 97.9 F (36.6 C)     TempSrc: Oral     SpO2: (!) 86% (!) 88% (!) 87% 91%  Weight:      Height:        Constitutional: NAD, calm, comfortable Vitals:   07/20/23 1700 07/20/23 1715 07/20/23 1717 07/20/23 1720  BP:      Pulse: 73 70 72 68  Resp: 17 18 14 18   Temp: 97.9 F (36.6 C)     TempSrc: Oral     SpO2: (!) 86% (!) 88% (!) 87% 91%  Weight:      Height:       Eyes: PERRL, lids and conjunctivae normal ENMT: Mucous membranes are moist.  Neck: normal, supple, no masses, no thyromegaly Respiratory: clear to auscultation bilaterally, no wheezing, no crackles. Normal respiratory effort. No accessory muscle use.  Cardiovascular: Regular rate and rhythm, no murmurs / rubs / gallops. No extremity edema.  Extremities warm. Abdomen: no tenderness, no masses palpated. No hepatosplenomegaly. Bowel sounds positive.  Musculoskeletal: no clubbing / cyanosis. No joint deformity upper and lower extremities.   Skin: no rashes, lesions, ulcers. No induration Neurologic: No facial asymmetry, moving extremities spontaneously. Psychiatric: Normal judgment and insight. Alert and oriented x 3. Normal mood.   Labs on Admission: I have personally reviewed following labs and imaging studies  CBC: Recent Labs  Lab 07/20/23 1348  WBC 9.1  HGB 15.3*  HCT 47.4*  MCV 90.6  PLT 343   Basic Metabolic Panel: Recent Labs  Lab 07/20/23 1348  NA 136  K 4.0  CL 103  CO2 21*  GLUCOSE 233*  BUN 28*  CREATININE 0.77  CALCIUM 9.8   Urine analysis:    Component Value Date/Time   COLORURINE YELLOW 10/25/2021 0255   APPEARANCEUR CLEAR 10/25/2021 0255   LABSPEC 1.016 10/25/2021 0255   PHURINE 5.0 10/25/2021 0255   GLUCOSEU >=500 (A) 10/25/2021 0255   HGBUR NEGATIVE 10/25/2021 0255   BILIRUBINUR NEGATIVE 10/25/2021 0255   KETONESUR NEGATIVE 10/25/2021 0255   PROTEINUR NEGATIVE 10/25/2021 0255   NITRITE NEGATIVE 10/25/2021 0255   LEUKOCYTESUR MODERATE (A) 10/25/2021 0255    Radiological Exams on Admission: CT Angio Chest PE W/Cm &/Or Wo Cm  Result Date: 07/20/2023 CLINICAL DATA:  Five day history of mid chest pain associated with shortness of breath. 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 EXAM: CT ANGIOGRAPHY CHEST WITH CONTRAST TECHNIQUE: Multidetector CT imaging of the chest was performed using the standard protocol during bolus administration of intravenous contrast. Multiplanar CT image reconstructions and MIPs were obtained to evaluate the vascular anatomy. RADIATION DOSE REDUCTION: This exam was performed according to the departmental dose-optimization program which includes automated exposure control, adjustment of the mA and/or kV according to patient size and/or use of iterative reconstruction technique. CONTRAST:  75mL OMNIPAQUE IOHEXOL 350 MG/ML SOLN COMPARISON:  Same day chest radiograph FINDINGS: Cardiovascular: The study is high quality for the  evaluation of pulmonary embolism. There are no filling defects in the central, lobar, segmental or subsegmental pulmonary artery branches to suggest acute pulmonary embolism. Great vessels are normal in course and caliber. Normal heart size. No significant pericardial fluid/thickening. Coronary artery calcifications and aortic atherosclerosis. Mediastinum/Nodes: Imaged thyroid gland without nodules meeting criteria for imaging follow-up by size. Normal esophagus. No pathologically enlarged axillary, supraclavicular, mediastinal, or hilar lymph nodes. Lungs/Pleura: The central airways are patent. Trace secretions within the trachea extending into the bilateral bronchi. Mild  rightward deviation of the trachea. Moderate effacement of the upper lobe bronchus secondary to platelike consolidation and architectural distortion in the right upper lobe and right perihilar region with traction bronchiectasis and extending into the right lower lobe, likely secondary to postradiation changes. Bilateral lower lobe irregular scarring and scattered areas of subpleural reticulations, most notable in the right middle lobe. Additional areas of scarring and traction bronchiectasis along the medial left upper lobe. 6 x 3 mm subpleural left lower lobe nodule (6:77). No pneumothorax. No pleural effusion. Upper abdomen: Normal. Musculoskeletal: No acute or abnormal lytic or blastic osseous lesions. Review of the MIP images confirms the above findings. IMPRESSION: 1. No evidence of pulmonary embolism. 2. Postradiation changes in the right upper lobe and right perihilar region with moderate effacement of the upper lobe bronchus and mild rightward deviation of the trachea. 3. Bilateral lower lobe irregular scarring and scattered areas of subpleural reticulations, most notable in the right middle lobe, likely postinfectious/postinflammatory. 4. A 6 x 3 mm subpleural left lower lobe nodule. No specific follow-up imaging recommended. 5. Aortic  Atherosclerosis (ICD10-I70.0). Coronary artery calcifications. Assessment for potential risk factor modification, dietary therapy or pharmacologic therapy may be warranted, if clinically indicated. Electronically Signed   By: Agustin Cree M.D.   On: 07/20/2023 17:15   DG Chest 2 View  Result Date: 07/20/2023 CLINICAL DATA:  Chest pain, shortness of breath. EXAM: CHEST - 2 VIEW COMPARISON:  October 24, 2021. FINDINGS: Stable cardiomediastinal silhouette. Stable scarring is noted in right upper lobe. Mild bibasilar subsegmental atelectasis or scarring is noted. Bony thorax is unremarkable. IMPRESSION: Mild bibasilar subsegmental atelectasis or scarring. Electronically Signed   By: Lupita Raider M.D.   On: 07/20/2023 14:47    EKG: Independently reviewed.  Sinus rhythm, rate 73, QTc 445, no significant change from prior.  Assessment/Plan Principal Problem:   COPD with acute exacerbation (HCC) Active Problems:   Acute hypoxic respiratory failure (HCC)   Type 2 diabetes mellitus with hyperglycemia (HCC)   Essential hypertension, benign   Chest pain   Mood disorder (HCC)   History of rectal cancer  Assessment and Plan: * COPD with acute exacerbation (HCC) With hypoxia.  Wheezing/ rhonchi initially present in ED, improved on my evaluation.  COVID-negative.  CTA chest negative for PE, shows postinfectious/postinflammatory scarring.  Completed at least 7 days of doxycycline and 5 days of Augmentin as outpatient.  - With complaints of diarrhea, hold off on further antibiotics -Nebs -Solu-Medrol 125 mg given, resume prednisone 40 daily in a.m. -Monitor blood sugars while on steroids, diabetic  Acute hypoxic respiratory failure (HCC) O2 sats down to 86% on room air.  Rest on 2 L.  Likely secondary to COPD exacerbation.  CTA chest negative for PE, with postinfectious/postinflammatory scarring.  Mood disorder (HCC) Resume BuSpar  Essential hypertension, benign Stable. -Resume carvedilol  Type 2  diabetes mellitus with hyperglycemia (HCC) - Blood glucose 233. - Hgba1c - SSI- M -Hold Trulicity   DVT prophylaxis: Lovenox Code Status: FULL code-family patient and daughter Joni Reining at bedside. Family Communication: Daughter Joni Reining, and son-in-law at bedside. Disposition Plan:  ~ 2 DAYS Consults called:  none Admission status: Inpt Tele I certify that at the point of admission it is my clinical judgment that the patient will require inpatient hospital care spanning beyond 2 midnights from the point of admission due to high intensity of service, high risk for further deterioration and high frequency of surveillance required.   Author: Onnie Boer, MD 07/20/2023 8:16  PM  For on call review [email or web address].

## 2023-07-20 NOTE — ED Triage Notes (Signed)
Pt with mid CP since Friday with SOB, denies any N/V or diaphoresis. Pt with hx of COPD and PNA.  States her cough "has stopped".

## 2023-07-20 NOTE — Assessment & Plan Note (Signed)
With hypoxia.  Wheezing/ rhonchi initially present in ED, improved on my evaluation.  COVID-negative.  CTA chest negative for PE, shows postinfectious/postinflammatory scarring.  Completed at least 7 days of doxycycline and 5 days of Augmentin as outpatient.  - With complaints of diarrhea, hold off on further antibiotics -Nebs -Solu-Medrol 125 mg given, resume prednisone 40 daily in a.m. -Monitor blood sugars while on steroids, diabetic

## 2023-07-20 NOTE — Assessment & Plan Note (Signed)
-   Blood glucose 233. - Hgba1c - SSI- M -Hold Trulicity

## 2023-07-20 NOTE — Assessment & Plan Note (Signed)
-

## 2023-07-21 DIAGNOSIS — J441 Chronic obstructive pulmonary disease with (acute) exacerbation: Secondary | ICD-10-CM | POA: Diagnosis not present

## 2023-07-21 DIAGNOSIS — J9601 Acute respiratory failure with hypoxia: Secondary | ICD-10-CM | POA: Diagnosis not present

## 2023-07-21 LAB — GLUCOSE, CAPILLARY
Glucose-Capillary: 116 mg/dL — ABNORMAL HIGH (ref 70–99)
Glucose-Capillary: 194 mg/dL — ABNORMAL HIGH (ref 70–99)

## 2023-07-21 LAB — HEMOGLOBIN A1C
Hgb A1c MFr Bld: 6.7 % — ABNORMAL HIGH (ref 4.8–5.6)
Mean Plasma Glucose: 145.59 mg/dL

## 2023-07-21 MED ORDER — ARFORMOTEROL TARTRATE 15 MCG/2ML IN NEBU
15.0000 ug | INHALATION_SOLUTION | Freq: Two times a day (BID) | RESPIRATORY_TRACT | Status: DC
  Administered 2023-07-21: 15 ug via RESPIRATORY_TRACT
  Filled 2023-07-21: qty 2

## 2023-07-21 MED ORDER — BUDESONIDE 0.5 MG/2ML IN SUSP
0.5000 mg | Freq: Two times a day (BID) | RESPIRATORY_TRACT | Status: DC
  Administered 2023-07-21: 0.5 mg via RESPIRATORY_TRACT
  Filled 2023-07-21: qty 2

## 2023-07-21 MED ORDER — METHYLPREDNISOLONE SODIUM SUCC 125 MG IJ SOLR
60.0000 mg | Freq: Two times a day (BID) | INTRAMUSCULAR | Status: DC
  Administered 2023-07-21: 60 mg via INTRAVENOUS
  Filled 2023-07-21: qty 2

## 2023-07-21 MED ORDER — IPRATROPIUM-ALBUTEROL 0.5-2.5 (3) MG/3ML IN SOLN
3.0000 mL | Freq: Four times a day (QID) | RESPIRATORY_TRACT | Status: DC
  Administered 2023-07-21: 3 mL via RESPIRATORY_TRACT
  Filled 2023-07-21: qty 3

## 2023-07-21 MED ORDER — PREDNISONE 20 MG PO TABS: 60.0000 mg | ORAL_TABLET | Freq: Every day | ORAL | Status: DC

## 2023-07-21 MED ORDER — PREDNISONE 10 MG PO TABS: 60.0000 mg | ORAL_TABLET | Freq: Every day | ORAL | 0 refills | Status: DC

## 2023-07-21 NOTE — Progress Notes (Signed)
Mobility Specialist Progress Note:    07/21/23 0935  Mobility  Activity Ambulated with assistance in room  Level of Assistance Standby assist, set-up cues, supervision of patient - no hands on  Assistive Device Cane  Distance Ambulated (ft) 40 ft  Range of Motion/Exercises Active;All extremities  Activity Response Tolerated well  Mobility Referral Yes  Mobility visit 1 Mobility  Mobility Specialist Start Time (ACUTE ONLY) [id number]  Mobility Specialist Stop Time (ACUTE ONLY) 0935  Mobility Specialist Time Calculation (min) (ACUTE ONLY) 10 min   Pt received in bed, agreeable to mobility. Required supervision to stand and ambulate with cane. Tolerated well, SpO2 93% on RA during session. Returned supine, alarm on. All needs met.   Kimberly Graves Mobility Specialist Please contact via Special educational needs teacher or  Rehab office at [phone]

## 2023-07-21 NOTE — Care Management Obs Status (Signed)
MEDICARE OBSERVATION STATUS NOTIFICATION   Patient Details  Name: Sajada Brodigan MRN: [id number] Date of Birth: 1950-12-08   Medicare Observation Status Notification Given:  Yes    Karn Cassis, LCSW 07/21/2023, 1:20 PM

## 2023-07-21 NOTE — Plan of Care (Signed)
  Problem: Education: Goal: Knowledge of General Education information will improve Description: Including pain rating scale, medication(s)/side effects and non-pharmacologic comfort measures Outcome: Progressing   Problem: Health Behavior/Discharge Planning: Goal: Ability to manage health-related needs will improve Outcome: Progressing   Problem: Clinical Measurements: Goal: Ability to maintain clinical measurements within normal limits will improve Outcome: Progressing Goal: Will remain free from infection Outcome: Progressing Goal: Diagnostic test results will improve Outcome: Progressing Goal: Respiratory complications will improve Outcome: Progressing Goal: Cardiovascular complication will be avoided Outcome: Progressing   Problem: Activity: Goal: Risk for activity intolerance will decrease Outcome: Progressing   Problem: Nutrition: Goal: Adequate nutrition will be maintained Outcome: Progressing   Problem: Coping: Goal: Level of anxiety will decrease Outcome: Progressing   Problem: Elimination: Goal: Will not experience complications related to bowel motility Outcome: Progressing Goal: Will not experience complications related to urinary retention Outcome: Progressing   Problem: Pain Management: Goal: General experience of comfort will improve Outcome: Progressing   Problem: Safety: Goal: Ability to remain free from injury will improve Outcome: Progressing   Problem: Skin Integrity: Goal: Risk for impaired skin integrity will decrease Outcome: Progressing   Problem: Education: Goal: Knowledge of disease or condition will improve Outcome: Progressing Goal: Knowledge of the prescribed therapeutic regimen will improve Outcome: Progressing Goal: Individualized Educational Video(s) Outcome: Progressing   Problem: Activity: Goal: Ability to tolerate increased activity will improve Outcome: Progressing Goal: Will verbalize the importance of balancing  activity with adequate rest periods Outcome: Progressing   Problem: Respiratory: Goal: Ability to maintain a clear airway will improve Outcome: Progressing Goal: Levels of oxygenation will improve Outcome: Progressing Goal: Ability to maintain adequate ventilation will improve Outcome: Progressing   Problem: Education: Goal: Ability to describe self-care measures that may prevent or decrease complications (Diabetes Survival Skills Education) will improve Outcome: Progressing Goal: Individualized Educational Video(s) Outcome: Progressing   Problem: Coping: Goal: Ability to adjust to condition or change in health will improve Outcome: Progressing   Problem: Fluid Volume: Goal: Ability to maintain a balanced intake and output will improve Outcome: Progressing   Problem: Health Behavior/Discharge Planning: Goal: Ability to identify and utilize available resources and services will improve Outcome: Progressing Goal: Ability to manage health-related needs will improve Outcome: Progressing   Problem: Metabolic: Goal: Ability to maintain appropriate glucose levels will improve Outcome: Progressing   Problem: Nutritional: Goal: Maintenance of adequate nutrition will improve Outcome: Progressing Goal: Progress toward achieving an optimal weight will improve Outcome: Progressing   Problem: Skin Integrity: Goal: Risk for impaired skin integrity will decrease Outcome: Progressing   Problem: Tissue Perfusion: Goal: Adequacy of tissue perfusion will improve Outcome: Progressing

## 2023-07-21 NOTE — Plan of Care (Signed)

## 2023-07-21 NOTE — Care Management CC44 (Signed)
Condition Code 44 Documentation Completed  Patient Details  Name: Kimberly Graves MRN: [id number] Date of Birth: [birth year]   Condition Code 44 given:  Yes Patient signature on Condition Code 44 notice:  Yes Documentation of 2 MD's agreement:  Yes Code 44 added to claim:  Yes    Karn Cassis, LCSW 07/21/2023, 1:20 PM

## 2023-07-21 NOTE — Progress Notes (Signed)
SATURATION QUALIFICATIONS:  Patient Saturations on Room Air at Rest = 96%  Patient Saturations on Room Air while Ambulating = 91-92%  Pt walked approx 40 feet with cane. No oxygen needed at this time. Notified. Dr. Arbutus Leas.

## 2023-07-21 NOTE — Hospital Course (Signed)
72 y.o. female with medical history significant for hypertension, rectal cancer, diabetes mellitus, COPD. Patient presented to the ED with complaints of difficulty breathing for about 2 weeks duration, she initially had a cough also but that has improved.  She went to her outpatient provider, was started on prednisone doxycycline- 12/4, and subsequently went to an urgent care- 12/6 and Augmentin was added.  She took both medications.  She reports some mid lower chest pain nonradiating.  She states her CP has been chronic lasting "few years".  She has been evaluated by cardiology previously and it was felt to be noncardiac  No lower extremity swelling or orthopnea..  She is on home O2 but does not need it. When she started taking the antibiotics, she reports having severe diarrhea for which she was taking loperamide up to 4 more tablets daily.  Last bowel movement was today and was normal consistency but she took loperamide .  No abdominal pain.  No vomiting.  No fevers no chills.   ED Course: O2 sats down to 86% on room air, placed on 2 L, Tmax 99.1.  Heart rate 60s to 70s.  Blood pressure systolic 137-155. BNP 64.  COVID-negative.  Chest x-ray-atelectasis or scarring.  CTA chest-no PE, shows postradiation changes, bilateral lower lobe irregular scarring likely postinfectious/postinflammatory. Wheezing and rhonchi appreciated in ED, DuoNebs, doxycycline, given.  Hospitalist to admit for acute hypoxic respiratory failure secondary to COPD exacerbation.  Patient was started on solumedrol and duonebs and pulmicort.  She improved clinically and she was weaned off oxygen.  She was ambulated on RA with saturation nadir o 91%.  She will d/c home with prednisone taper.  She has pulmonary appointment on 07/25/23.

## 2023-07-21 NOTE — Discharge Summary (Signed)
Physician Discharge Summary   Patient: Kimberly Graves MRN: [id number] DOB: Jun 27, 1951  Admit date:     07/20/2023  Discharge date: 07/21/23  Discharge Physician: Onalee Hua Harish Bram   PCP: [name], MD   Recommendations at discharge:   Please follow up with primary care provider within 1-2 weeks  Please repeat BMP and CBC in one week    Hospital Course:  72 y.o. female with medical history significant for hypertension, rectal cancer, diabetes mellitus, COPD. Patient presented to the ED with complaints of difficulty breathing for about 2 weeks duration, she initially had a cough also but that has improved.  She went to her outpatient provider, was started on prednisone doxycycline- 12/4, and subsequently went to an urgent care- 12/6 and Augmentin was added.  She took both medications.  She reports some mid lower chest pain nonradiating.  She states her CP has been chronic lasting "few years".  She has been evaluated by cardiology previously and it was felt to be noncardiac  No lower extremity swelling or orthopnea..  She is on home O2 but does not need it. When she started taking the antibiotics, she reports having severe diarrhea for which she was taking loperamide up to 4 more tablets daily.  Last bowel movement was today and was normal consistency but she took loperamide .  No abdominal pain.  No vomiting.  No fevers no chills.   ED Course: O2 sats down to 86% on room air, placed on 2 L, Tmax 99.1.  Heart rate 60s to 70s.  Blood pressure systolic 137-155. BNP 64.  COVID-negative.  Chest x-ray-atelectasis or scarring.  CTA chest-no PE, shows postradiation changes, bilateral lower lobe irregular scarring likely postinfectious/postinflammatory. Wheezing and rhonchi appreciated in ED, DuoNebs, doxycycline, given.  Hospitalist to admit for acute hypoxic respiratory failure secondary to COPD exacerbation.  Patient was started on solumedrol and duonebs and pulmicort.  She improved clinically and she  was weaned off oxygen.  She was ambulated on RA with saturation nadir o 91%.  She will d/c home with prednisone taper.  She has pulmonary appointment on 07/25/23.  Assessment and Plan: COPD with acute exacerbation (HCC) With hypoxia.  Wheezing/ rhonchi initially present in ED, improved on my evaluation.  COVID-negative.  CTA chest negative for PE, shows postinfectious/postinflammatory scarring.  Completed at least 7 days of doxycycline and 5 days of Augmentin as outpatient.  - With complaints of diarrhea, hold off on further antibiotics -continue duonebs, pulmicort -Solu-Medrol IV initiated -Monitor blood sugars while on steroids, diabetic   Acute hypoxic respiratory failure (HCC) O2 sats down to 86% on room air.  Rest on 2 L.  Likely secondary to COPD exacerbation.  CTA chest negative for PE, with postinfectious/postinflammatory scarring. -weaned off oxygen -stable on RA -ambulated on RA with nadir saturation 91%   Mood disorder (HCC) Resume BuSpar   Essential hypertension, benign Stable. -Resume carvedilol   Type 2 diabetes mellitus with hyperglycemia (HCC) - Blood glucose elevated in part due to steroids - 07/21/23 Hgba1c--6.7 - SSI- M - resume Trulicity after d/c  Mixed hyperlipidemia -continue statin       Consultants: none Procedures performed: none  Disposition: Home Diet recommendation:  Carb modified diet DISCHARGE MEDICATION: Allergies as of 07/21/2023       Reactions   Cefdinir Other (See Comments)   Codeine Nausea And Vomiting   Lorcet [hydrocodone-acetaminophen] Nausea And Vomiting   Percocet [oxycodone-acetaminophen] Nausea And Vomiting        Medication List  STOP taking these medications    amoxicillin-clavulanate 875-125 MG tablet Commonly known as: AUGMENTIN   predniSONE 10 MG (21) Tbpk tablet Commonly known as: STERAPRED UNI-PAK 21 TAB Replaced by: predniSONE 10 MG tablet       TAKE these medications    Accu-Chek Guide Me  w/Device Kit 1 Piece by Does not apply route as directed.   Accu-Chek Guide test strip Generic drug: glucose blood Use as instructed   acetaminophen 500 MG tablet Commonly known as: TYLENOL Take 1,000 mg by mouth daily.   albuterol 108 (90 Base) MCG/ACT inhaler Commonly known as: VENTOLIN HFA Inhale 2 puffs into the lungs every 6 (six) hours as needed for shortness of breath.   albuterol (2.5 MG/3ML) 0.083% nebulizer solution Commonly known as: PROVENTIL Take 2.5 mg by nebulization every 6 (six) hours as needed for shortness of breath or wheezing.   aspirin 81 MG chewable tablet Chew 81 mg by mouth at bedtime.   atorvastatin 80 MG tablet Commonly known as: LIPITOR Take 1 tablet by mouth daily.   B-D ULTRAFINE III SHORT PEN 31G X 8 MM Misc Generic drug: Insulin Pen Needle 1 each by Does not apply route as directed.   busPIRone 10 MG tablet Commonly known as: BUSPAR Take 10 mg by mouth 2 (two) times daily.   carvedilol 3.125 MG tablet Commonly known as: COREG Take 3.125 mg by mouth 2 (two) times daily with a meal.   citalopram 20 MG tablet Commonly known as: CELEXA Take 20 mg by mouth daily.   Cranberry 200 MG Caps Take 200 mg by mouth at bedtime.   fenofibrate 145 MG tablet Commonly known as: TRICOR Take 290 mg by mouth daily.   ibuprofen 200 MG tablet Commonly known as: ADVIL Take 400 mg by mouth at bedtime.   levETIRAcetam 500 MG tablet Commonly known as: KEPPRA Take 500 mg by mouth 2 (two) times daily.   loperamide 2 MG tablet Commonly known as: IMODIUM A-D Take 2 mg by mouth 4 (four) times daily as needed for diarrhea or loose stools.   loratadine 10 MG tablet Commonly known as: CLARITIN Take 10 mg by mouth daily.   Mucinex 600 MG 12 hr tablet Generic drug: guaiFENesin Take 600 mg by mouth 2 (two) times daily as needed for cough or to loosen phlegm.   multivitamin capsule Take 1 capsule by mouth daily.   omeprazole 40 MG capsule Commonly  known as: PRILOSEC Take 40 mg by mouth daily before breakfast.   predniSONE 10 MG tablet Commonly known as: DELTASONE Take 6 tablets (60 mg total) by mouth daily with breakfast. And decrease by one tablet daily Start taking on: July 22, 2023 Replaces: predniSONE 10 MG (21) Tbpk tablet   pregabalin 75 MG capsule Commonly known as: LYRICA Take 75 mg by mouth 2 (two) times daily.   Trelegy Ellipta 200-62.5-25 MCG/ACT Aepb Generic drug: Fluticasone-Umeclidin-Vilant Inhale 1 puff into the lungs daily.   trolamine salicylate 10 % cream Commonly known as: ASPERCREME Apply 1 Application topically as needed for muscle pain.   Trulicity 4.5 MG/0.5ML Soaj Generic drug: Dulaglutide Inject 4.5 mg into the skin once a week.   Vascepa 1 g capsule Generic drug: icosapent Ethyl TAKE ONE CAPSULE BY MOUTH TWICE DAILY WITH FOOD        Discharge Exam: Filed Weights   07/20/23 1326  Weight: 75.3 kg   HEENT:  [place]/AT, No thrush, no icterus CV:  RRR, no rub, no S3, no S4 Lung:  fine bibasilar rales.  No wheeze Abd:  soft/+BS, NT Ext:  No edema, no lymphangitis, no synovitis, no rash   Condition at discharge: stable  The results of significant diagnostics from this hospitalization (including imaging, microbiology, ancillary and laboratory) are listed below for reference.   Imaging Studies: CT Angio Chest PE W/Cm &/Or Wo Cm Result Date: 07/20/2023 CLINICAL DATA:  Five day history of mid chest pain associated with shortness of breath. 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 EXAM: CT ANGIOGRAPHY CHEST WITH CONTRAST TECHNIQUE: Multidetector CT imaging of the chest was performed using the standard protocol during bolus administration of intravenous contrast. Multiplanar CT image reconstructions and MIPs were obtained to evaluate the vascular anatomy. RADIATION DOSE REDUCTION: This exam was performed according to the departmental dose-optimization program  which includes automated exposure control, adjustment of the mA and/or kV according to patient size and/or use of iterative reconstruction technique. CONTRAST:  75mL OMNIPAQUE IOHEXOL 350 MG/ML SOLN COMPARISON:  Same day chest radiograph FINDINGS: Cardiovascular: The study is high quality for the evaluation of pulmonary embolism. There are no filling defects in the central, lobar, segmental or subsegmental pulmonary artery branches to suggest acute pulmonary embolism. Great vessels are normal in course and caliber. Normal heart size. No significant pericardial fluid/thickening. Coronary artery calcifications and aortic atherosclerosis. Mediastinum/Nodes: Imaged thyroid gland without nodules meeting criteria for imaging follow-up by size. Normal esophagus. No pathologically enlarged axillary, supraclavicular, mediastinal, or hilar lymph nodes. Lungs/Pleura: The central airways are patent. Trace secretions within the trachea extending into the bilateral bronchi. Mild rightward deviation of the trachea. Moderate effacement of the upper lobe bronchus secondary to platelike consolidation and architectural distortion in the right upper lobe and right perihilar region with traction bronchiectasis and extending into the right lower lobe, likely secondary to postradiation changes. Bilateral lower lobe irregular scarring and scattered areas of subpleural reticulations, most notable in the right middle lobe. Additional areas of scarring and traction bronchiectasis along the medial left upper lobe. 6 x 3 mm subpleural left lower lobe nodule (6:77). No pneumothorax. No pleural effusion. Upper abdomen: Normal. Musculoskeletal: No acute or abnormal lytic or blastic osseous lesions. Review of the MIP images confirms the above findings. IMPRESSION: 1. No evidence of pulmonary embolism. 2. Postradiation changes in the right upper lobe and right perihilar region with moderate effacement of the upper lobe bronchus and mild rightward  deviation of the trachea. 3. Bilateral lower lobe irregular scarring and scattered areas of subpleural reticulations, most notable in the right middle lobe, likely postinfectious/postinflammatory. 4. A 6 x 3 mm subpleural left lower lobe nodule. No specific follow-up imaging recommended. 5. Aortic Atherosclerosis (ICD10-I70.0). Coronary artery calcifications. Assessment for potential risk factor modification, dietary therapy or pharmacologic therapy may be warranted, if clinically indicated. Electronically Signed   By: Agustin Cree M.D.   On: 07/20/2023 17:15   DG Chest 2 View Result Date: 07/20/2023 CLINICAL DATA:  Chest pain, shortness of breath. EXAM: CHEST - 2 VIEW COMPARISON:  October 24, 2021. FINDINGS: Stable cardiomediastinal silhouette. Stable scarring is noted in right upper lobe. Mild bibasilar subsegmental atelectasis or scarring is noted. Bony thorax is unremarkable. IMPRESSION: Mild bibasilar subsegmental atelectasis or scarring. Electronically Signed   By: Lupita Raider M.D.   On: 07/20/2023 14:47    Microbiology: Results for orders placed or performed during the hospital encounter of 07/20/23  Resp panel by RT-PCR (RSV, Flu A&B, Covid) Anterior Nasal Swab     Status: None  Collection Time: 07/20/23  5:22 PM   Specimen: Anterior Nasal Swab  Result Value Ref Range Status   SARS Coronavirus 2 by RT PCR NEGATIVE NEGATIVE Final    Comment: (NOTE) SARS-CoV-2 target nucleic acids are NOT DETECTED.  The SARS-CoV-2 RNA is generally detectable in upper respiratory specimens during the acute phase of infection. The lowest concentration of SARS-CoV-2 viral copies this assay can detect is 138 copies/mL. A negative result does not preclude SARS-Cov-2 infection and should not be used as the sole basis for treatment or other patient management decisions. A negative result may occur with  improper specimen collection/handling, submission of specimen other than nasopharyngeal swab, presence of  viral mutation(s) within the areas targeted by this assay, and inadequate number of viral copies(<138 copies/mL). A negative result must be combined with clinical observations, patient history, and epidemiological information. The expected result is Negative.  Fact Sheet for Patients:  BloggerCourse.com  Fact Sheet for Healthcare Providers:  SeriousBroker.it  This test is no t yet approved or cleared by the [place] FDA and  has been authorized for detection and/or diagnosis of SARS-CoV-2 by FDA under an Emergency Use Authorization (EUA). This EUA will remain  in effect (meaning this test can be used) for the duration of the COVID-19 declaration under Section 564(b)(1) of the Act, 21 U.S.C.section 360bbb-3(b)(1), unless the authorization is terminated  or revoked sooner.       Influenza A by PCR NEGATIVE NEGATIVE Final   Influenza B by PCR NEGATIVE NEGATIVE Final    Comment: (NOTE) The Xpert Xpress SARS-CoV-2/FLU/RSV plus assay is intended as an aid in the diagnosis of influenza from Nasopharyngeal swab specimens and should not be used as a sole basis for treatment. Nasal washings and aspirates are unacceptable for Xpert Xpress SARS-CoV-2/FLU/RSV testing.  Fact Sheet for Patients: BloggerCourse.com  Fact Sheet for Healthcare Providers: SeriousBroker.it  This test is not yet approved or cleared by the [place] FDA and has been authorized for detection and/or diagnosis of SARS-CoV-2 by FDA under an Emergency Use Authorization (EUA). This EUA will remain in effect (meaning this test can be used) for the duration of the COVID-19 declaration under Section 564(b)(1) of the Act, 21 U.S.C. section 360bbb-3(b)(1), unless the authorization is terminated or revoked.     Resp Syncytial Virus by PCR NEGATIVE NEGATIVE Final    Comment: (NOTE) Fact Sheet for  Patients: BloggerCourse.com  Fact Sheet for Healthcare Providers: SeriousBroker.it  This test is not yet approved or cleared by the [place] FDA and has been authorized for detection and/or diagnosis of SARS-CoV-2 by FDA under an Emergency Use Authorization (EUA). This EUA will remain in effect (meaning this test can be used) for the duration of the COVID-19 declaration under Section 564(b)(1) of the Act, 21 U.S.C. section 360bbb-3(b)(1), unless the authorization is terminated or revoked.  Performed at [facility], [address] [place], [place] [address]     Labs: CBC: Recent Labs  Lab 07/20/23 1348  WBC 9.1  HGB 15.3*  HCT 47.4*  MCV 90.6  PLT 343   Basic Metabolic Panel: Recent Labs  Lab 07/20/23 1348  NA 136  K 4.0  CL 103  CO2 21*  GLUCOSE 233*  BUN 28*  CREATININE 0.77  CALCIUM 9.8   Liver Function Tests: No results for input(s): "AST", "ALT", "ALKPHOS", "BILITOT", "PROT", "ALBUMIN" in the last 168 hours. CBG: Recent Labs  Lab 07/20/23 2037 07/21/23 0753 07/21/23 1127  GLUCAP 264* 116* 194*    Discharge  time spent: greater than 30 minutes.  Signed: Catarina Hartshorn, MD [facility] 07/21/2023

## 2023-07-21 NOTE — Progress Notes (Signed)
Transition of Care Department [facility]) has reviewed patient and no other TOC needs have been identified at this time. We will continue to monitor patient advancement through interdisciplinary progression rounds. If new patient transition needs arise, please place a TOC consult.   07/21/23 0739  TOC Brief Assessment  Insurance and Status Reviewed  Patient has primary care physician Yes  Home environment has been reviewed Lives with daughter and son-in-law.  Prior level of function: Independent.  Prior/Current Home Services No current home services  Social Drivers of Health Review SDOH reviewed no interventions necessary  Readmission risk has been reviewed Yes  Transition of care needs no transition of care needs at this time

## 2023-09-03 ENCOUNTER — Other Ambulatory Visit: Payer: Self-pay | Admitting: "Endocrinology

## 2023-09-03 DIAGNOSIS — E1165 Type 2 diabetes mellitus with hyperglycemia: Secondary | ICD-10-CM

## 2023-10-12 ENCOUNTER — Encounter: Payer: Self-pay | Admitting: "Endocrinology

## 2023-10-12 ENCOUNTER — Ambulatory Visit (INDEPENDENT_AMBULATORY_CARE_PROVIDER_SITE_OTHER): Payer: Medicare Other | Admitting: "Endocrinology

## 2023-10-12 VITALS — BP 144/84 | HR 84 | Ht 60.0 in | Wt 161.0 lb

## 2023-10-12 DIAGNOSIS — Z6832 Body mass index (BMI) 32.0-32.9, adult: Secondary | ICD-10-CM

## 2023-10-12 DIAGNOSIS — E6609 Other obesity due to excess calories: Secondary | ICD-10-CM

## 2023-10-12 DIAGNOSIS — E782 Mixed hyperlipidemia: Secondary | ICD-10-CM | POA: Diagnosis not present

## 2023-10-12 DIAGNOSIS — Z7985 Long-term (current) use of injectable non-insulin antidiabetic drugs: Secondary | ICD-10-CM | POA: Diagnosis not present

## 2023-10-12 DIAGNOSIS — I10 Essential (primary) hypertension: Secondary | ICD-10-CM

## 2023-10-12 DIAGNOSIS — E1165 Type 2 diabetes mellitus with hyperglycemia: Secondary | ICD-10-CM | POA: Diagnosis not present

## 2023-10-12 DIAGNOSIS — E66811 Obesity, class 1: Secondary | ICD-10-CM

## 2023-10-12 NOTE — Patient Instructions (Signed)

## 2023-10-12 NOTE — Progress Notes (Signed)
 10/12/2023, 10:00 AM  Endocrinology follow-up note   Subjective:    Patient ID: Kimberly Graves, female    DOB: [birth year].  Kimberly Graves is being seen in follow-up after she was seen in consultation for management of currently uncontrolled symptomatic diabetes requested by  [name], MD.   Past Medical History:  Diagnosis Date   COPD (chronic obstructive pulmonary disease) (HCC)    Diabetes mellitus, type II (HCC)    Hypertension    Myocardial infarction (HCC) 2020   Neuromuscular disorder (HCC)    Rectal cancer (HCC)    Small cell carcinoma (HCC) 2007    Past Surgical History:  Procedure Laterality Date   BIOPSY  12/16/2022   Procedure: BIOPSY;  Surgeon: [name], MD;  Location: [facility];  Service: Gastroenterology;;   BREAST BIOPSY Right 08/04/2022   MM RT BREAST BX W LOC DEV 1ST LESION IMAGE BX SPEC STEREO GUIDE 08/04/2022 [facility]   COLONOSCOPY WITH PROPOFOL N/A 12/16/2022   Procedure: COLONOSCOPY WITH PROPOFOL;  Surgeon: [name], MD;  Location: [facility];  Service: Gastroenterology;  Laterality: N/A;  10:15AM;ASA 3   POLYPECTOMY  12/16/2022   Procedure: POLYPECTOMY;  Surgeon: [name], MD;  Location: [facility];  Service: Gastroenterology;;   TUMOR REMOVAL  2019    Social History   Socioeconomic History   Marital status: Widowed    Spouse name: Not on file   Number of children: Not on file   Years of education: Not on file   Highest education level: Not on file  Occupational History   Not on file  Tobacco Use   Smoking status: Former    Current packs/day: 0.00    Types: Cigarettes    Quit date: 2007    Years since quitting: 18.1    Passive exposure: Past   Smokeless tobacco: Not on file  Vaping Use   Vaping status: Never Used  Substance and Sexual Activity   Alcohol use: Not Currently   Drug use: Never    Sexual activity: Not on file  Other Topics Concern   Not on file  Social History Narrative   Not on file   Social Drivers of Health   Financial Resource Strain: Low Risk  (06/21/2020)   Received from [facility] [address]   Overall Financial Resource Strain (CARDIA)    Difficulty of Paying Living Expenses: Not very hard  Food Insecurity: No Food Insecurity (07/20/2023)   Hunger Vital Sign    Worried About Running Out of Food in the Last Year: Never true    Ran Out of Food in the Last Year: Never true  Transportation Needs: No Transportation Needs (07/20/2023)   PRAPARE - Administrator, Civil Service (Medical): No    Lack of Transportation (Non-Medical): No  Physical Activity: Not on file  Stress: Not on file  Social Connections: Not on file    Family History  Problem Relation Age of Onset   Hypertension Mother    Cancer Mother    Hypertension Father     Outpatient Encounter Medications as of 10/12/2023  Medication Sig  acetaminophen (TYLENOL) 500 MG tablet Take 1,000 mg by mouth daily.   albuterol (PROVENTIL) (2.5 MG/3ML) 0.083% nebulizer solution Take 2.5 mg by nebulization every 6 (six) hours as needed for shortness of breath or wheezing.   albuterol (VENTOLIN HFA) 108 (90 Base) MCG/ACT inhaler Inhale 2 puffs into the lungs every 6 (six) hours as needed for shortness of breath.   aspirin 81 MG chewable tablet Chew 81 mg by mouth at bedtime.   atorvastatin (LIPITOR) 80 MG tablet Take 1 tablet by mouth daily.   Blood Glucose Monitoring Suppl (ACCU-CHEK GUIDE ME) w/Device KIT 1 Piece by Does not apply route as directed.   busPIRone (BUSPAR) 10 MG tablet Take 10 mg by mouth 2 (two) times daily.   carvedilol (COREG) 3.125 MG tablet Take 3.125 mg by mouth 2 (two) times daily with a meal.   citalopram (CELEXA) 20 MG tablet Take 20 mg by mouth daily.   Cranberry 200 MG CAPS Take 200 mg by mouth at bedtime.   Dulaglutide (TRULICITY) 4.5 MG/0.5ML SOAJ INJECT  4.5MG  SUBCUTANEOUSLY WEEKLY   fenofibrate (TRICOR) 145 MG tablet Take 290 mg by mouth daily.   glucose blood (ACCU-CHEK GUIDE) test strip Use as instructed   ibuprofen (ADVIL) 200 MG tablet Take 400 mg by mouth at bedtime.   Insulin Pen Needle (B-D ULTRAFINE III SHORT PEN) 31G X 8 MM MISC 1 each by Does not apply route as directed.   levETIRAcetam (KEPPRA) 500 MG tablet Take 500 mg by mouth 2 (two) times daily.   loperamide (IMODIUM A-D) 2 MG tablet Take 2 mg by mouth 4 (four) times daily as needed for diarrhea or loose stools.   loratadine (CLARITIN) 10 MG tablet Take 10 mg by mouth daily.   MUCINEX 600 MG 12 hr tablet Take 600 mg by mouth 2 (two) times daily as needed for cough or to loosen phlegm.   Multiple Vitamin (MULTIVITAMIN) capsule Take 1 capsule by mouth daily.   omeprazole (PRILOSEC) 40 MG capsule Take 40 mg by mouth daily before breakfast.   predniSONE (DELTASONE) 10 MG tablet Take 6 tablets (60 mg total) by mouth daily with breakfast. And decrease by one tablet daily   pregabalin (LYRICA) 75 MG capsule Take 75 mg by mouth 2 (two) times daily.   TRELEGY ELLIPTA 200-62.5-25 MCG/ACT AEPB Inhale 1 puff into the lungs daily.   trolamine salicylate (ASPERCREME) 10 % cream Apply 1 Application topically as needed for muscle pain.   VASCEPA 1 g capsule TAKE ONE CAPSULE BY MOUTH TWICE DAILY WITH FOOD   No facility-administered encounter medications on file as of 10/12/2023.    ALLERGIES: Allergies  Allergen Reactions   Cefdinir Other (See Comments)   Codeine Nausea And Vomiting   Lorcet [Hydrocodone-Acetaminophen] Nausea And Vomiting   Percocet [Oxycodone-Acetaminophen] Nausea And Vomiting    VACCINATION STATUS:  There is no immunization history on file for this patient.  Diabetes She presents for her follow-up diabetic visit. She has type 2 diabetes mellitus. Onset time: She was diagnosed at approximate age of 73 years. Her disease course has been improving. There are no  hypoglycemic associated symptoms. Pertinent negatives for diabetes include no polydipsia and no polyuria. There are no hypoglycemic complications. Symptoms are improving. There are no diabetic complications. Risk factors for coronary artery disease include diabetes mellitus, dyslipidemia, sedentary lifestyle, post-menopausal and tobacco exposure. Current diabetic treatments: She is currently on metformin 500 mg p.o. twice daily, recently added Trulicity at 1.5 mg weekly. Her weight is decreasing steadily.  She is following a generally unhealthy diet. When asked about meal planning, she reported none. She has had a previous visit with a dietitian. She rarely participates in exercise. Her home blood glucose trend is decreasing steadily. Her breakfast blood glucose range is generally 130-140 mg/dl. Her bedtime blood glucose range is generally 140-180 mg/dl. Her overall blood glucose range is 140-180 mg/dl. (Patient is returning with significantly improving glycemic profile and previsit A1c of 6.7% improving from 9.3%.  She is tolerating Trulicity.  She did not fill the Jardiance prescription discussed during her last visit.    ) An ACE inhibitor/angiotensin II receptor blocker is not being taken. She does not see a podiatrist.Eye exam is not current.  Hyperlipidemia This is a chronic problem. Exacerbating diseases include diabetes. Current antihyperlipidemic treatment includes fibric acid derivatives and statins. Risk factors for coronary artery disease include diabetes mellitus, dyslipidemia, hypertension, post-menopausal and a sedentary lifestyle.  Hypertension This is a chronic problem. The current episode started more than 1 year ago. Risk factors for coronary artery disease include diabetes mellitus and dyslipidemia. Past treatments include beta blockers.     Review of Systems  Endocrine: Negative for polydipsia and polyuria.    Objective:       10/12/2023    9:08 AM 07/21/2023    4:09 AM  07/20/2023   11:59 PM  Vitals with BMI  Height 5\' 0"     Weight 161 lbs    BMI 31.44    Systolic 144 143 161  Diastolic 84 79 83  Pulse 84 70 71    BP (!) 144/84   Pulse 84   Ht 5' (1.524 m)   Wt 161 lb (73 kg)   BMI 31.44 kg/m   Wt Readings from Last 3 Encounters:  10/12/23 161 lb (73 kg)  07/20/23 166 lb (75.3 kg)  05/12/23 160 lb 15 oz (73 kg)     Physical Exam    CMP ( most recent) CMP     Component Value Date/Time   NA 136 07/20/2023 1348   NA 141 01/11/2023 1617   K 4.0 07/20/2023 1348   CL 103 07/20/2023 1348   CO2 21 (L) 07/20/2023 1348   GLUCOSE 233 (H) 07/20/2023 1348   BUN 28 (H) 07/20/2023 1348   BUN 12 01/11/2023 1617   CREATININE 0.77 07/20/2023 1348   CREATININE 0.78 08/26/2022 1130   CALCIUM 9.8 07/20/2023 1348   PROT 6.4 01/11/2023 1622   ALBUMIN 4.3 01/11/2023 1622   AST 73 (H) 01/11/2023 1622   ALT 42 (H) 01/11/2023 1622   ALKPHOS 59 01/11/2023 1622   BILITOT 0.3 01/11/2023 1622   GFRNONAA >60 07/20/2023 1348     Diabetic Labs (most recent): Lab Results  Component Value Date   HGBA1C 6.7 (H) 07/21/2023   HGBA1C 7.7 (A) 08/12/2022   HGBA1C 8.1 (A) 03/31/2022   MICROALBUR 8.3 07/23/2020    Lipid Panel     Component Value Date/Time   CHOL 214 (H) 01/11/2023 1617   TRIG 583 (HH) 01/11/2023 1617   HDL 28 (L) 01/11/2023 1617   CHOLHDL 7.6 (H) 01/11/2023 1617   LDLCALC 90 01/11/2023 1617   LABVLDL 96 (H) 01/11/2023 1617      Assessment & Plan:   1. Type 2 diabetes mellitus with hyperglycemia, unspecified whether long term insulin use (HCC) - Annabeth Tortora has currently uncontrolled symptomatic type 2 DM since  73 years of age.  Patient is returning with significantly improving glycemic profile and previsit A1c of  6.7% improving from 9.3%.  She is tolerating Trulicity.  She did not fill the Jardiance prescription discussed during her last visit.    - Recent labs reviewed. - I had a long discussion with her about the  progressive nature of diabetes and the pathology behind its complications. -She does not report gross complications from her diabetes, however she remains at a high risk for more acute and chronic complications which include CAD, CVA, CKD, retinopathy, and neuropathy. These are all discussed in detail with her.  - I have counseled her on diet  and weight management  by adopting a carbohydrate restricted/protein rich diet. Patient is encouraged to switch to  unprocessed or minimally processed     complex starch and increased protein intake (animal or plant source), fruits, and vegetables. -  she is advised to stick to a routine mealtimes to eat 3 meals  a day and avoid unnecessary snacks ( to snack only to correct hypoglycemia).   - she acknowledges that there is a room for improvement in her food and drink choices. - Suggestion is made for her to avoid simple carbohydrates  from her diet including Cakes, Sweet Desserts, Ice Cream, Soda (diet and regular), Sweet Tea, Candies, Chips, Cookies, Store Bought Juices, Alcohol , Artificial Sweeteners,  Coffee Creamer, and "Sugar-free" Products, Lemonade. This will help patient to have more stable blood glucose profile and potentially avoid unintended weight gain.  - she will be scheduled with Norm Salt, RDN, CDE for diabetes education.  - I have approached her with the following individualized plan to manage  her diabetes and patient agrees:    -Based on her presentation with A1c of 6.7%, she will not need insulin treatment for now.  She is advised to continue Trulicity 4.5 mg subcutaneously weekly.  Side effects and precautions discussed with her.    -She is willing and advised to continue monitoring blood glucose twice a day-daily before breakfast and at bedtime. - she is encouraged to call clinic for blood glucose levels less than 70 or above 200 mg /dl. -She has not filled the Jardiance prescription given to her last visit.  If she loses control,  Jardiance would be a reasonable medication to add.     - Specific targets for  A1c;  LDL, HDL,  and Triglycerides were discussed with the patient.  2) Blood Pressure /Hypertension:  Her blood pressure is not controlled to target.  she is advised to continue her current blood pressure medication including carvedilol 3.25 mg p.o. twice daily.     3) Lipids/Hyperlipidemia:   Review of her recent lipid panel showed un controlled hypertriglyceridemia at 583.    She is currently on fenofibrate 145 mg p.o. daily, as well as atorvastatin 80 mg p.o. nightly.  She is advised to continue on both.    Whole food plant-based diet will help with dyslipidemia as well as hypertension.   4)  Weight/Diet:  Body mass index is 31.44 kg/m.  -She presents with 10 pounds of weight loss since last visit.    she is  a candidate for some more  weight loss, recently experienced unintended weight loss.  I discussed with her the fact that loss of 5 - 10% of her  current body weight will have the most impact on her diabetes management.  Exercise, and detailed carbohydrates information provided  -  detailed on discharge instructions.  5) Chronic Care/Health Maintenance:  -she  is on  Statin medications and  is encouraged to initiate  and continue to follow up with Ophthalmology, Dentist,  Podiatrist at least yearly or according to recommendations, and advised to   stay away from smoking. I have recommended yearly flu vaccine and pneumonia vaccine at least every 5 years; moderate intensity exercise for up to 150 minutes weekly; and  sleep for at least 7 hours a day.  - she is  advised to maintain close follow up with [name], MD for primary care needs, as well as her other providers for optimal and coordinated care.   I spent  26  minutes in the care of the patient today including review of labs from CMP, Lipids, Thyroid Function, Hematology (current and previous including abstractions from other facilities);  face-to-face time discussing  her blood glucose readings/logs, discussing hypoglycemia and hyperglycemia episodes and symptoms, medications doses, her options of short and long term treatment based on the latest standards of care / guidelines;  discussion about incorporating lifestyle medicine;  and documenting the encounter. Risk reduction counseling performed per USPSTF guidelines to reduce  obesity and cardiovascular risk factors.     Please refer to Patient Instructions for Blood Glucose Monitoring and Insulin/Medications Dosing Guide"  in media tab for additional information. Please  also refer to " Patient Self Inventory" in the Media  tab for reviewed elements of pertinent patient history.  Kimberly Graves participated in the discussions, expressed understanding, and voiced agreement with the above plans.  All questions were answered to her satisfaction. she is encouraged to contact clinic should she have any questions or concerns prior to her return visit.   Follow up plan: - Return in about 6 months (around 04/13/2024) for F/U with Pre-visit Labs, Meter/CGM/Logs, A1c here.  Marquis Lunch, MD [facility] [address] [place], [place] [address] Phone: [phone]  Fax: [phone]    10/12/2023, 10:00 AM  This note was partially dictated with voice recognition software. Similar sounding words can be transcribed inadequately or may not  be corrected upon review.

## 2023-10-25 ENCOUNTER — Other Ambulatory Visit: Payer: Self-pay | Admitting: "Endocrinology

## 2023-10-25 DIAGNOSIS — E1165 Type 2 diabetes mellitus with hyperglycemia: Secondary | ICD-10-CM

## 2023-12-01 ENCOUNTER — Other Ambulatory Visit: Payer: Self-pay | Admitting: "Endocrinology

## 2023-12-01 DIAGNOSIS — E1165 Type 2 diabetes mellitus with hyperglycemia: Secondary | ICD-10-CM

## 2023-12-12 ENCOUNTER — Other Ambulatory Visit: Payer: Self-pay | Admitting: "Endocrinology

## 2023-12-12 ENCOUNTER — Other Ambulatory Visit: Payer: Self-pay | Admitting: Nurse Practitioner

## 2023-12-15 ENCOUNTER — Other Ambulatory Visit: Payer: Self-pay | Admitting: Nurse Practitioner

## 2023-12-15 ENCOUNTER — Other Ambulatory Visit: Payer: Self-pay | Admitting: "Endocrinology

## 2023-12-21 ENCOUNTER — Other Ambulatory Visit: Payer: Self-pay | Admitting: Nurse Practitioner

## 2023-12-21 ENCOUNTER — Other Ambulatory Visit: Payer: Self-pay | Admitting: "Endocrinology

## 2024-01-08 DEATH — deceased

## 2024-02-27 IMAGING — CT CT CERVICAL SPINE W/O CM
4 series · 14 of 33 positions shown, 17 images · non-contrast
Comparison: None.

CLINICAL DATA: Ataxia, cervical trauma.



[Series 4: c spine soft · axial · 0.34mm/px · 1 of 88 slices shown]
[im 15/88  soft-tissue]
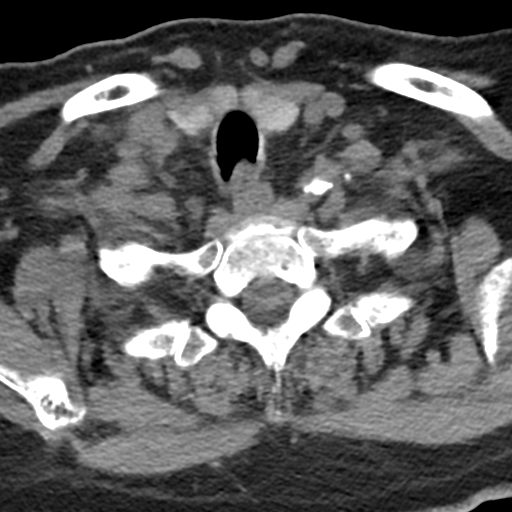

[Series 5: sagittal bone · sagittal · 0.28mm/px · 5 of 67 slices shown, 6 images]
[im 23/67  bone]
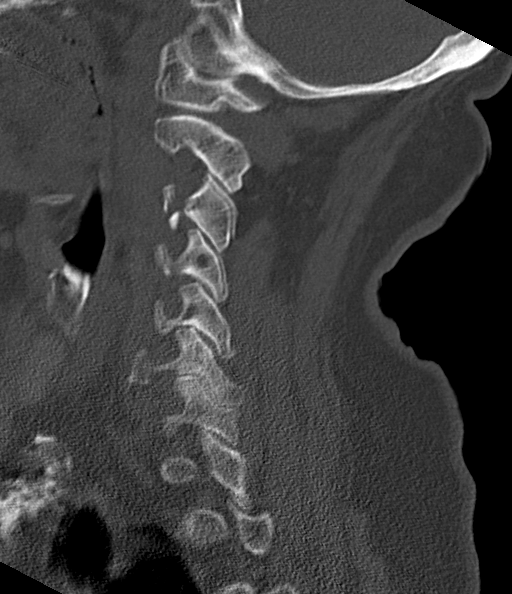
[im 28/67  bone]
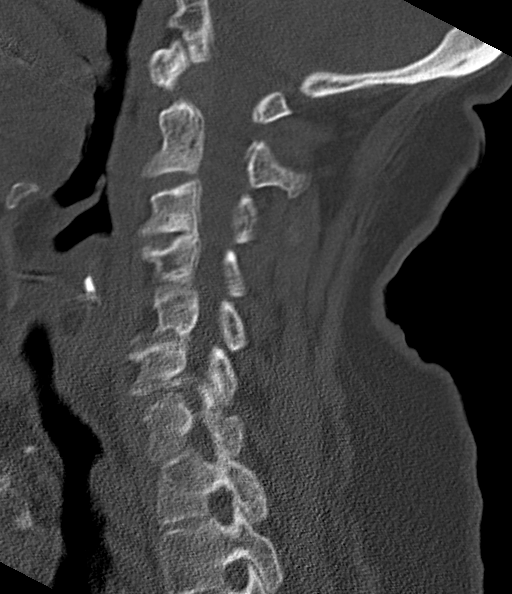
[im 34/67  soft-tissue]
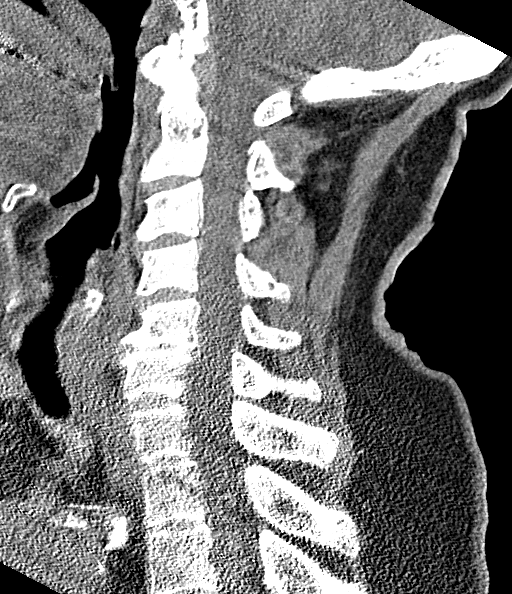
[im 34/67  bone]
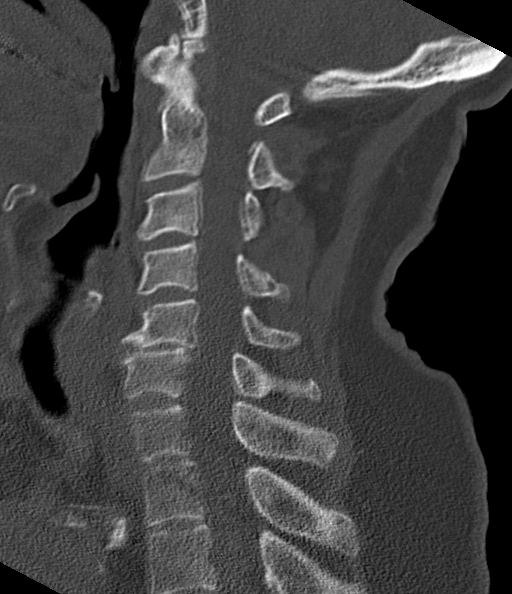
[im 39/67  bone]
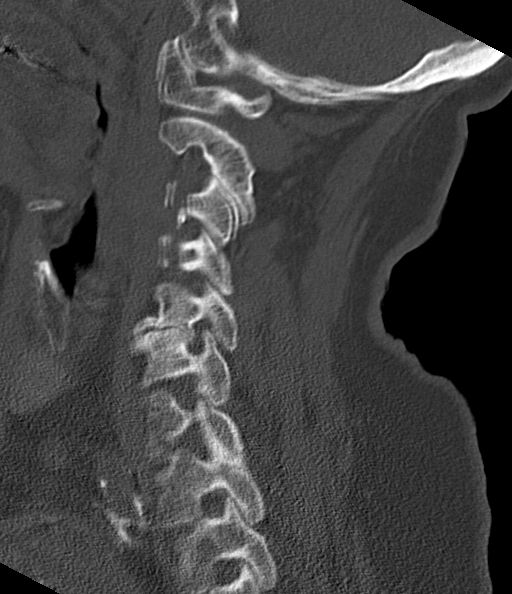
[im 45/67  bone]
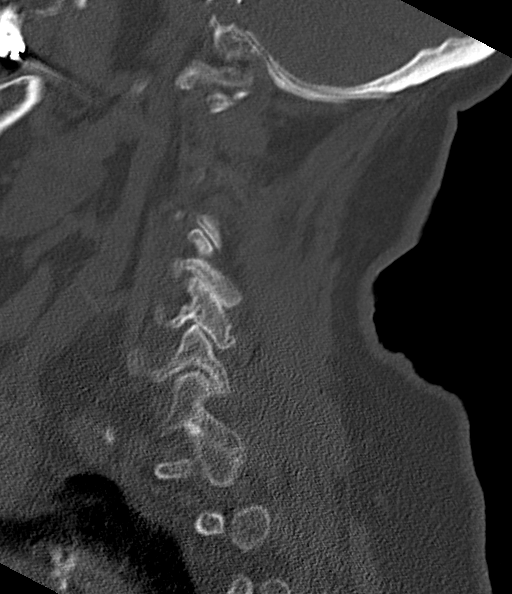

[Series 6: coronal bone · coronal · 0.26mm/px · 3 of 72 slices shown]
[im 20/72  bone]
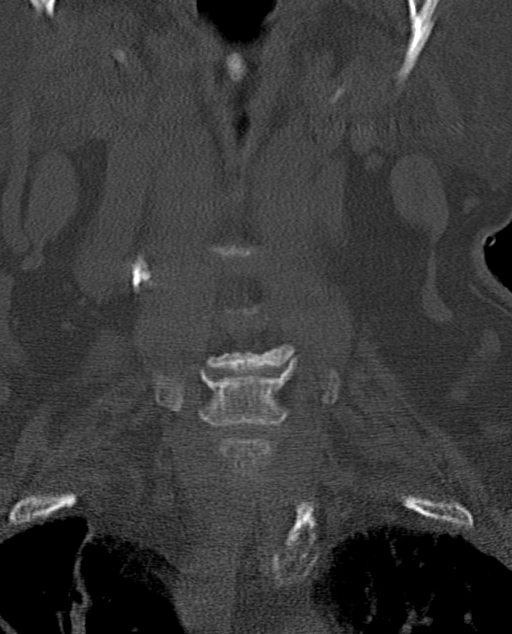
[im 31/72  bone]
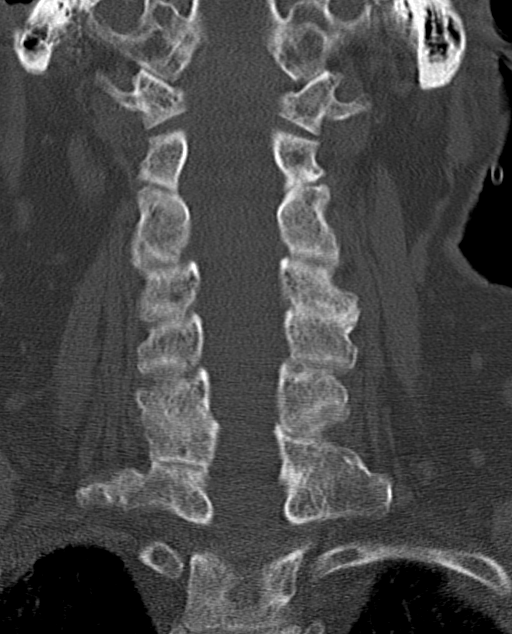
[im 41/72  bone]
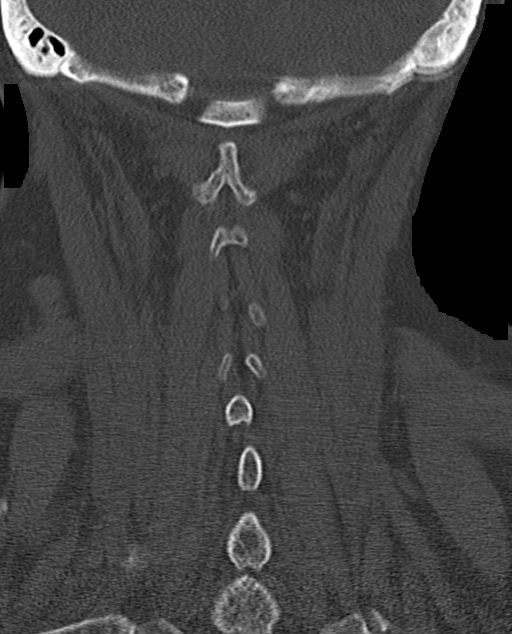

[Series 7: orthogonal axials · axial · 0.21mm/px · z∈[-772,-674]mm · 5 of 77 slices shown, 7 images]
[im 13/77  soft-tissue]
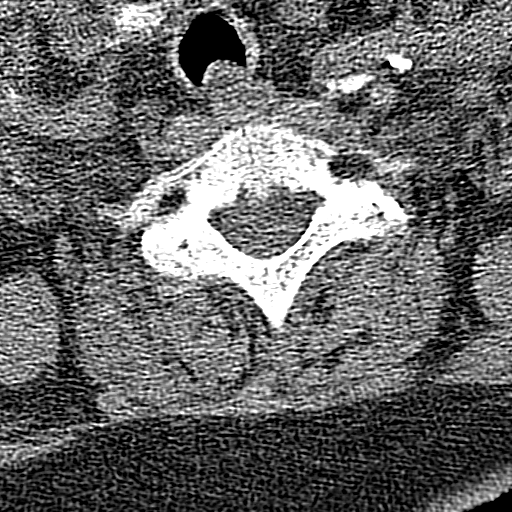
[im 13/77  bone]
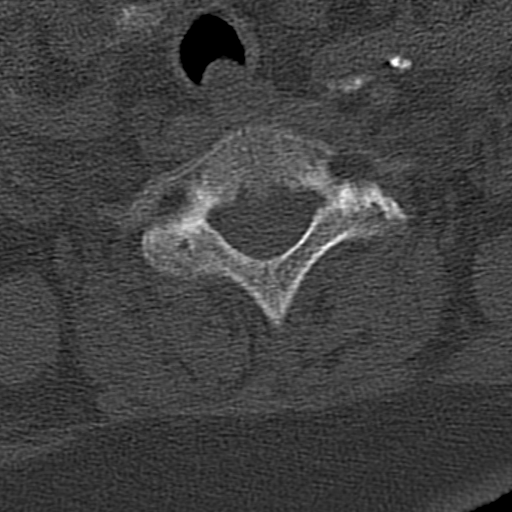
[im 26/77  bone]
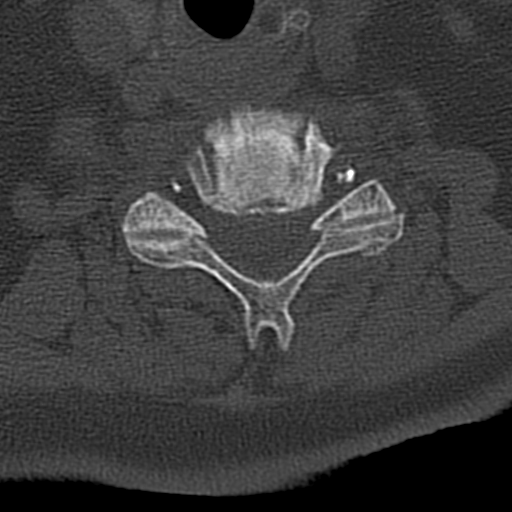
[im 39/77  bone]
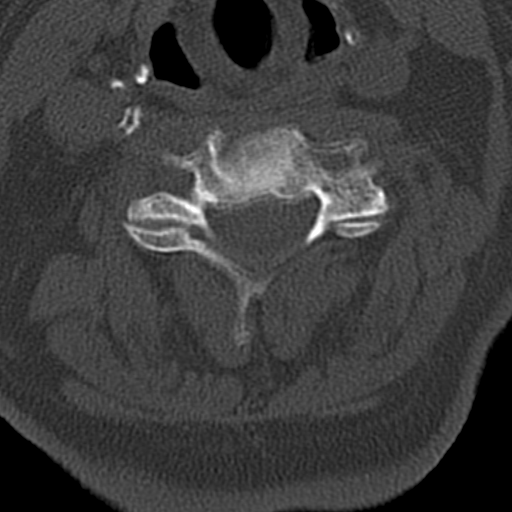
[im 51/77  bone]
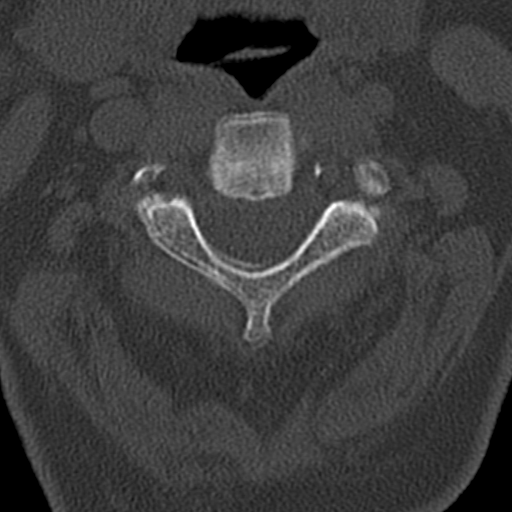
[im 64/77  soft-tissue]
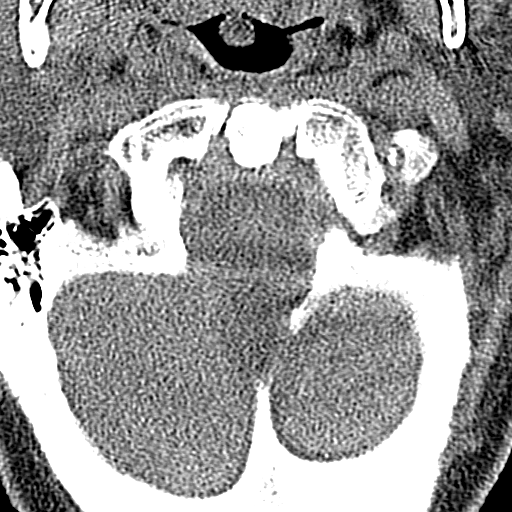
[im 64/77  bone]
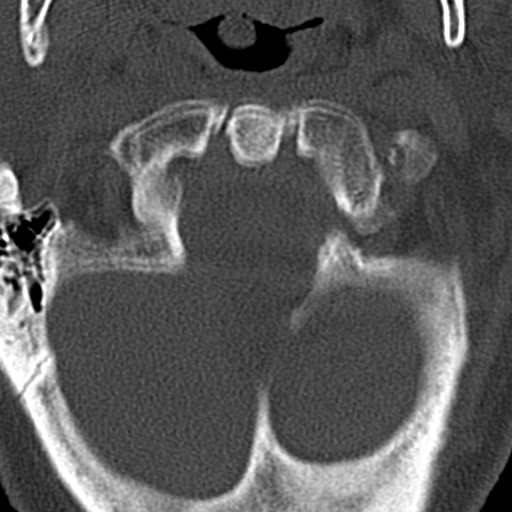

[14 of 33 positions shown; findings below may reference images not displayed]

FINDINGS: Alignment: Normal.

Skull base and vertebrae: No acute fracture. No primary bone lesion
or focal pathologic process.

Soft tissues and spinal canal: No prevertebral fluid or swelling. No
visible canal hematoma.

Disc levels: There is disc space narrowing at C5-C6 with endplate
osteophyte formation compatible with degenerative change. There is
mild left-sided neural foraminal stenosis at this level secondary to
uncovertebral spurring. There is also disc bulge at this level
causing mild central canal stenosis.

Upper chest: There is scarring in the lung apices.

Other: There are atherosclerotic calcifications of the bilateral
carotid artery bifurcations.
IMPRESSION: No acute fracture or traumatic subluxation of the cervical spine.

## 2024-02-27 IMAGING — CT CT HEAD W/O CM
4 series · 16 of 47 positions shown, 18 images · non-contrast
Comparison: None.

CLINICAL DATA: Head trauma, intracranial arterial injury suspected



[Series 2: head w o · axial · 0.46mm/px · z∈[-612,-492]mm · 7 of 32 slices shown, 9 images]
[im 4/32  brain]
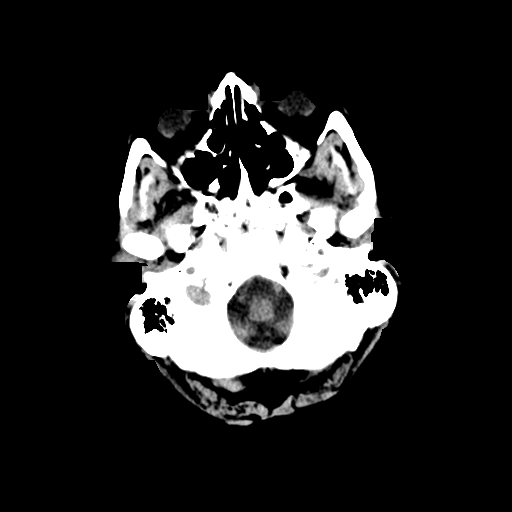
[im 4/32  bone]
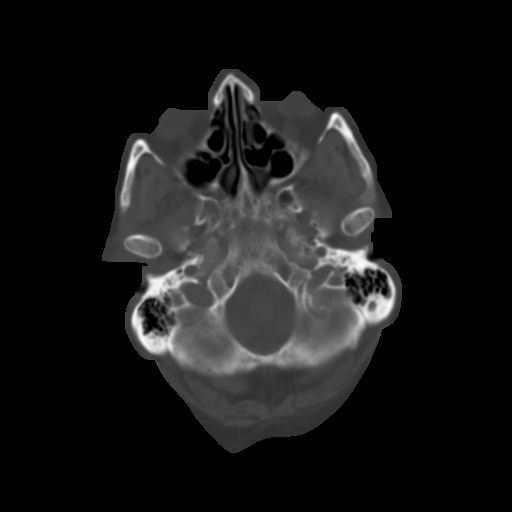
[im 8/32  brain]
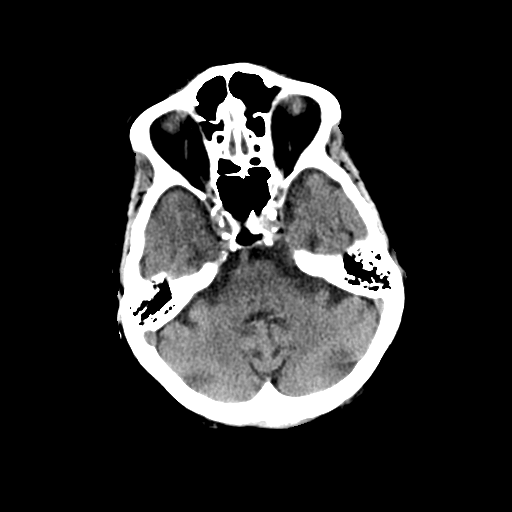
[im 12/32  brain]
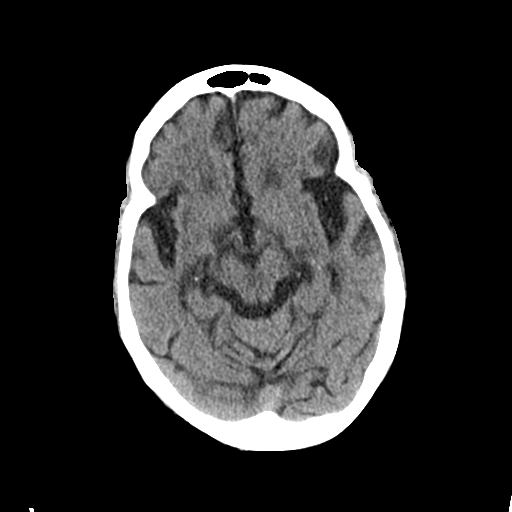
[im 16/32  brain]
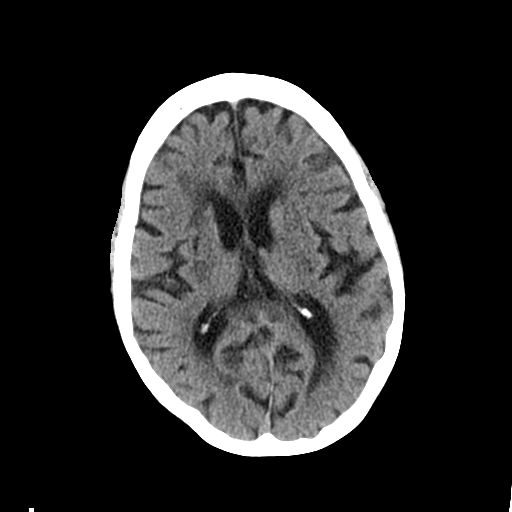
[im 20/32  brain]
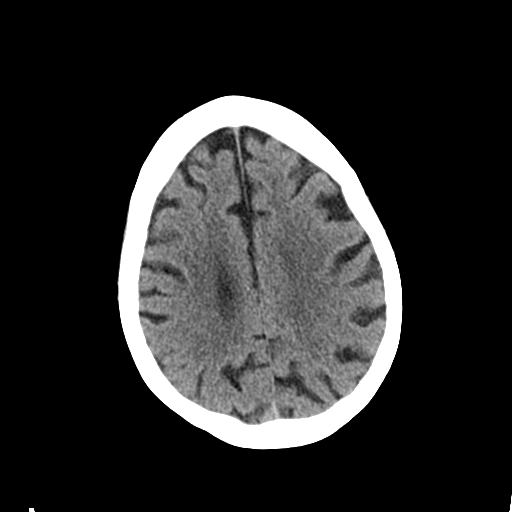
[im 20/32  bone]
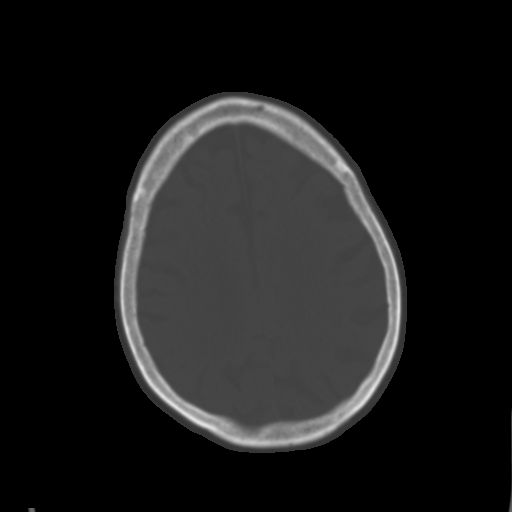
[im 24/32  brain]
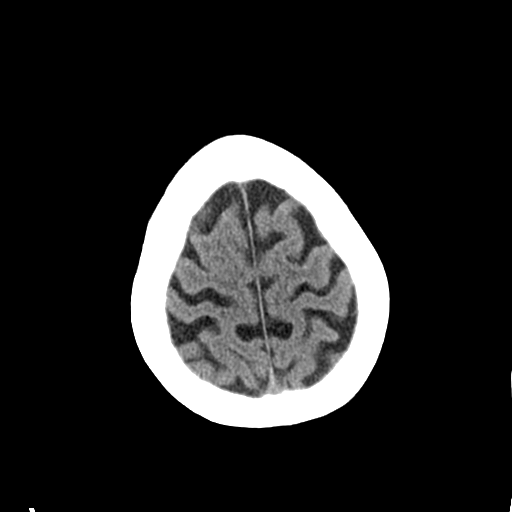
[im 28/32  brain]
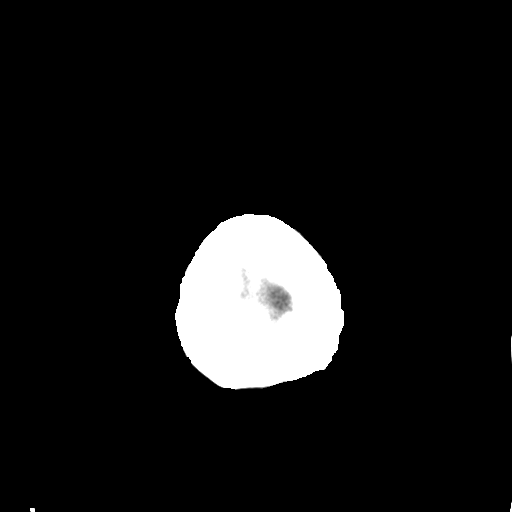

[Series 3: head bone · axial · 0.46mm/px · z∈[-613,-581]mm · 3 of 80 slices shown]
[im 8/80  bone]
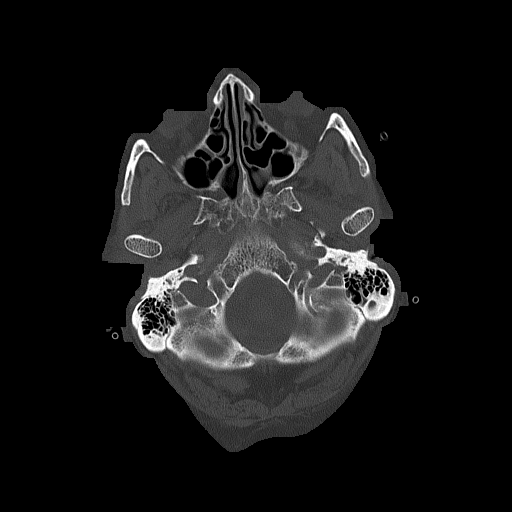
[im 16/80  bone]
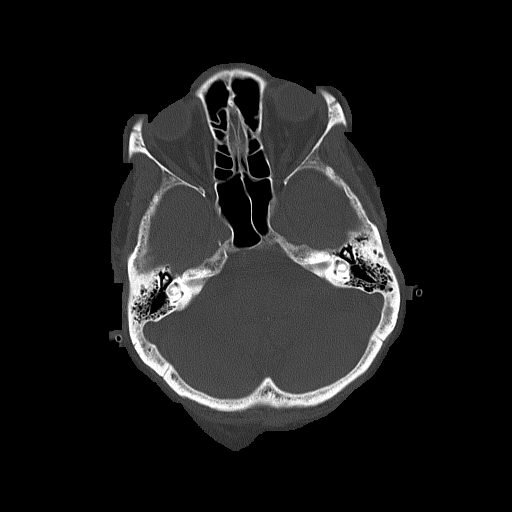
[im 24/80  bone]
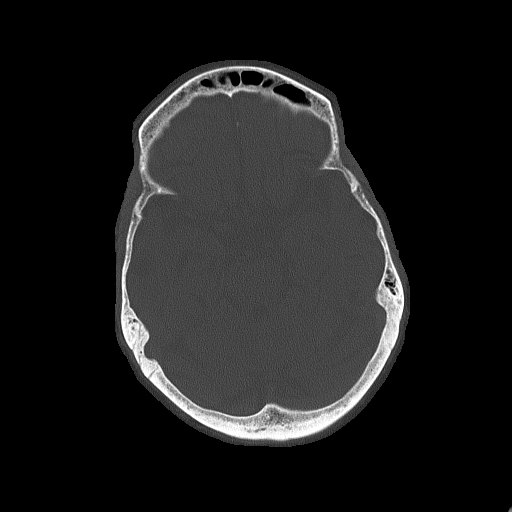

[Series 4: coronal soft · coronal · 0.30mm/px · 3 of 68 slices shown]
[im 23/68  brain]
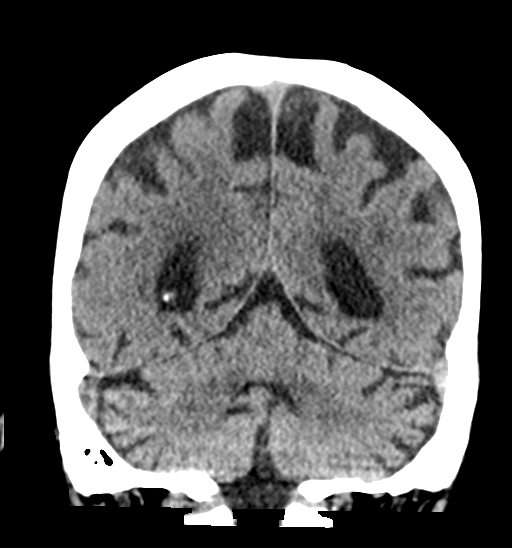
[im 30/68  brain]
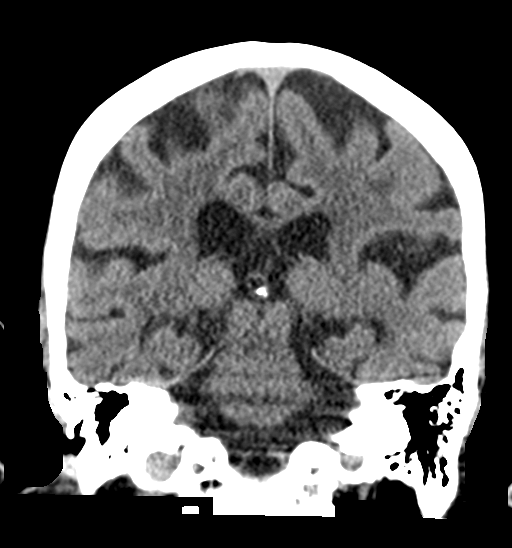
[im 38/68  brain]
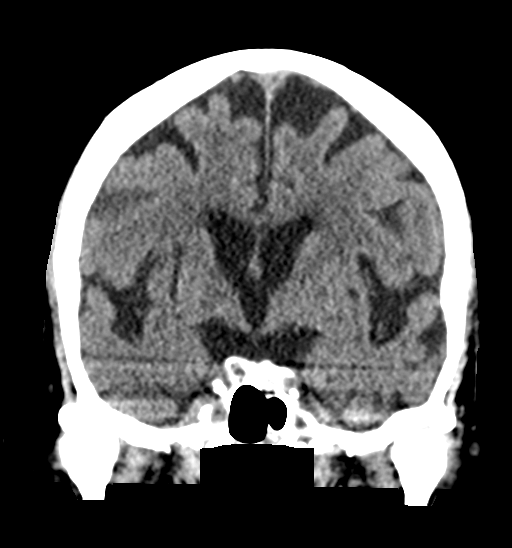

[Series 5: sagittal soft · sagittal · 0.33mm/px · 3 of 53 slices shown]
[im 18/53  brain]
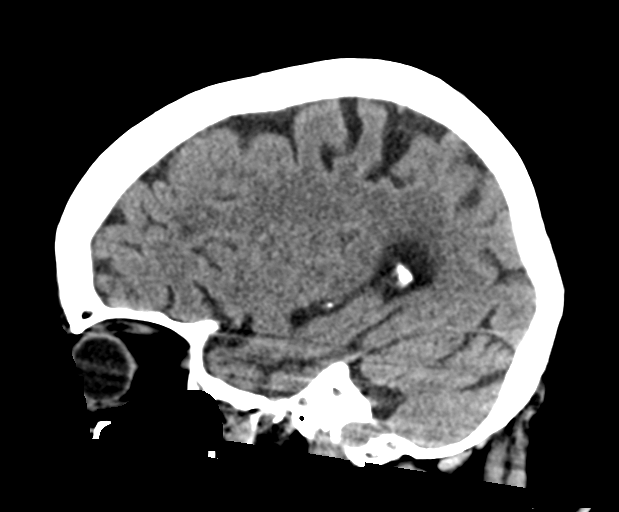
[im 27/53  brain]
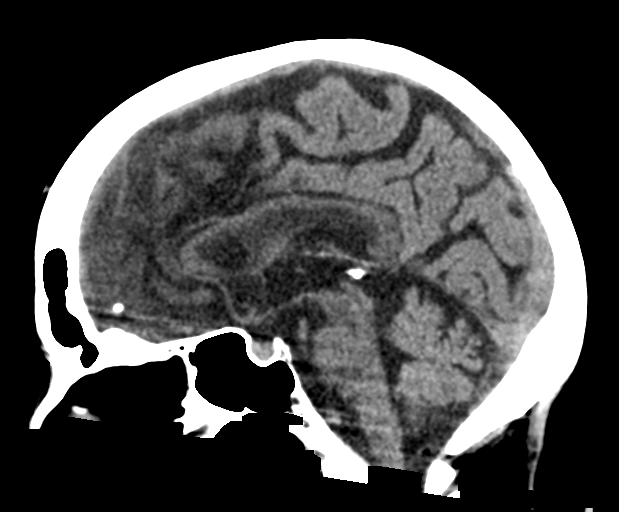
[im 35/53  brain]
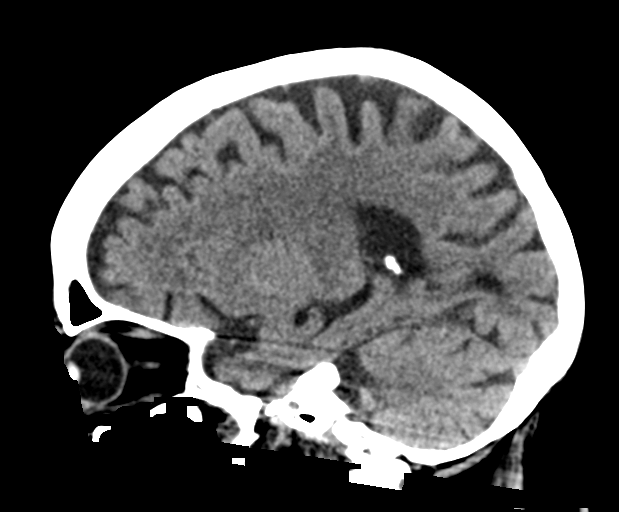

[16 of 47 positions shown; findings below may reference images not displayed]

FINDINGS: Brain: No evidence of acute infarction, hemorrhage, hydrocephalus,
extra-axial collection or mass lesion/mass effect. Patchy white
matter hypoattenuation, nonspecific but compatible with chronic
microvascular ischemic disease. Cerebral atrophy.

Vascular: No evidence of a hyperdense vessel. Calcific intracranial
atherosclerosis.

Skull: No acute fracture.

Sinuses/Orbits: Visualized sinuses are clear. No acute orbital
findings.

Other: No mastoid effusions.
IMPRESSION: 1. No evidence of acute intracranial abnormality.
2. Chronic microvascular disease and cerebral atrophy.

## 2024-02-28 IMAGING — US US CAROTID DUPLEX BILAT
1 series · 13 of 24 positions shown · non-contrast
Comparison: None.

CLINICAL DATA: Fall.  Dizziness.  Syncope.

EXAM:
BILATERAL CAROTID DUPLEX ULTRASOUND
TECHNIQUE: Gray scale imaging, color Doppler and duplex ultrasound were
performed of bilateral carotid and vertebral arteries in the neck.

[Series 1: us carotid bilateral · 13 of 69 slices shown]
[im 1/69]
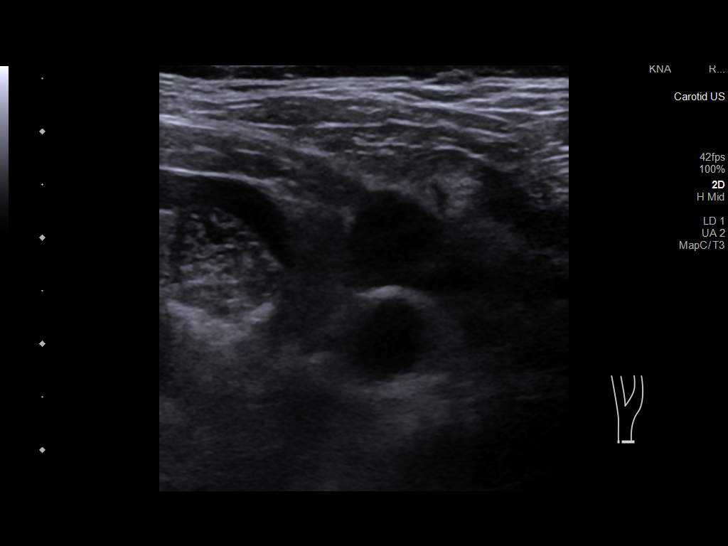
[im 6/69]
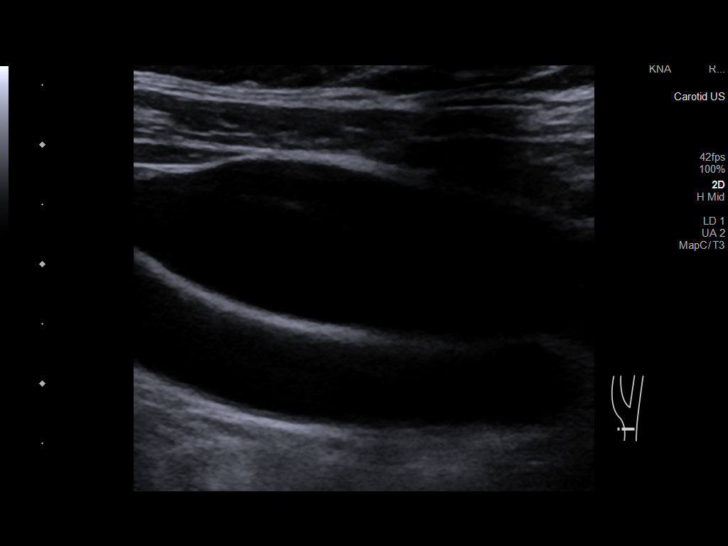
[im 12/69]
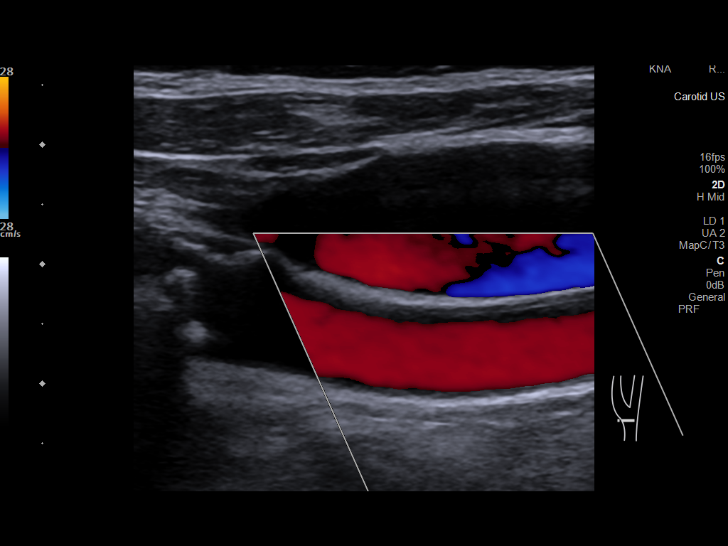
[im 18/69]
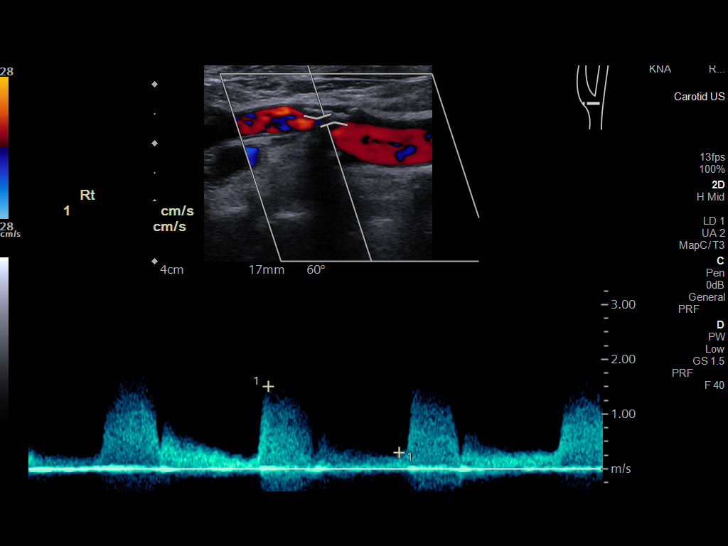
[im 24/69]
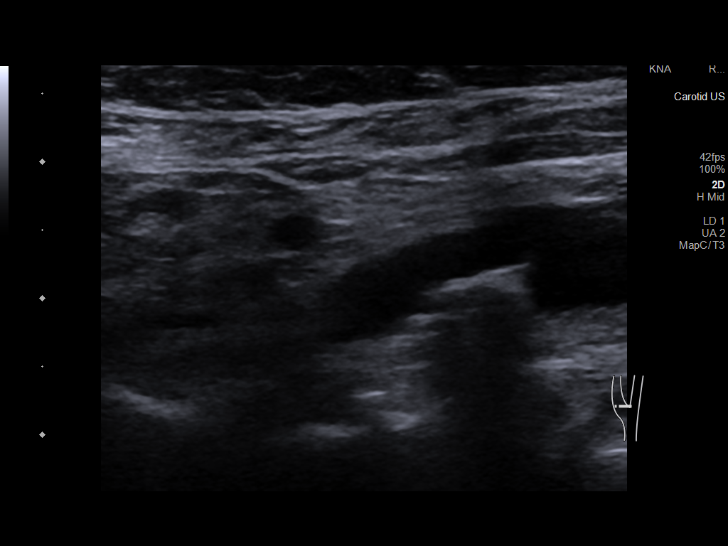
[im 30/69]
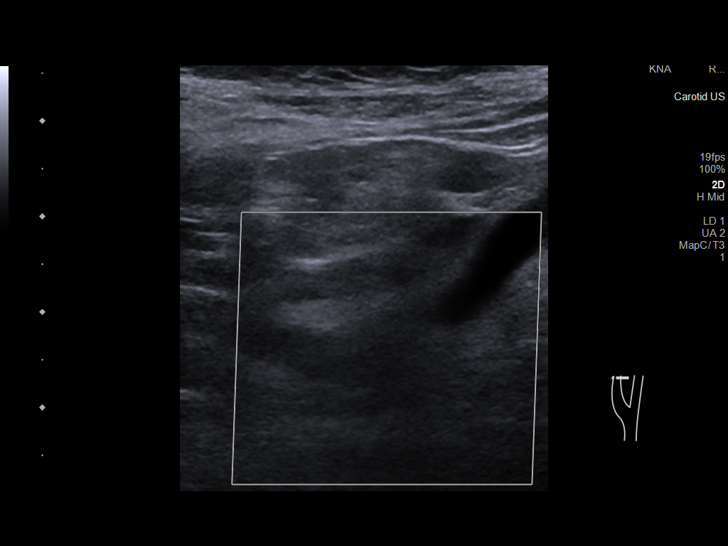
[im 36/69]
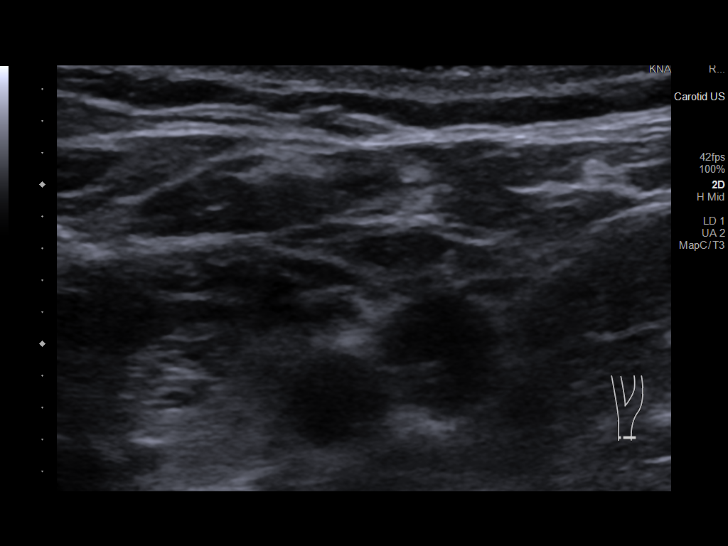
[im 39/69]
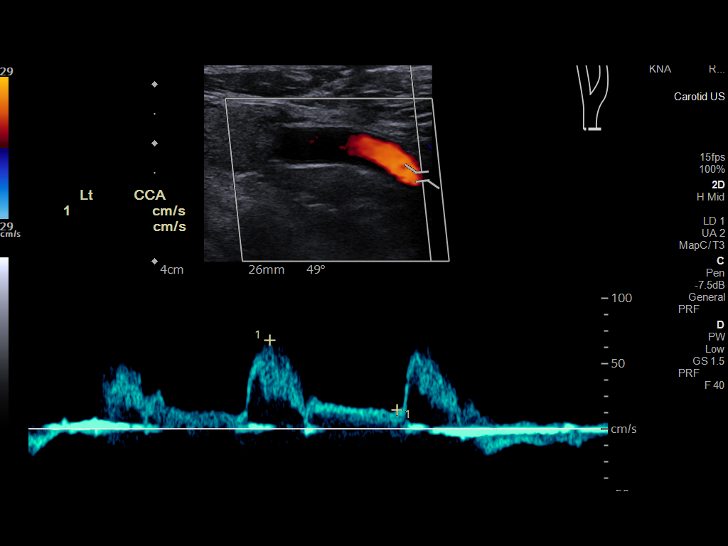
[im 45/69]
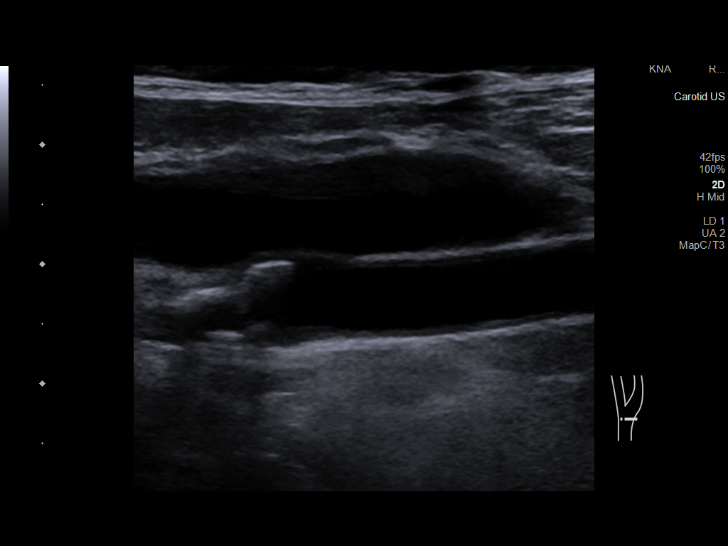
[im 51/69]
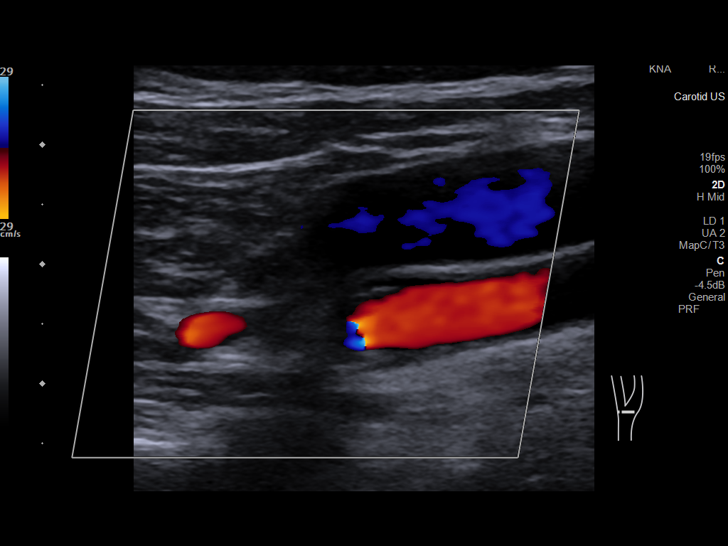
[im 57/69]
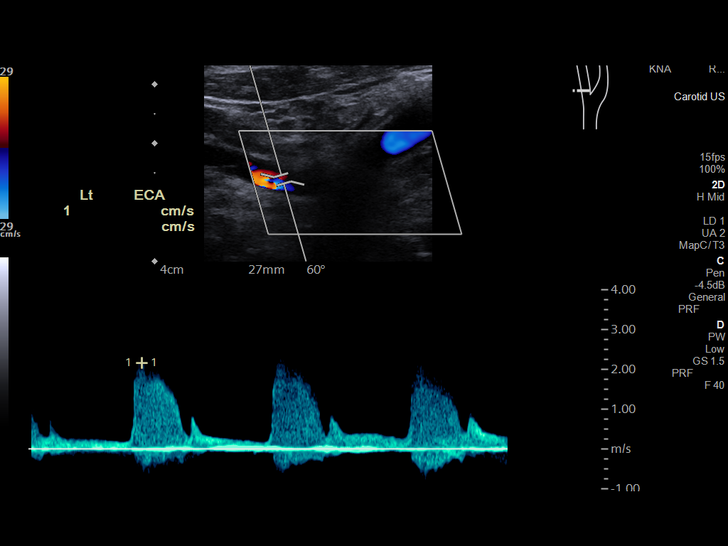
[im 63/69]
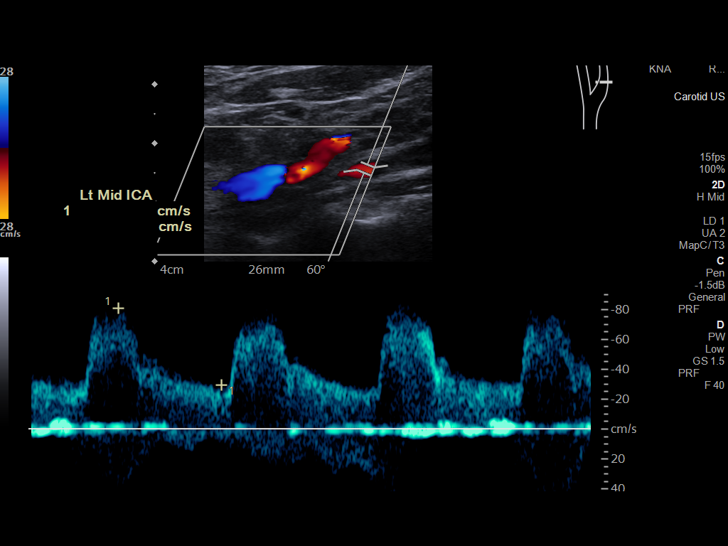
[im 69/69]
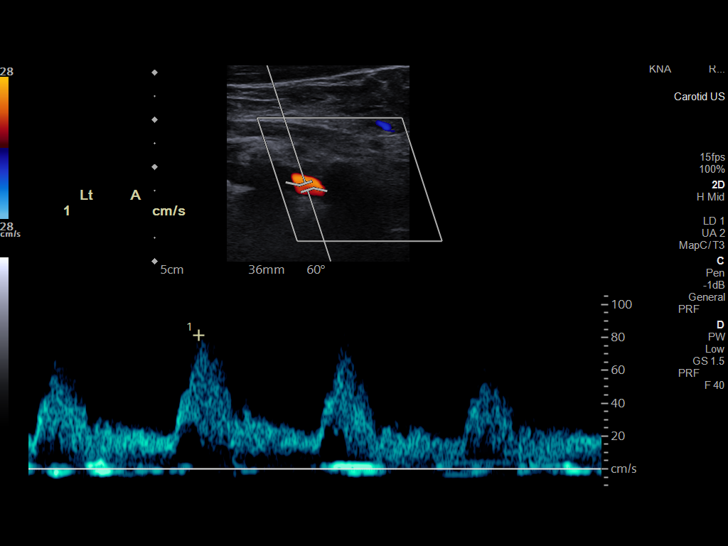

[13 of 24 positions shown; findings below may reference images not displayed]

FINDINGS: Criteria: Quantification of carotid stenosis is based on velocity
parameters that correlate the residual internal carotid diameter
with NASCET-based stenosis levels, using the diameter of the distal
internal carotid lumen as the denominator for stenosis measurement.

The following velocity measurements were obtained:

RIGHT

ICA: 86/28 cm/sec

CCA: 98/13 cm/sec

SYSTOLIC ICA/CCA RATIO:

ECA: 151 cm/sec

LEFT

ICA: 125/38 cm/sec

CCA: 67/17 cm/sec

SYSTOLIC ICA/CCA RATIO:

ECA: 216 cm/sec

RIGHT CAROTID ARTERY: Calcified plaque is identified in the carotid
bulb.

RIGHT VERTEBRAL ARTERY:  Antegrade flow.

LEFT CAROTID ARTERY:  No definable plaque identified.

LEFT VERTEBRAL ARTERY:  Antegrade flow.
IMPRESSION: 1. Calcified plaque is identified in the carotid bulb.
2. No definable plaque seen in the bilateral internal carotid
arteries. No significant stenosis is seen in the bilateral internal
carotid arteries.
3. Antegrade flow in both vertebral arteries.

## 2024-03-19 ENCOUNTER — Telehealth: Payer: Self-pay | Admitting: "Endocrinology

## 2024-03-19 NOTE — Telephone Encounter (Signed)
 Left VM for her daughter to reschedule her appt on 9/5.

## 2024-04-13 ENCOUNTER — Ambulatory Visit: Admitting: "Endocrinology
